# Patient Record
Sex: Female | Born: 1935 | Race: White | Hispanic: No | Smoking: Never smoker
Health system: Southern US, Community
[De-identification: ages and names within clinical notes are randomized; demographics above are authoritative.]

## PROBLEM LIST (undated history)

## (undated) DIAGNOSIS — F0280 Dementia in other diseases classified elsewhere without behavioral disturbance: Secondary | ICD-10-CM

## (undated) DIAGNOSIS — C4330 Malignant melanoma of unspecified part of face: Secondary | ICD-10-CM

## (undated) DIAGNOSIS — C801 Malignant (primary) neoplasm, unspecified: Secondary | ICD-10-CM

## (undated) DIAGNOSIS — R739 Hyperglycemia, unspecified: Secondary | ICD-10-CM

## (undated) DIAGNOSIS — I10 Essential (primary) hypertension: Secondary | ICD-10-CM

## (undated) DIAGNOSIS — F329 Major depressive disorder, single episode, unspecified: Secondary | ICD-10-CM

## (undated) DIAGNOSIS — M545 Low back pain, unspecified: Secondary | ICD-10-CM

## (undated) DIAGNOSIS — G43909 Migraine, unspecified, not intractable, without status migrainosus: Secondary | ICD-10-CM

## (undated) DIAGNOSIS — E039 Hypothyroidism, unspecified: Secondary | ICD-10-CM

## (undated) DIAGNOSIS — E079 Disorder of thyroid, unspecified: Secondary | ICD-10-CM

## (undated) DIAGNOSIS — G309 Alzheimer's disease, unspecified: Secondary | ICD-10-CM

## (undated) DIAGNOSIS — K648 Other hemorrhoids: Secondary | ICD-10-CM

## (undated) DIAGNOSIS — C679 Malignant neoplasm of bladder, unspecified: Secondary | ICD-10-CM

## (undated) DIAGNOSIS — M1990 Unspecified osteoarthritis, unspecified site: Secondary | ICD-10-CM

## (undated) DIAGNOSIS — R42 Dizziness and giddiness: Secondary | ICD-10-CM

## (undated) DIAGNOSIS — E785 Hyperlipidemia, unspecified: Secondary | ICD-10-CM

## (undated) DIAGNOSIS — I4891 Unspecified atrial fibrillation: Secondary | ICD-10-CM

## (undated) DIAGNOSIS — F32A Depression, unspecified: Secondary | ICD-10-CM

## (undated) DIAGNOSIS — F0390 Unspecified dementia without behavioral disturbance: Secondary | ICD-10-CM

## (undated) HISTORY — DX: Unspecified osteoarthritis, unspecified site: M19.90

## (undated) HISTORY — PX: OTHER SURGICAL HISTORY: SHX169

## (undated) HISTORY — DX: Hyperglycemia, unspecified: R73.9

## (undated) HISTORY — PX: EYE SURGERY: SHX253

## (undated) HISTORY — DX: Malignant neoplasm of bladder, unspecified: C67.9

## (undated) HISTORY — DX: Depression, unspecified: F32.A

## (undated) HISTORY — DX: Major depressive disorder, single episode, unspecified: F32.9

## (undated) HISTORY — DX: Hyperlipidemia, unspecified: E78.5

## (undated) HISTORY — PX: BLADDER SURGERY: SHX569

## (undated) HISTORY — DX: Other hemorrhoids: K64.8

## (undated) HISTORY — PX: CHOLECYSTECTOMY: SHX55

## (undated) HISTORY — DX: Alzheimer's disease, unspecified: G30.9

## (undated) HISTORY — DX: Migraine, unspecified, not intractable, without status migrainosus: G43.909

## (undated) HISTORY — PX: MELANOMA EXCISION: SHX5266

## (undated) HISTORY — DX: Dementia in other diseases classified elsewhere, unspecified severity, without behavioral disturbance, psychotic disturbance, mood disturbance, and anxiety: F02.80

---
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
## 2001-09-16 ENCOUNTER — Encounter: Payer: Self-pay | Admitting: Internal Medicine

## 2001-09-16 ENCOUNTER — Ambulatory Visit (HOSPITAL_COMMUNITY): Admission: RE | Admit: 2001-09-16 | Discharge: 2001-09-16 | Payer: Self-pay | Admitting: Internal Medicine

## 2001-12-09 ENCOUNTER — Emergency Department (HOSPITAL_COMMUNITY): Admission: EM | Admit: 2001-12-09 | Discharge: 2001-12-09 | Payer: Self-pay | Admitting: Emergency Medicine

## 2001-12-09 ENCOUNTER — Encounter: Payer: Self-pay | Admitting: Emergency Medicine

## 2002-02-19 ENCOUNTER — Encounter: Payer: Self-pay | Admitting: Internal Medicine

## 2002-02-19 ENCOUNTER — Ambulatory Visit (HOSPITAL_COMMUNITY): Admission: RE | Admit: 2002-02-19 | Discharge: 2002-02-19 | Payer: Self-pay | Admitting: Internal Medicine

## 2002-03-12 ENCOUNTER — Emergency Department (HOSPITAL_COMMUNITY): Admission: EM | Admit: 2002-03-12 | Discharge: 2002-03-12 | Payer: Self-pay | Admitting: Emergency Medicine

## 2002-04-08 ENCOUNTER — Encounter: Payer: Self-pay | Admitting: Neurosurgery

## 2002-04-08 ENCOUNTER — Encounter: Admission: RE | Admit: 2002-04-08 | Discharge: 2002-04-08 | Payer: Self-pay | Admitting: Neurosurgery

## 2002-04-23 ENCOUNTER — Encounter: Admission: RE | Admit: 2002-04-23 | Discharge: 2002-04-23 | Payer: Self-pay | Admitting: Neurosurgery

## 2002-04-23 ENCOUNTER — Encounter: Payer: Self-pay | Admitting: Neurosurgery

## 2002-05-07 ENCOUNTER — Encounter: Payer: Self-pay | Admitting: Neurosurgery

## 2002-05-07 ENCOUNTER — Encounter: Admission: RE | Admit: 2002-05-07 | Discharge: 2002-05-07 | Payer: Self-pay | Admitting: Neurosurgery

## 2003-01-12 ENCOUNTER — Encounter: Admission: RE | Admit: 2003-01-12 | Discharge: 2003-01-12 | Payer: Self-pay | Admitting: Neurosurgery

## 2003-01-12 ENCOUNTER — Encounter: Payer: Self-pay | Admitting: Neurosurgery

## 2003-01-26 ENCOUNTER — Encounter: Admission: RE | Admit: 2003-01-26 | Discharge: 2003-01-26 | Payer: Self-pay | Admitting: Neurosurgery

## 2003-01-26 ENCOUNTER — Encounter: Payer: Self-pay | Admitting: Neurosurgery

## 2003-02-17 ENCOUNTER — Ambulatory Visit (HOSPITAL_COMMUNITY): Admission: RE | Admit: 2003-02-17 | Discharge: 2003-02-17 | Payer: Self-pay | Admitting: Internal Medicine

## 2003-02-17 ENCOUNTER — Encounter: Payer: Self-pay | Admitting: Internal Medicine

## 2003-11-22 ENCOUNTER — Ambulatory Visit (HOSPITAL_COMMUNITY): Admission: RE | Admit: 2003-11-22 | Discharge: 2003-11-22 | Payer: Self-pay | Admitting: Internal Medicine

## 2003-11-22 ENCOUNTER — Encounter: Payer: Self-pay | Admitting: Physician Assistant

## 2005-05-10 ENCOUNTER — Ambulatory Visit (HOSPITAL_COMMUNITY): Admission: RE | Admit: 2005-05-10 | Discharge: 2005-05-10 | Payer: Self-pay | Admitting: Family Medicine

## 2005-10-05 ENCOUNTER — Ambulatory Visit (HOSPITAL_COMMUNITY): Admission: RE | Admit: 2005-10-05 | Discharge: 2005-10-06 | Payer: Self-pay | Admitting: Urology

## 2005-10-05 ENCOUNTER — Encounter (INDEPENDENT_AMBULATORY_CARE_PROVIDER_SITE_OTHER): Payer: Self-pay | Admitting: Specialist

## 2006-08-07 ENCOUNTER — Ambulatory Visit (HOSPITAL_COMMUNITY): Admission: RE | Admit: 2006-08-07 | Discharge: 2006-08-07 | Payer: Self-pay | Admitting: Family Medicine

## 2006-08-16 ENCOUNTER — Ambulatory Visit (HOSPITAL_COMMUNITY): Admission: RE | Admit: 2006-08-16 | Discharge: 2006-08-16 | Payer: Self-pay | Admitting: General Surgery

## 2006-10-08 ENCOUNTER — Ambulatory Visit (HOSPITAL_COMMUNITY): Admission: RE | Admit: 2006-10-08 | Discharge: 2006-10-09 | Payer: Self-pay | Admitting: General Surgery

## 2006-10-08 ENCOUNTER — Encounter (INDEPENDENT_AMBULATORY_CARE_PROVIDER_SITE_OTHER): Payer: Self-pay | Admitting: Specialist

## 2007-08-12 ENCOUNTER — Ambulatory Visit (HOSPITAL_COMMUNITY): Admission: RE | Admit: 2007-08-12 | Discharge: 2007-08-12 | Payer: Self-pay | Admitting: Family Medicine

## 2007-10-04 ENCOUNTER — Emergency Department (HOSPITAL_COMMUNITY): Admission: EM | Admit: 2007-10-04 | Discharge: 2007-10-04 | Payer: Self-pay | Admitting: Emergency Medicine

## 2007-10-17 ENCOUNTER — Ambulatory Visit: Payer: Self-pay | Admitting: Internal Medicine

## 2007-10-17 DIAGNOSIS — E039 Hypothyroidism, unspecified: Secondary | ICD-10-CM | POA: Insufficient documentation

## 2007-10-17 DIAGNOSIS — Z8551 Personal history of malignant neoplasm of bladder: Secondary | ICD-10-CM | POA: Insufficient documentation

## 2007-10-17 DIAGNOSIS — I868 Varicose veins of other specified sites: Secondary | ICD-10-CM

## 2007-10-17 DIAGNOSIS — Z8669 Personal history of other diseases of the nervous system and sense organs: Secondary | ICD-10-CM | POA: Insufficient documentation

## 2007-10-17 DIAGNOSIS — E785 Hyperlipidemia, unspecified: Secondary | ICD-10-CM

## 2007-10-17 DIAGNOSIS — Z85828 Personal history of other malignant neoplasm of skin: Secondary | ICD-10-CM | POA: Insufficient documentation

## 2007-10-17 DIAGNOSIS — R011 Cardiac murmur, unspecified: Secondary | ICD-10-CM

## 2007-10-17 DIAGNOSIS — J45909 Unspecified asthma, uncomplicated: Secondary | ICD-10-CM | POA: Insufficient documentation

## 2007-10-17 DIAGNOSIS — F411 Generalized anxiety disorder: Secondary | ICD-10-CM | POA: Insufficient documentation

## 2007-10-17 DIAGNOSIS — N318 Other neuromuscular dysfunction of bladder: Secondary | ICD-10-CM

## 2007-10-17 DIAGNOSIS — I10 Essential (primary) hypertension: Secondary | ICD-10-CM | POA: Insufficient documentation

## 2007-10-17 DIAGNOSIS — F329 Major depressive disorder, single episode, unspecified: Secondary | ICD-10-CM

## 2007-11-14 ENCOUNTER — Ambulatory Visit: Payer: Self-pay | Admitting: Internal Medicine

## 2007-11-19 ENCOUNTER — Encounter (INDEPENDENT_AMBULATORY_CARE_PROVIDER_SITE_OTHER): Payer: Self-pay | Admitting: Internal Medicine

## 2007-12-25 ENCOUNTER — Ambulatory Visit: Payer: Self-pay | Admitting: Internal Medicine

## 2007-12-25 DIAGNOSIS — G43909 Migraine, unspecified, not intractable, without status migrainosus: Secondary | ICD-10-CM | POA: Insufficient documentation

## 2008-02-23 ENCOUNTER — Ambulatory Visit: Payer: Self-pay | Admitting: Internal Medicine

## 2008-02-23 LAB — CONVERTED CEMR LAB
Bilirubin Urine: NEGATIVE
Blood in Urine, dipstick: NEGATIVE
Glucose, Urine, Semiquant: NEGATIVE
Ketones, urine, test strip: NEGATIVE
Nitrite: NEGATIVE

## 2008-02-24 LAB — CONVERTED CEMR LAB
Albumin: 4.2 g/dL (ref 3.5–5.2)
CO2: 23 meq/L (ref 19–32)
Cholesterol: 164 mg/dL (ref 0–200)
Creatinine, Ser: 0.88 mg/dL (ref 0.40–1.20)
Eosinophils Absolute: 0.1 10*3/uL (ref 0.0–0.7)
Glucose, Bld: 126 mg/dL — ABNORMAL HIGH (ref 70–99)
HDL: 47 mg/dL (ref >39)
Hemoglobin: 13.3 g/dL (ref 12.0–15.0)
Lymphocytes Relative: 29 % (ref 12–46)
Lymphs Abs: 1.6 10*3/uL (ref 0.7–4.0)
MCHC: 32.8 g/dL (ref 30.0–36.0)
MCV: 88.8 fL (ref 78.0–100.0)
Monocytes Relative: 7 % (ref 3–12)
Platelets: 196 10*3/uL (ref 150–400)
RDW: 13.5 % (ref 11.5–15.5)
Sodium: 141 meq/L (ref 135–145)
Total Bilirubin: 0.7 mg/dL (ref 0.3–1.2)
Total CHOL/HDL Ratio: 3.5
Total Protein: 7.3 g/dL (ref 6.0–8.3)
Triglycerides: 212 mg/dL — ABNORMAL HIGH (ref <150)
WBC: 5.6 10*3/uL (ref 4.0–10.5)

## 2008-03-15 ENCOUNTER — Encounter (INDEPENDENT_AMBULATORY_CARE_PROVIDER_SITE_OTHER): Payer: Self-pay | Admitting: Internal Medicine

## 2008-03-19 LAB — CONVERTED CEMR LAB: Glucose, Bld: 149 mg/dL — ABNORMAL HIGH (ref 70–99)

## 2008-03-25 ENCOUNTER — Ambulatory Visit: Payer: Self-pay | Admitting: Internal Medicine

## 2008-03-25 DIAGNOSIS — E119 Type 2 diabetes mellitus without complications: Secondary | ICD-10-CM

## 2008-03-25 LAB — CONVERTED CEMR LAB: Hgb A1c MFr Bld: 6.4 %

## 2008-04-01 ENCOUNTER — Encounter (INDEPENDENT_AMBULATORY_CARE_PROVIDER_SITE_OTHER): Payer: Self-pay | Admitting: Internal Medicine

## 2008-05-06 ENCOUNTER — Ambulatory Visit: Payer: Self-pay | Admitting: Internal Medicine

## 2008-06-17 ENCOUNTER — Ambulatory Visit: Payer: Self-pay | Admitting: Internal Medicine

## 2008-06-17 LAB — CONVERTED CEMR LAB
Creatinine, Urine: 46.4 mg/dL
Hgb A1c MFr Bld: 6.2 %

## 2008-08-29 ENCOUNTER — Telehealth (INDEPENDENT_AMBULATORY_CARE_PROVIDER_SITE_OTHER): Payer: Self-pay | Admitting: Internal Medicine

## 2008-08-30 ENCOUNTER — Ambulatory Visit: Payer: Self-pay | Admitting: Internal Medicine

## 2008-08-30 DIAGNOSIS — K6289 Other specified diseases of anus and rectum: Secondary | ICD-10-CM | POA: Insufficient documentation

## 2008-09-16 ENCOUNTER — Ambulatory Visit: Payer: Self-pay | Admitting: Internal Medicine

## 2008-09-16 DIAGNOSIS — F068 Other specified mental disorders due to known physiological condition: Secondary | ICD-10-CM

## 2008-09-16 LAB — CONVERTED CEMR LAB
Blood Glucose, Fingerstick: 108
Hgb A1c MFr Bld: 6.5 %

## 2008-09-17 LAB — CONVERTED CEMR LAB
ALT: 19 units/L (ref 0–35)
AST: 22 units/L (ref 0–37)
BUN: 21 mg/dL (ref 6–23)
Creatinine, Ser: 0.97 mg/dL (ref 0.40–1.20)

## 2008-11-12 ENCOUNTER — Telehealth (INDEPENDENT_AMBULATORY_CARE_PROVIDER_SITE_OTHER): Payer: Self-pay | Admitting: Internal Medicine

## 2008-12-14 ENCOUNTER — Ambulatory Visit: Payer: Self-pay | Admitting: Internal Medicine

## 2009-02-10 ENCOUNTER — Ambulatory Visit: Payer: Self-pay | Admitting: Internal Medicine

## 2009-02-10 DIAGNOSIS — N760 Acute vaginitis: Secondary | ICD-10-CM | POA: Insufficient documentation

## 2009-02-11 ENCOUNTER — Encounter (INDEPENDENT_AMBULATORY_CARE_PROVIDER_SITE_OTHER): Payer: Self-pay | Admitting: Internal Medicine

## 2009-02-16 ENCOUNTER — Telehealth (INDEPENDENT_AMBULATORY_CARE_PROVIDER_SITE_OTHER): Payer: Self-pay | Admitting: *Deleted

## 2009-02-28 ENCOUNTER — Other Ambulatory Visit: Admission: RE | Admit: 2009-02-28 | Discharge: 2009-02-28 | Payer: Self-pay | Admitting: Obstetrics and Gynecology

## 2009-03-16 ENCOUNTER — Ambulatory Visit: Payer: Self-pay | Admitting: Internal Medicine

## 2009-03-16 LAB — CONVERTED CEMR LAB: Hgb A1c MFr Bld: 5.8 %

## 2009-03-30 ENCOUNTER — Encounter (INDEPENDENT_AMBULATORY_CARE_PROVIDER_SITE_OTHER): Payer: Self-pay | Admitting: Internal Medicine

## 2009-03-31 LAB — CONVERTED CEMR LAB
AST: 23 units/L (ref 0–37)
Albumin: 4.3 g/dL (ref 3.5–5.2)
Alkaline Phosphatase: 61 units/L (ref 39–117)
BUN: 13 mg/dL (ref 6–23)
Calcium: 9.8 mg/dL (ref 8.4–10.5)
Chloride: 102 meq/L (ref 96–112)
Creatinine, Ser: 0.94 mg/dL (ref 0.40–1.20)
Free T4: 1.06 ng/dL (ref 0.80–1.80)
Glucose, Bld: 158 mg/dL — ABNORMAL HIGH (ref 70–99)
HDL: 48 mg/dL (ref >39)
Potassium: 4.4 meq/L (ref 3.5–5.3)
TSH: 1.642 microintl units/mL (ref 0.350–4.500)
Total Protein: 7.5 g/dL (ref 6.0–8.3)
Triglycerides: 128 mg/dL (ref <150)
VLDL: 26 mg/dL (ref 0–40)

## 2009-04-15 ENCOUNTER — Encounter (INDEPENDENT_AMBULATORY_CARE_PROVIDER_SITE_OTHER): Payer: Self-pay | Admitting: Internal Medicine

## 2009-05-16 ENCOUNTER — Ambulatory Visit (HOSPITAL_COMMUNITY): Admission: RE | Admit: 2009-05-16 | Discharge: 2009-05-16 | Payer: Self-pay | Admitting: Family Medicine

## 2009-10-25 ENCOUNTER — Encounter: Payer: Self-pay | Admitting: Gastroenterology

## 2009-10-27 ENCOUNTER — Encounter: Payer: Self-pay | Admitting: Gastroenterology

## 2009-11-21 ENCOUNTER — Ambulatory Visit: Payer: Self-pay | Admitting: Gastroenterology

## 2009-11-21 ENCOUNTER — Ambulatory Visit (HOSPITAL_COMMUNITY): Admission: RE | Admit: 2009-11-21 | Discharge: 2009-11-21 | Payer: Self-pay | Admitting: Gastroenterology

## 2009-11-21 HISTORY — PX: COLONOSCOPY: SHX174

## 2010-09-05 NOTE — Letter (Signed)
Summary: Internal Other/triage  Internal Other/triage   Imported By: Cloria Spring LPN 45/40/9811 91:47:82  _____________________________________________________________________  External Attachment:    Type:   Image     Comment:   External Document

## 2010-09-05 NOTE — Procedures (Signed)
Summary: Gastroenterology  Gastroenterology   Imported By: Lind Guest 10/17/2009 16:00:02  _____________________________________________________________________  External Attachment:    Type:   Image     Comment:   External Document

## 2010-09-05 NOTE — Letter (Signed)
Summary: Internal Other Domingo Dimes  Internal Other Domingo Dimes   Imported By: Cloria Spring LPN 16/05/9603 54:09:81  _____________________________________________________________________  External Attachment:    Type:   Image     Comment:   External Document

## 2010-11-17 ENCOUNTER — Other Ambulatory Visit (HOSPITAL_COMMUNITY): Payer: Self-pay | Admitting: Family Medicine

## 2010-11-17 DIAGNOSIS — G3184 Mild cognitive impairment, so stated: Secondary | ICD-10-CM

## 2010-11-22 ENCOUNTER — Ambulatory Visit (HOSPITAL_COMMUNITY)
Admission: RE | Admit: 2010-11-22 | Discharge: 2010-11-22 | Disposition: A | Discharge status: Home / Self Care | Payer: Medicare Other | Source: Ambulatory Visit | Attending: Family Medicine | Admitting: Family Medicine

## 2010-11-22 DIAGNOSIS — C679 Malignant neoplasm of bladder, unspecified: Secondary | ICD-10-CM | POA: Insufficient documentation

## 2010-11-22 DIAGNOSIS — R51 Headache: Secondary | ICD-10-CM | POA: Insufficient documentation

## 2010-11-22 DIAGNOSIS — G3184 Mild cognitive impairment, so stated: Secondary | ICD-10-CM

## 2010-11-22 DIAGNOSIS — R4182 Altered mental status, unspecified: Secondary | ICD-10-CM | POA: Insufficient documentation

## 2010-12-22 NOTE — Op Note (Signed)
[name], ALL                 ACCOUNT NO.:  [id number]   MEDICAL RECORD NO.:  [id number]          PATIENT TYPE:  AMB   LOCATION:  DAY                          FACILITY:  [facility]   PHYSICIAN:  Adolph Pollack, M.D.DATE OF BIRTH:  01-22-1930   DATE OF PROCEDURE:  DATE OF DISCHARGE:                               OPERATIVE REPORT   PREOP DIAGNOSIS:  Symptomatic cholelithiasis.   POSTOPERATIVE DIAGNOSIS:  Symptomatic cholelithiasis.   PROCEDURE:  Laparoscopic cholecystectomy with intraoperative  cholangiogram.   SURGEON:  Adolph Pollack, M.D.   ASSISTANT:  Sheppard Plumber. Earlene Plater, M.D.   ANESTHESIA:  General.   INDICATIONS:  This is a 77 year old female who after the winter holidays  has been having some pain in the left flank, but also epigastric region,  and sometimes with pain in the subscapular regions.  It radiates around  to the epigastric region with it begins in the subscapular region and  begins after meals.  She has a family history of gallbladder problems.  She underwent an ultrasound at her internal medicine physician's office;  and had gallstones.  She now presents for elective cholecystectomy.  The  procedure and the risks were discussed with her preoperatively.   TECHNIQUE:  She is seen in the holding area and brought to the operating  room, and placed supine on the operating table and a general anesthetic  was administered.  The abdominal wall was sterilely prepped and draped.  Marcaine was infiltrated in the subumbilical region.  A subumbilical  incision was made through the skin, subcutaneous tissue, fascia, and  peritoneum entering the peritoneal cavity under direct vision.  A  pursestring suture of #0 Vicryl was placed around the fascial edges.  A  Hassan trocar was introduced into the peritoneal cavity and a  pneumoperitoneum was created by insufflation of CO2 gas.   The laparoscope was introduced into the abdominal cavity; and she was  placed in the  reverse Trendelenburg position with the right side tilted  slightly up.  An 11-mm trocar was placed through an epigastric incision;  and two 5-mm trocars were placed in right mid abdomen.  The fundus of  the gallbladder was grasped.  No acute inflammatory changes were noted.  The fundus was then retracted to the right shoulder.   The infundibulum was grasped and retracted laterally.  Using careful  blunt dissection on the gallbladder, I mobilized the infundibulum.  I  isolated the cystic duct and created a window around it.  I also widely  isolated an anterior branch of the cystic artery and created a window  around it.  The anterior branch of the cystic artery was clipped and  divided.  A clip was placed just above the gallbladder cystic duct  junction.  An incision was made at the gallbladder cystic duct junction  with bile coming out.  A cholangiocath was passed through the anterior  abdominal wall and placed into the cystic duct and a cholangiogram was  performed.   Under real time fluoroscopy dilute contrast was injected into the cystic  duct which is of long length.  The common hepatic, right-and-left  hepatic, and common bile ducts all filled promptly; and contrast drained  promptly into the duodenum without obvious evidence of obstruction.  The  final report is pending the radiologist's interpretation.   The cholangiocatheter was removed, the cystic duct was clipped 3x  proximally and divided.  A posterior cystic artery was identified,  clipped, and divided.  The gallbladder was dissected free from the liver  bed using electrocautery.  There is a mild small amount of bile  spillage.  The gallbladder was placed in an Endopouch bag.   The gallbladder fossa was irrigated and bleeding points controlled with  electrocautery.  It was irrigated, again, and inspected two more times  and no bleeding or bile leak was noted.  Surgicel was placed in the  gallbladder fossa.  The irrigation  fluid was evacuated.   The gallbladder was removed through the subumbilical port, in the bag;  and was noted have large stones in it.  The subumbilical fascial defect  was closed under laparoscopic vision by tightening up and tying down the  pursestring suture.  The remaining trocars removed, and a  pneumoperitoneum was released.   The skin incisions were closed with 4-0 Monocryl subcuticular stitches.  Steri-Strips and sterile dressings were applied.  She tolerated the  procedure well without any apparent complications; and was taken to the  recovery room in satisfactory condition.      Adolph Pollack, M.D.  Electronically Signed     TJR/MEDQ  D:  10/08/2006  T:  10/08/2006  Job:  [id number]   cc:   Melvyn Novas, MD  Fax: [phone]

## 2010-12-22 NOTE — Op Note (Signed)
[name], [name]                           ACCOUNT NO.:  [id number]   MEDICAL RECORD NO.:  [id number]                   PATIENT TYPE:  AMB   LOCATION:  DAY                                  FACILITY:  [facility]   PHYSICIAN:  Lionel December, M.D.                 DATE OF BIRTH:  06-03-1930   DATE OF PROCEDURE:  11/22/2003  DATE OF DISCHARGE:                                 OPERATIVE REPORT   PROCEDURE:  Total colonoscopy with polypectomy.   ENDOSCOPIST:  Lionel December, M.D.   INDICATIONS:  This patient is a 76 year old Caucasian female who is  undergoing screening colonoscopy.  The family history is negative for  colorectal carcinoma.  The procedure and risks were reviewed with the  patient and informed consent was obtained.   PREOPERATIVE MEDICATIONS:  Demerol 25 mg IV and Versed 3 mg IV in divided  dose.   FINDINGS:  Procedure performed in endoscopy suite.  The patient's vital  signs and O2 saturation were monitored during the procedure and remained  stable.  The patient was placed in the left lateral recumbent position and  rectal examination was performed.  No abnormality noted on external or  digital exam.   Olympus videoscope was placed in the rectum and advanced under vision into  the sigmoid colon and beyond.  Preparation was satisfactory.  Scattered  small diverticula were noted at sigmoid colon along with a pedunculated  polyp which was felt on the way out.  The scope was passed to the cecum  which was identified by appendiceal orifice and the ileocecal valve.  Pictures were taken for the record.  As the scope was withdrawn the colonic  mucosa was carefully examined.  There was a 10-12 mm pedunculated polyp at  the midsigmoid colon which was snared and retrieved for histologic  examination.  The scope was passed again, to examine the distal sigmoid  colon and the rectum which was normal.   The scope was retroflexed to examine the anorectal junction which was  unremarkable.  The endoscope was straightened and withdrawn.  The patient  tolerated the procedure well.   FINAL DIAGNOSES:  1. A 10-to-12-mm, pedunculated polyp at the sigmoid colon which was snared.  2. Sigmoid colon diverticulosis.   RECOMMENDATIONS:  1. Standard instructions given.  2. High fiber diet.  3. Citrucel 1 tablespoonful daily.  4. I will be contacting the patient with the biopsy results and further     recommendations.      ___________________________________________                                            Lionel December, M.D.   NR/MEDQ  D:  11/22/2003  T:  11/22/2003  Job:  [id number]   cc:   Melvyn Novas,  MD  Fax: [phone]

## 2010-12-22 NOTE — Op Note (Signed)
NAMENolie, Bignell Mekisha                 ACCOUNT NO.:  [id number]   MEDICAL RECORD NO.:  [id number]          PATIENT TYPE:  OIB   LOCATION:  1409                         FACILITY:  [facility]   PHYSICIAN:  Ronald L. Earlene Plater, M.D.  DATE OF BIRTH:  January 20, 1930   DATE OF PROCEDURE:  10/05/2005  DATE OF DISCHARGE:  10/06/2005                                 OPERATIVE REPORT   PREOPERATIVE DIAGNOSIS:  Bladder tumor.   POSTOPERATIVE DIAGNOSIS:  Bladder tumor.   PROCEDURE:  Cystoscopy and transurethral resection of bladder tumor  (medium).   ANESTHESIA:  General.   INDICATIONS:  This is a 77 year old lady who was noted to have continued  lower urinary tract symptoms and lower abdominal pain.  Office cystoscopy  revealed left bladder tumor, close to the left ureteral orifice.  At this  point, TURBT is indicated.   DESCRIPTION OF PROCEDURE:  The patient was brought to the operating room.  General anesthesia was induced.  The patient was placed into the supine  position.  She was prepped and draped in a normal sterile fashion.  Preoperative antibiotics were given.  The stethoscope was then inserted into  the bladder, and the bladder was then pan-cystoscoped with no evidence of  masses, lesions or stones except for a 3 cm papillary tumor noted about 0.5  cm cephalad and 0.5 cm lateral to the left ureteral orifice.  Otherwise,  both ureteral orifices were effluxing urine normally.  Resection of the  tumor was then commenced.  Excessive care was taken to protect the left  ureteral orifice.  The tumor was then resected and sent for pathology.  Care  was taken to include muscle specimen without perforating the bladder.  After  resection, adequate hemostasis was obtained.  The resectoscope was then  taken out, and a whole catheter was then inserted.  Urine was noted to be  clear at the end of the procedure.  At this point, the patient was taken  __________ in the supine position, and the procedure  terminated.  No  complications.  The patient was extubated in the operating room and taken in  stable condition to PACU.   Dr. Earlene Plater was present and participated in the entire procedure, as he was  the responsible surgeon.     ______________________________  Glade Nurse, MD      Lucrezia Starch. Earlene Plater, M.D.  Electronically Signed    MT/MEDQ  D:  10/06/2005  T:  10/07/2005  Job:  [id number]

## 2011-01-09 DIAGNOSIS — C4330 Malignant melanoma of unspecified part of face: Secondary | ICD-10-CM

## 2011-01-09 HISTORY — DX: Malignant melanoma of unspecified part of face: C43.30

## 2011-04-03 ENCOUNTER — Emergency Department (HOSPITAL_COMMUNITY): Payer: Medicare Other

## 2011-04-03 ENCOUNTER — Emergency Department (HOSPITAL_COMMUNITY)
Admission: EM | Admit: 2011-04-03 | Discharge: 2011-04-03 | Disposition: A | Discharge status: Home / Self Care | Payer: Medicare Other | Attending: Emergency Medicine | Admitting: Emergency Medicine

## 2011-04-03 ENCOUNTER — Other Ambulatory Visit: Payer: Self-pay

## 2011-04-03 DIAGNOSIS — H81399 Other peripheral vertigo, unspecified ear: Secondary | ICD-10-CM | POA: Insufficient documentation

## 2011-04-03 DIAGNOSIS — I10 Essential (primary) hypertension: Secondary | ICD-10-CM | POA: Insufficient documentation

## 2011-04-03 DIAGNOSIS — R42 Dizziness and giddiness: Secondary | ICD-10-CM | POA: Insufficient documentation

## 2011-04-03 DIAGNOSIS — E079 Disorder of thyroid, unspecified: Secondary | ICD-10-CM | POA: Insufficient documentation

## 2011-04-03 DIAGNOSIS — R1110 Vomiting, unspecified: Secondary | ICD-10-CM | POA: Insufficient documentation

## 2011-04-03 HISTORY — DX: Disorder of thyroid, unspecified: E07.9

## 2011-04-03 HISTORY — DX: Unspecified dementia, unspecified severity, without behavioral disturbance, psychotic disturbance, mood disturbance, and anxiety: F03.90

## 2011-04-03 HISTORY — DX: Essential (primary) hypertension: I10

## 2011-04-03 HISTORY — DX: Low back pain: M54.5

## 2011-04-03 HISTORY — DX: Dizziness and giddiness: R42

## 2011-04-03 HISTORY — DX: Low back pain, unspecified: M54.50

## 2011-04-03 LAB — DIFFERENTIAL
Basophils Absolute: 0 10*3/uL (ref 0.0–0.1)
Eosinophils Absolute: 0.1 10*3/uL (ref 0.0–0.7)
Lymphocytes Relative: 30 % (ref 12–46)
Lymphs Abs: 1.9 10*3/uL (ref 0.7–4.0)
Monocytes Absolute: 0.4 10*3/uL (ref 0.1–1.0)
Monocytes Relative: 6 % (ref 3–12)
Neutrophils Relative %: 62 % (ref 43–77)

## 2011-04-03 LAB — CBC
Hemoglobin: 13.1 g/dL (ref 12.0–15.0)
MCHC: 34.3 g/dL (ref 30.0–36.0)
MCV: 86.2 fL (ref 78.0–100.0)
RBC: 4.43 MIL/uL (ref 3.87–5.11)
WBC: 6.2 10*3/uL (ref 4.0–10.5)

## 2011-04-03 LAB — BASIC METABOLIC PANEL
CO2: 25 mEq/L (ref 19–32)
Chloride: 95 mEq/L — ABNORMAL LOW (ref 96–112)
GFR calc non Af Amer: >60 mL/min (ref >60)
Glucose, Bld: 135 mg/dL — ABNORMAL HIGH (ref 70–99)

## 2011-04-03 LAB — HEPATIC FUNCTION PANEL
ALT: 24 U/L (ref 0–35)
AST: 26 U/L (ref 0–37)
Albumin: 4 g/dL (ref 3.5–5.2)
Bilirubin, Direct: 0.1 mg/dL (ref 0.0–0.3)
Total Protein: 7.3 g/dL (ref 6.0–8.3)

## 2011-04-03 LAB — URINALYSIS, ROUTINE W REFLEX MICROSCOPIC
Nitrite: NEGATIVE
pH: 8.5 — ABNORMAL HIGH (ref 5.0–8.0)

## 2011-04-03 LAB — LIPASE, BLOOD: Lipase: 30 U/L (ref 11–59)

## 2011-04-03 LAB — POCT I-STAT TROPONIN I

## 2011-04-03 MED ORDER — MECLIZINE HCL 25 MG PO TABS: 25.0000 mg | ORAL_TABLET | Freq: Three times a day (TID) | ORAL | Status: AC | PRN

## 2011-04-03 MED ORDER — ONDANSETRON HCL 4 MG/2ML IJ SOLN
4.0000 mg | INTRAMUSCULAR | Status: DC | PRN
  Administered 2011-04-03: 4 mg via INTRAVENOUS

## 2011-04-03 MED ORDER — POTASSIUM CHLORIDE 20 MEQ PO PACK
40.0000 meq | PACK | Freq: Once | ORAL | Status: AC
  Administered 2011-04-03: 40 meq via ORAL
  Filled 2011-04-03: qty 2

## 2011-04-03 MED ORDER — DIAZEPAM 5 MG PO TABS
ORAL_TABLET | ORAL | Status: AC
  Administered 2011-04-03: 2.5 mg via ORAL
  Filled 2011-04-03: qty 1

## 2011-04-03 MED ORDER — ONDANSETRON HCL 4 MG/2ML IJ SOLN
4.0000 mg | INTRAMUSCULAR | Status: DC | PRN
  Administered 2011-04-03: 4 mg via INTRAVENOUS
  Filled 2011-04-03 (×2): qty 2

## 2011-04-03 MED ORDER — ONDANSETRON HCL 4 MG PO TABS: 4.0000 mg | ORAL_TABLET | Freq: Four times a day (QID) | ORAL | Status: AC

## 2011-04-03 MED ORDER — DIAZEPAM 5 MG/ML IJ SOLN: 2.5000 mg | Freq: Once | INTRAMUSCULAR | Status: DC

## 2011-04-03 MED ORDER — MECLIZINE HCL 12.5 MG PO TABS
25.0000 mg | ORAL_TABLET | Freq: Once | ORAL | Status: AC
  Administered 2011-04-03: 25 mg via ORAL
  Filled 2011-04-03: qty 2

## 2011-04-03 MED ORDER — SODIUM CHLORIDE 0.9 % IV SOLN: INTRAVENOUS | Status: DC

## 2011-04-03 MED ORDER — DIAZEPAM 2 MG PO TABS
2.0000 mg | ORAL_TABLET | Freq: Once | ORAL | Status: AC
  Administered 2011-04-03: 2.5 mg via ORAL

## 2011-04-03 NOTE — ED Provider Notes (Signed)
History     CSN: [id number] Arrival date & time: 04/03/2011  5:04 PM  Chief Complaint  Patient presents with  . Emesis   HPI Pt was seen at 1740.  Per pt and family, c/o sudden onset and persistence of constant "dizziness" since waking up this morning.  Pt describes her symptoms as "everything is moving around," and feeling "off balance" while walking.  Has been assoc with several episodes of N/V.  Endorses hx of same, dx with vertigo.  Pt also c/o gradual onset and resolution of several episodes of "loose stools" today that began after she took her colchicine.  Denies visual changes, no fevers, no focal motor weakness, no tingling/numbness in extremities, no CP/palpitations, no SOB/cough, no abd pain, no black or blood in stools or emesis.    Past Medical History  Diagnosis Date  . Thyroid disease   . Hypertension   . Gout   . Dementia   . Vertigo   . Low back pain     Past Surgical History  Procedure Date  . Cholecystectomy     History reviewed. No pertinent family history.  History  Substance Use Topics  . Smoking status: Never Smoker   . Smokeless tobacco: Not on file  . Alcohol Use: No    Review of Systems ROS: Statement: All systems negative except as marked or noted in the HPI; Constitutional: Negative for fever and chills. ; ; Eyes: Negative for eye pain, redness and discharge. ; ; ENMT: Negative for ear pain, hoarseness, nasal congestion, sinus pressure and sore throat. ; ; Cardiovascular: Negative for chest pain, palpitations, diaphoresis, dyspnea and peripheral edema. ; ; Respiratory: Negative for cough, wheezing and stridor. ; ; Gastrointestinal: +N/V, "loose stools."  Negative for abdominal pain, blood in stool, hematemesis, jaundice and rectal bleeding. . ; ; Genitourinary: Negative for dysuria, flank pain and hematuria. ; ; Musculoskeletal: Negative for back pain and neck pain. Negative for swelling and trauma.; ; Skin: Negative for pruritus, rash, abrasions,  blisters, bruising and skin lesion.; ; Neuro: Negative for headache, lightheadedness and neck stiffness. +vertigo.  Negative for altered level of consciousness , altered mental status, extremity weakness, paresthesias, involuntary movement, seizure, near syncope and syncope.     Physical Exam  BP 161/84  Pulse 68  Temp(Src) 97.2 F (36.2 C) (Oral)  Resp 18  Ht 5\' 4"  (1.626 m)  Wt 160 lb (72.576 kg)  BMI 27.46 kg/m2  SpO2 99% Patient Vitals for the past 24 hrs:  BP Temp Temp src Pulse Resp SpO2  04/03/11 1930 157/70 mmHg - - 61  - 100 %  04/03/11 1915 158/74 mmHg - - 60  - 100 %  04/03/11 1903 151/83 mmHg - - 62  20  100 %  04/03/11 1701 161/84 mmHg 97.2 F (36.2 C) Oral 68  18  99 %    Physical Exam 1745: Physical examination:  Nursing notes reviewed; Vital signs and O2 SAT reviewed;  Constitutional: Well developed, Well nourished, Well hydrated, In no acute distress; Head:  Normocephalic, atraumatic; Eyes: EOMI, PERRL, No scleral icterus; ENMT: Mouth and pharynx normal, Mucous membranes moist; TM's clear bilat. Neck: Supple, Full range of motion, No lymphadenopathy; Cardiovascular: Regular rate and rhythm, No murmur, rub, or gallop; Respiratory: Breath sounds clear & equal bilaterally, No rales, rhonchi, wheezes, or rub, Normal respiratory effort/excursion; Chest: Nontender, Movement normal; Abdomen: Soft, Nontender, Nondistended, Normal bowel sounds; Genitourinary: No CVA tenderness; Extremities: Pulses normal, No tenderness, No edema, No calf edema or asymmetry.;  Neuro: AA&Ox3, Major CN grossly intact.  Speech clear, no facial droop.  No drift x4 ext.  +left gaze fatigueable nystagmus which reproduces pt's symptoms.  Normal coordination.  No gross focal motor or sensory deficits in extremities.; Skin: Color normal, Warm, Dry.    ED Course  Procedures  MDM MDM Reviewed: nursing note, vitals and previous chart Reviewed previous: ECG Interpretation: ECG, labs, CT scan and  x-ray    Date: 04/03/2011  Rate: 63  Rhythm: normal sinus rhythm  QRS Axis: normal  Intervals: normal  ST/T Wave abnormalities: normal  Conduction Disutrbances:none  Narrative Interpretation:   Old EKG Reviewed: unchanged; no significant changes from previous EKG dated 10/04/2006   Results for orders placed during the hospital encounter of 04/03/11  BASIC METABOLIC PANEL      Component Value Range   Sodium 133 (*) 135 - 145 (mEq/L)   Potassium 3.2 (*) 3.5 - 5.1 (mEq/L)   Chloride 95 (*) 96 - 112 (mEq/L)   CO2 25  19 - 32 (mEq/L)   Glucose, Bld 135 (*) 70 - 99 (mg/dL)   BUN 13  6 - 23 (mg/dL)   Creatinine, Ser 1.61  0.50 - 1.10 (mg/dL)   Calcium 09.6  8.4 - 10.5 (mg/dL)   GFR calc non Af Amer >60  >60 (mL/min)   GFR calc Af Amer >60  >60 (mL/min)  CBC      Component Value Range   WBC 6.2  4.0 - 10.5 (K/uL)   RBC 4.43  3.87 - 5.11 (MIL/uL)   Hemoglobin 13.1  12.0 - 15.0 (g/dL)   HCT 04.5  40.9 - 81.1 (%)   MCV 86.2  78.0 - 100.0 (fL)   MCH 29.6  26.0 - 34.0 (pg)   MCHC 34.3  30.0 - 36.0 (g/dL)   RDW 91.4  78.2 - 95.6 (%)   Platelets 187  150 - 400 (K/uL)  DIFFERENTIAL      Component Value Range   Neutrophils Relative 62  43 - 77 (%)   Neutro Abs 3.8  1.7 - 7.7 (K/uL)   Lymphocytes Relative 30  12 - 46 (%)   Lymphs Abs 1.9  0.7 - 4.0 (K/uL)   Monocytes Relative 6  3 - 12 (%)   Monocytes Absolute 0.4  0.1 - 1.0 (K/uL)   Eosinophils Relative 1  0 - 5 (%)   Eosinophils Absolute 0.1  0.0 - 0.7 (K/uL)   Basophils Relative 1  0 - 1 (%)   Basophils Absolute 0.0  0.0 - 0.1 (K/uL)  URINALYSIS, ROUTINE W REFLEX MICROSCOPIC      Component Value Range   Color, Urine YELLOW  YELLOW    Appearance HAZY (*) CLEAR    Specific Gravity, Urine 1.020  1.005 - 1.030    pH 8.5 (*) 5.0 - 8.0    Glucose, UA NEGATIVE  NEGATIVE (mg/dL)   Hgb urine dipstick NEGATIVE  NEGATIVE    Bilirubin Urine NEGATIVE  NEGATIVE    Ketones, ur 15 (*) NEGATIVE (mg/dL)   Protein, ur NEGATIVE  NEGATIVE  (mg/dL)   Urobilinogen, UA 0.2  0.0 - 1.0 (mg/dL)   Nitrite NEGATIVE  NEGATIVE    Leukocytes, UA NEGATIVE  NEGATIVE   POCT I-STAT TROPONIN I      Component Value Range   Troponin i, poc 0.00  0.00 - 0.08 (ng/mL)   Comment 3           LIPASE, BLOOD      Component Value  Range   Lipase 30  11 - 59 (U/L)  HEPATIC FUNCTION PANEL      Component Value Range   Total Protein 7.3  6.0 - 8.3 (g/dL)   Albumin 4.0  3.5 - 5.2 (g/dL)   AST 26  0 - 37 (U/L)   ALT 24  0 - 35 (U/L)   Alkaline Phosphatase 69  39 - 117 (U/L)   Total Bilirubin 0.5  0.3 - 1.2 (mg/dL)   Bilirubin, Direct 0.1  0.0 - 0.3 (mg/dL)   Indirect Bilirubin 0.4  0.3 - 0.9 (mg/dL)   Dg Chest 1 View  1/61/0960  *RADIOLOGY REPORT*  Clinical Data: Dizziness, weakness and nausea.  CHEST - 1 VIEW  Comparison: Chest x-ray 10/04/2006.  Findings: The cardiac silhouette, mediastinal and hilar contours are within normal limits and stable.  Tortuosity and ectasia of the thoracic aorta is again noted.  The lungs are clear.  The bony thorax is intact.  IMPRESSION: No acute cardiopulmonary findings.  Original Report Authenticated By: P. Loralie Champagne, M.D.   Ct Head Wo Contrast  04/03/2011  *RADIOLOGY REPORT*  Clinical Data: Vertigo.  Dimension.  CT HEAD WITHOUT CONTRAST  Technique:  Contiguous axial images were obtained from the base of the skull through the vertex without contrast.  Comparison: Head CT 11/1978 2012.  Findings: Stable age related cerebral atrophy, ventriculomegaly and periventricular white matter disease.  No extra-axial fluid collections.  No CT findings for acute hemispheric infarction and/or intracranial hemorrhage.  No mass lesions.  The brainstem and cerebellum are grossly normal and stable.  The bony calvarium is intact.  The paranasal sinuses and mastoid air cells are clear.  The globes are intact.  IMPRESSION: Age related cerebral atrophy, ventriculomegaly and periventricular white matter disease. No acute intracranial  findings or mass lesions.  Original Report Authenticated By: P. Loralie Champagne, M.D.    Medications  0.9 %  sodium chloride infusion (  Intravenous New Bag 04/03/11 1819)  ondansetron (ZOFRAN) injection 4 mg (4 mg Intravenous Given 04/03/11 1821)  ondansetron (ZOFRAN) injection 4 mg (4 mg Intravenous Given 04/03/11 1925)  meclizine (ANTIVERT) tablet 25 mg (25 mg Oral Given 04/03/11 1822)  potassium chloride (KLOR-CON) packet 40 mEq (40 mEq Oral Given 04/03/11 1859)  diazepam (VALIUM) tablet 2 mg (2.5 mg Oral Given 04/03/11 1926)    8:14 PM:  Potassium repleted PO.  Pt feels improved after meds.  Ambulatory with steady gait and easy resps to the bathroom.  Has tol PO well while in ED without N/V.  No vomiting/diarrhea during ED visit.  Continues to deny CP/SOB/abd pain.  VS remain stable, neuro exam non-focal and unchanged.  Wants to go home now.  Family will stay with pt tonight.  Pt will hold colchicine d/t loose stools today, consult her PMD tomorrow regarding when to restart.  Dx testing d/w pt and family.  Questions answered.  Verb understanding, agreeable to d/c home with outpt f/u.        New Prescriptions   MECLIZINE (ANTIVERT) 25 MG TABLET    Take 1 tablet (25 mg total) by mouth 3 (three) times daily as needed for dizziness.   ONDANSETRON (ZOFRAN) 4 MG TABLET    Take 1 tablet (4 mg total) by mouth every 6 (six) hours.       [facility] M

## 2011-04-03 NOTE — ED Notes (Signed)
Pt ambulated with assist to BR and back. Pt stable and steady on feet. Family instructed to stay the night with pt to prevent fall. Family member verbalized understanding.

## 2011-04-03 NOTE — ED Notes (Signed)
Pt attempting to sip on potassium. Daughter at bedside to aid in pt drinking. Pt instructed to sip slowly so she will not have increased nausea.

## 2011-04-03 NOTE — ED Notes (Signed)
N/v/d since this morning. 

## 2011-04-03 NOTE — ED Notes (Addendum)
Verbal order from Dr Clarene Duke obtained to administer 1/2 of 5 mg tab of Valium. Valium %mg tab override from Pyxis and split in half per verbal order. Valium 2.5 mg administered per order.

## 2011-04-03 NOTE — ED Notes (Signed)
Pt a/ox4. Resp even and unlabored. D/C instructions reviewed with pt. Pt verbalized understanding. Pt to POV via w/c with daughter to transport home.

## 2011-04-04 LAB — URINE CULTURE: Colony Count: NO GROWTH

## 2011-04-27 LAB — WET PREP, GENITAL: Trich, Wet Prep: NONE SEEN

## 2011-11-14 ENCOUNTER — Other Ambulatory Visit: Payer: Self-pay | Admitting: Family Medicine

## 2011-11-14 DIAGNOSIS — Z139 Encounter for screening, unspecified: Secondary | ICD-10-CM

## 2011-11-26 ENCOUNTER — Ambulatory Visit (HOSPITAL_COMMUNITY)
Admission: RE | Admit: 2011-11-26 | Discharge: 2011-11-26 | Disposition: A | Discharge status: Home / Self Care | Payer: Medicare Other | Source: Ambulatory Visit | Attending: Family Medicine | Admitting: Family Medicine

## 2011-11-26 DIAGNOSIS — Z1231 Encounter for screening mammogram for malignant neoplasm of breast: Secondary | ICD-10-CM | POA: Insufficient documentation

## 2011-11-26 DIAGNOSIS — Z139 Encounter for screening, unspecified: Secondary | ICD-10-CM

## 2011-11-27 ENCOUNTER — Encounter: Payer: Self-pay | Admitting: Gastroenterology

## 2011-11-28 ENCOUNTER — Ambulatory Visit: Payer: BC Managed Care – PPO | Admitting: Gastroenterology

## 2011-11-28 ENCOUNTER — Ambulatory Visit (INDEPENDENT_AMBULATORY_CARE_PROVIDER_SITE_OTHER): Payer: Medicare Other | Admitting: Gastroenterology

## 2011-11-28 ENCOUNTER — Encounter: Payer: Self-pay | Admitting: Gastroenterology

## 2011-11-28 DIAGNOSIS — K6289 Other specified diseases of anus and rectum: Secondary | ICD-10-CM

## 2011-11-28 DIAGNOSIS — K649 Unspecified hemorrhoids: Secondary | ICD-10-CM

## 2011-11-28 NOTE — Assessment & Plan Note (Signed)
FAILED TO GET COMPLETE RELIEF WITH MEDICAL MANAGEMENT.  START COLACE TWICE DAILY. GO TO ONCE DAILY IF YOU ARE HAVING LOOSE STOOLS. Band hemorrhoids APR 29 AT NOON. COMPLICATIONS OF THE PROCEDURE DISCUSSED. FOLLOW UP IN 3 WEEKS.

## 2011-11-28 NOTE — Progress Notes (Signed)
  Subjective:    Patient ID: [name], female    DOB: 1929-10-29, 78 y.o.   MRN: [id number]  PCP: Charlies Silvers  HPI HEMORRHOIDS FOR YEARS. IN THE PAST YEARS-hEMORRHOIDS STARTED BOTHERING HER & SAW DR. Gerda Diss AND USED PROCTOCREAM AND IT GETS BETTER. USED PROCTOCREAM FOR 2 WEEKS. No problems with constipation. No known triggers. Has seen a little bleeding. Bms: daily-soft/hard depends on what she eats. No weight loss or change in bowel habits. Pain and pressure is the problem. NO DIFFICULTY URINATING OR BLOOD IN HER URINE. WHEN SHE DOES HAVE A BM, IF SHE "HASN'T HAD A GOOD ONE". TAKES A ENEMA: ~1X/MO. PAIN/PRESSURE ARE BETTER. SUPP DIDN'T REALLY HELP. CREAM WORKED THE Engineer, structural.  Past Medical History  Diagnosis Date  . Thyroid disease   . Hypertension   . Gout   . Dementia   . Vertigo   . Low back pain    Past Surgical History  Procedure Date  . Cholecystectomy   . Colonoscopy 11/21/09    Simple adenoma/pancolonic diverticulosis(next on due in 5/10 years)       Review of Systems     Objective:   Physical Exam  Vitals reviewed. Constitutional: She appears well-developed and well-nourished. No distress.  HENT:  Head: Normocephalic and atraumatic.  Mouth/Throat: Oropharynx is clear and moist. No oropharyngeal exudate.  Eyes: Pupils are equal, round, and reactive to light. No scleral icterus.  Neck: Normal range of motion. Neck supple.  Cardiovascular: Normal rate, regular rhythm and normal heart sounds.   Pulmonary/Chest: Effort normal and breath sounds normal. No respiratory distress.  Abdominal: Soft. Bowel sounds are normal. She exhibits no distension. There is no tenderness.  Musculoskeletal: She exhibits edema (TRACE).  Lymphadenopathy:    She has no cervical adenopathy.  Neurological: She is alert.       NO  NEW FOCAL DEFICITS           Assessment & Plan:

## 2011-11-28 NOTE — Progress Notes (Signed)
Faxed to PCP

## 2011-11-28 NOTE — Patient Instructions (Addendum)
START COLACE TWICE DAILY. GO TO ONCE DAILY IF YOU ARE HAVING LOOSE STOOLS.  We will band youR hemorrhoids APR 29 AT NOON.  FOLLOW UP IN 3 WEEKS.   HEMORRHOIDAL BANDING COMPLICATIONS:  COMMON: 1. MINOR PAIN  UNCOMMON: 1. ABSCESS 2. BAND FALLS OFF 3. PROLAPSE OF HEMORRHOIDS AND PAIN 4. ULCER BLEEDING  A. USUALLY SELF-LIMITED: MAY LAST 3-5 DAYS  B. MAY REQUIRE INTERVENTION: 1-2 WEEKS AFTER INTERACTIONS 5. NECROTIZING PELVIC SEPSIS  A. SYMPTOMS: FEVER, PAIN, DIFFICULTY URINATING

## 2011-11-29 ENCOUNTER — Encounter: Payer: Self-pay | Admitting: Gastroenterology

## 2011-11-29 NOTE — Progress Notes (Signed)
Pt is aware of OV on 12/26/11 at 0930 with SF and appt card was mailed

## 2011-11-30 ENCOUNTER — Encounter (HOSPITAL_COMMUNITY): Payer: Self-pay | Admitting: Pharmacy Technician

## 2011-12-03 ENCOUNTER — Encounter (HOSPITAL_COMMUNITY): Payer: Self-pay | Admitting: *Deleted

## 2011-12-03 ENCOUNTER — Encounter (HOSPITAL_COMMUNITY)
Admission: RE | Disposition: A | Discharge status: Home / Self Care | Payer: Self-pay | Source: Ambulatory Visit | Attending: Gastroenterology

## 2011-12-03 ENCOUNTER — Ambulatory Visit (HOSPITAL_COMMUNITY)
Admission: RE | Admit: 2011-12-03 | Discharge: 2011-12-03 | Disposition: A | Discharge status: Home / Self Care | Payer: Medicare Other | Source: Ambulatory Visit | Attending: Gastroenterology | Admitting: Gastroenterology

## 2011-12-03 DIAGNOSIS — K648 Other hemorrhoids: Secondary | ICD-10-CM | POA: Insufficient documentation

## 2011-12-03 DIAGNOSIS — K625 Hemorrhage of anus and rectum: Secondary | ICD-10-CM

## 2011-12-03 DIAGNOSIS — I10 Essential (primary) hypertension: Secondary | ICD-10-CM | POA: Insufficient documentation

## 2011-12-03 DIAGNOSIS — Z79899 Other long term (current) drug therapy: Secondary | ICD-10-CM | POA: Insufficient documentation

## 2011-12-03 HISTORY — DX: Malignant (primary) neoplasm, unspecified: C80.1

## 2011-12-03 HISTORY — DX: Hypothyroidism, unspecified: E03.9

## 2011-12-03 HISTORY — PX: FLEXIBLE SIGMOIDOSCOPY: SHX5431

## 2011-12-03 SURGERY — SIGMOIDOSCOPY, FLEXIBLE
Anesthesia: Moderate Sedation

## 2011-12-03 MED ORDER — MIDAZOLAM HCL 5 MG/5ML IJ SOLN
INTRAMUSCULAR | Status: AC
  Filled 2011-12-03: qty 10

## 2011-12-03 MED ORDER — STERILE WATER FOR IRRIGATION IR SOLN
Status: DC | PRN
  Administered 2011-12-03: 12:00:00

## 2011-12-03 MED ORDER — MEPERIDINE HCL 100 MG/ML IJ SOLN
INTRAMUSCULAR | Status: AC
  Filled 2011-12-03: qty 1

## 2011-12-03 MED ORDER — MIDAZOLAM HCL 5 MG/5ML IJ SOLN
INTRAMUSCULAR | Status: DC | PRN
  Administered 2011-12-03: 2 mg via INTRAVENOUS

## 2011-12-03 MED ORDER — SODIUM CHLORIDE 0.45 % IV SOLN: Freq: Once | INTRAVENOUS | Status: DC

## 2011-12-03 MED ORDER — MEPERIDINE HCL 100 MG/ML IJ SOLN
INTRAMUSCULAR | Status: DC | PRN
  Administered 2011-12-03: 25 mg via INTRAVENOUS

## 2011-12-03 NOTE — H&P (Signed)
  Primary Care Physician:  Lilyan Punt, MD, MD Primary Gastroenterologist:  Dr. Darrick Penna  Pre-Procedure History & Physical: HPI:  [name] is a 78 y.o. female here for  BRBPR.   Past Medical History  Diagnosis Date  . Thyroid disease   . Hypertension   . Gout   . Dementia   . Vertigo   . Low back pain   . Hypothyroidism   . Cancer 2012    Melanoma-Face Removed at [facility]    Past Surgical History  Procedure Date  . Cholecystectomy   . Colonoscopy 11/21/09    Simple adenoma/pancolonic diverticulosis(next on due in 5/10 years)  . Eye surgery     Bilateral Cataract Surgery    Prior to Admission medications   Medication Sig Start Date End Date Taking? Authorizing Provider  amitriptyline (ELAVIL) 75 MG tablet Take by mouth at bedtime.     Yes Historical Provider, MD  atorvastatin (LIPITOR) 20 MG tablet Take 20 mg by mouth daily.     Yes Historical Provider, MD  colchicine 0.6 MG tablet Take 0.6 mg by mouth 2 (two) times daily.     Yes Historical Provider, MD  levothyroxine (LEVOTHROID) 100 MCG tablet Take 100 mcg by mouth daily.     Yes Historical Provider, MD  losartan-hydrochlorothiazide (HYZAAR) 100-12.5 MG per tablet Take 1 tablet by mouth daily.     Yes Historical Provider, MD  memantine (NAMENDA) 10 MG tablet Take 5 mg by mouth 2 (two) times daily. In the morning and at bedtime    Yes Historical Provider, MD  vitamin E 400 UNIT capsule Take 400 Units by mouth daily.    Yes Historical Provider, MD  hydrocortisone (ANUSOL-HC) 2.5 % rectal cream Place 1 application rectally 2 (two) times daily.    Historical Provider, MD    Allergies as of 11/28/2011 - Review Complete 11/28/2011  Allergen Reaction Noted  . Sulfonamide derivatives      History reviewed. No pertinent family history.  History   Social History  . Marital Status: Widowed    Spouse Name: N/A    Number of Children: N/A  . Years of Education: N/A   Occupational History  . Not on file.    Social History Main Topics  . Smoking status: Never Smoker   . Smokeless tobacco: Not on file  . Alcohol Use: No  . Drug Use: No  . Sexually Active:    Other Topics Concern  . Not on file   Social History Narrative  . No narrative on file    Review of Systems: See HPI, otherwise negative ROS   Physical Exam: BP 182/93  Pulse 73  Temp(Src) 97.6 F (36.4 C) (Oral)  Resp 16  Ht 5\' 4"  (1.626 m)  Wt 184 lb (83.462 kg)  BMI 31.58 kg/m2  SpO2 98% General:   Alert,  pleasant and cooperative in NAD Head:  Normocephalic and atraumatic. Neck:  Supple;  Lungs:  Clear throughout to auscultation.    Heart:  Regular rate and rhythm. Abdomen:  Soft, nontender and nondistended. Normal bowel sounds, without guarding, and without rebound.   Neurologic:  Alert and  oriented x4;  grossly normal neurologically.  Impression/Plan:     BRBPR/RECTAL PAIN  PLAN: FLEXSIG/BANDING TODAY

## 2011-12-03 NOTE — Discharge Instructions (Signed)
HEMORRHOIDAL BANDING DISCHARGE INSTRUCTIONS  I PUT BANDS IN 3 AREAS ABOVE YOUR HEMORRHOIDS.   HOME CARE INSTRUCTIONS  FOLLOWING YOUR PROCEDURE IT IS COMMON TO HAVE MINOR RECTAL DISCOMFORT OR PAIN. 1.  May use ibuprofen 200 MG over-the-counter 2 every 6 hours as needed for mild rectal pain.   Take the ibuprofen with food and milk.  2   Use sitz bath 3 times a day AS NEEDED FOR RECTAL PAIN.  3. Avoid straining to have bowel movements. 4. Keep anal area dry and clean.  5. Do not use a donut shaped pillow or sit on the toilet for long periods. This increases blood pooling and pain.  6. Move your bowels when your body has the urge; this will require less straining and will decrease pain and pressure.  7. Add Colace 100 mg twice daily FOR 7 DAYS to soften the stool. HOLD FOR DIARRHEA.  PLEASE CALL FOR RECTAL BLEEDING. YOU MAY SEE BLEEDING FOR 3 DAYS TO 2 WEEKS.  PLEASE CALL FOR FEVER, PAIN, OR DIFFICULTY URINATING.  FOLLOW UP MAY 22.     SIGMOIDOSCOPY Care After Read the instructions outlined below and refer to this sheet in the next week. These discharge instructions provide you with general information on caring for yourself after you leave the hospital. While your treatment has been planned according to the most current medical practices available, unavoidable complications occasionally occur. If you have any problems or questions after discharge, call DR. Lakeita Panther, [phone].  ACTIVITY  You may resume your regular activity, but move at a slower pace for the next 24 hours.   Take frequent rest periods for the next 24 hours.   Walking will help get rid of the air and reduce the bloated feeling in your belly (abdomen).   No driving for 24 hours (because of the medicine (anesthesia) used during the test).   You may shower.   Do not sign any important legal documents or operate any machinery for 24 hours (because of the anesthesia used during the test).    NUTRITION  Drink  plenty of fluids.   You may resume your normal diet as instructed by your doctor.   Begin with a light meal and progress to your normal diet. Heavy or fried foods are harder to digest and may make you feel sick to your stomach (nauseated).   Avoid alcoholic beverages for 24 hours or as instructed.    MEDICATIONS  You may resume your normal medications.   WHAT YOU CAN EXPECT TODAY  Some feelings of bloating in the abdomen.   Passage of more gas than usual.   Spotting of blood in your stool or on the toilet paper  .  IF YOU HAD POLYPS REMOVED DURING THE COLONOSCOPY:  Eat a soft diet IF YOU HAVE NAUSEA, BLOATING, ABDOMINAL PAIN, OR VOMITING.    FINDING OUT THE RESULTS OF YOUR TEST Not all test results are available during your visit. DR. Darrick Penna WILL CALL YOU WITHIN 7 DAYS OF YOUR PROCEDUE WITH YOUR RESULTS. Do not assume everything is normal if you have not heard from DR. Elcie Pelster IN ONE WEEK, CALL HER OFFICE AT [phone].  SEEK IMMEDIATE MEDICAL ATTENTION AND CALL THE OFFICE: [phone] IF:  You have more than a spotting of blood in your stool.   Your belly is swollen (abdominal distention).   You are nauseated or vomiting.   You have a temperature over 101F.   You have abdominal pain or discomfort that is severe or gets worse  throughout the day.

## 2011-12-03 NOTE — Op Note (Signed)
[facility] [address] [place], [place]  [address]  FLEXIBLE SIGMOIDOSCOPY PROCEDURE REPORT  PATIENT:  Cindy, Moyer  MR#:  [id number] BIRTHDATE:  01/19/1930, 81 yrs. old  GENDER:  female  ENDOSCOPIST:  Jonette Eva, MD Referred by:  Lilyan Punt, M.D.  PROCEDURE DATE:  12/03/2011 PROCEDURE:  Flexible Sigmoidoscopy with banding ASA CLASS: INDICATIONS:  RECTAL BLEEDING/PAIN/PRESSURE  MEDICATIONS:   Demerol 25 mg IV, Versed 2 mg IV  DESCRIPTION OF PROCEDURE:   After the risks benefits and alternatives of the procedure were thoroughly explained, informed consent was obtained.  Digital rectal exam was performed and revealed no abnormalities.   The EC-[id number] ([id number]) and [id number] ([id number]) endoscope was introduced through the anus and advanced to the sigmoid colon, without limitations.  The quality of the prep was adequate.  The instrument was then slowly withdrawn as the mucosa was fully examined. <<PROCEDUREIMAGES>>  Internal hemorrhoids were found.   Retroflexed views in the rectum revealed medium hemorrhoids. A TOTAL OF 4 BANDS WERE APPLIED TO THREE SITES. BLEEDING NOTED.   The scope was then withdrawn from the patient and the procedure terminated.  COMPLICATIONS:  None  ENDOSCOPIC IMPRESSION: 1) Internal hemorrhoids, GRADE II  RECOMMENDATIONS: OPV IN 2-3 WEEKS COLACE BID CALL FOR RECTAL BLEEDING OR DIFFICULTY URINATING  REPEAT EXAM:  No  ______________________________ Jonette Eva, MD  CC:  n. eSIGNED:   August Longest at 12/03/2011 01:04 PM  Severiano Gilbert, [phone]

## 2011-12-07 ENCOUNTER — Encounter (HOSPITAL_COMMUNITY): Payer: Self-pay | Admitting: Gastroenterology

## 2011-12-26 ENCOUNTER — Encounter: Payer: Self-pay | Admitting: Gastroenterology

## 2011-12-26 ENCOUNTER — Ambulatory Visit (INDEPENDENT_AMBULATORY_CARE_PROVIDER_SITE_OTHER): Payer: Medicare Other | Admitting: Gastroenterology

## 2011-12-26 VITALS — BP 123/78 | HR 75 | Temp 98.4°F | Ht 64.0 in | Wt 184.0 lb

## 2011-12-26 DIAGNOSIS — K648 Other hemorrhoids: Secondary | ICD-10-CM

## 2011-12-26 MED ORDER — HYDROCORTISONE 2.5 % RE CREA: TOPICAL_CREAM | RECTAL | Status: DC

## 2011-12-26 NOTE — Progress Notes (Signed)
  Subjective:    Patient ID: [name], female    DOB: 06-22-1930, 78 y.o.   MRN: [id number]  PCP: Charlies Silvers  HPI Doing well after banding and then had a flare that sun: burning pain, itching. USING PROCTOSOL PRN FOR BURNING AND ITCHING.  No triggers. Bms: 1-2x/day. Had diarrhea one day. NOT TAKING FIBER SUPPLEMENTS OR PROBIOTICS. EATS BRAN CEREAL EVERY DAY.  NO BELLY PAIN OR RECTAL DISCOMFORT AFTER BANDING WAS DONE.   Past Medical History  Diagnosis Date  . Thyroid disease   . Hypertension   . Gout   . Dementia   . Vertigo   . Low back pain   . Hypothyroidism   . Cancer 2012    Melanoma[facility]Face Removed at [facility]   Past Surgical History  Procedure Date  . Cholecystectomy   . Colonoscopy 11/21/09    Simple adenoma/pancolonic diverticulosis(next on due in 5/10 years)  . Eye surgery     Bilateral Cataract Surgery  . Flexible sigmoidoscopy 12/03/2011    Procedure: FLEXIBLE SIGMOIDOSCOPY;  Surgeon: West Bali, MD;  [facility]: [facility];  Service: Endoscopy;  Laterality: N/A;  12:00   Allergies  Allergen Reactions  . Sulfonamide Derivatives     REACTION: swelling, difficulty swallowing    Current Outpatient Prescriptions  Medication Sig Dispense Refill  . amitriptyline (ELAVIL) 75 MG tablet Take by mouth at bedtime.        Marland Kitchen atorvastatin (LIPITOR) 20 MG tablet Take 20 mg by mouth daily.        . colchicine 0.6 MG tablet Take 0.6 mg by mouth 2 (two) times daily.        . hydrocortisone (ANUSOL-HC) 2.5 % rectal cream USE TOP PRN    . levothyroxine (LEVOTHROID) 100 MCG tablet Take 100 mcg by mouth daily.        Marland Kitchen losartan-hydrochlorothiazide (HYZAAR) 100-12.5 MG per tablet Take 1 tablet by mouth daily.        . memantine (NAMENDA) 10 MG tablet Take 5 mg by mouth 2 (two) times daily. In the morning and at bedtime       . vitamin E 400 UNIT capsule Take 400 Units by mouth daily.           Review of Systems     Objective:   Physical Exam  Vitals  reviewed. Constitutional: No distress.  HENT:  Head: Normocephalic and atraumatic.  Mouth/Throat: No oropharyngeal exudate.  Eyes: Pupils are equal, round, and reactive to light. No scleral icterus.  Neck: Normal range of motion. Neck supple.  Cardiovascular: Normal rate, regular rhythm and normal heart sounds.   Pulmonary/Chest: Effort normal and breath sounds normal. No respiratory distress.  Abdominal: Soft. Bowel sounds are normal. She exhibits no distension. There is no tenderness.  Lymphadenopathy:    She has no cervical adenopathy.  Neurological: She is alert.       NO  NEW FOCAL DEFICITS    Psychiatric:       SLIGHTLY ANXIOUS MOOD, NL AFFECT           Assessment & Plan:

## 2011-12-26 NOTE — Patient Instructions (Signed)
USE PROCTOSOL 2.5% FOUR TIMES A DAY FOR 10 DAYS.  USE COLACE 100 MG CAPSULE ONCE DAILY. USE EVERY OTHER DAY IF IT CAUSES DIARRHEA.  AVOID STRAINING WITH BOWEL MOVEMENTS.  FOLLOW UP IN 6 WEEKS.  Hemorrhoids Hemorrhoids are dilated (enlarged) veins around the rectum. Sometimes clots will form in the veins. This makes them swollen and painful. These are called thrombosed hemorrhoids.  Causes of hemorrhoids include:  Constipation.   Straining to have a bowel movement.   HEAVY LIFTING  HOME CARE INSTRUCTIONS  Eat a well balanced diet and drink 6 to 8 glasses of water every day to avoid constipation. You may also use a bulk laxative.   Avoid straining to have bowel movements.   Keep anal area dry and clean.   Do not use a donut shaped pillow or sit on the toilet for long periods. This increases blood pooling and pain.   Move your bowels when your body has the urge; this will require less straining and will decrease pain and pressure.

## 2012-01-02 DIAGNOSIS — K648 Other hemorrhoids: Secondary | ICD-10-CM | POA: Insufficient documentation

## 2012-01-02 NOTE — Assessment & Plan Note (Signed)
EXPERIENCE TEMPORARY RELIEF FOR BLEEDING & LESS PAIN AFTER BANDING. NOW HAS RECTAL DISCOMFORT W/O BLEEDING. EXPLAINED TO PT WITHOUT SIGNIFICANT DISCOMFORT OR BLEEDING MAY NOT NEED REPEAT BANDING.   DISCUSSED TREATMENT OPTIONS WITH FAMILY AND PT: 1. REPEAT FLEX SIG/?BANDING, 2. SURGERY OR 3. PROCTOSOL/COLACE AND OPV. PT ELECTED OPTION #3.  PROCTOSOL 2.5% FOUR TIMES A DAY FOR 10 DAYS.  COLACE 100 MG CAPSULE ONCE DAILY. USE EVERY OTHER DAY IF IT CAUSES DIARRHEA.  AVOID STRAINING WITH BOWEL MOVEMENTS.  FOLLOW UP IN 6 WEEKS.

## 2012-01-03 ENCOUNTER — Telehealth: Payer: Self-pay

## 2012-01-03 NOTE — Telephone Encounter (Signed)
Talked to SLF and she said for the Pt to do sitz bath TID, witch hazel pads. We are going to call in some Vicodin 5-500 one every 6 hours as need for pain. We are also going to consults Dr. Lovell Sheehan for a surgical consults for the hemorrhoids. If the pain get to bad she should go to the ER to be checked out.

## 2012-01-03 NOTE — Progress Notes (Signed)
Faxed to PCP

## 2012-01-03 NOTE — Telephone Encounter (Signed)
Pt's daughter wants to know if there anything she can get for the pain, cream or pills. She can not take the pain. She is using the Anusol cream now but it is not helping with the pain.

## 2012-01-03 NOTE — Progress Notes (Signed)
Pt is aware of OV on 6/26 at 9 with SF in E30

## 2012-01-04 NOTE — Telephone Encounter (Signed)
REVIEWED. AGREE. 

## 2012-01-06 NOTE — Telephone Encounter (Signed)
DOING A LITTLE BIT BETTER. SOOTHED BOTTOM WITH WARM WATER. PAIN MEDICINES WORKING. SURGERY REFERRAL PENDING. EXPLAINED PT HAVING SIGNIFICANT DISCOMFORT AND LIKELY WILL NEED DEFINITIVE THERAPY-SURGERY. PT VOICED HER UNDERSTANDING.

## 2012-01-07 ENCOUNTER — Telehealth: Payer: Self-pay | Admitting: Gastroenterology

## 2012-01-07 NOTE — Telephone Encounter (Signed)
Patients daughter called and stated that the family would like [name] to see Dr. Avel Peace at [facility] for hemorrhoid surgery and she has an appointment there 06/27 at 5:00 with Dr. Abbey Chatters.

## 2012-01-07 NOTE — Telephone Encounter (Signed)
Referral Sent to Dr. Lovell Sheehan

## 2012-01-08 NOTE — Telephone Encounter (Signed)
REVIEWED.  

## 2012-01-30 ENCOUNTER — Ambulatory Visit: Payer: Medicare Other | Admitting: Gastroenterology

## 2012-01-31 ENCOUNTER — Encounter (INDEPENDENT_AMBULATORY_CARE_PROVIDER_SITE_OTHER): Payer: Self-pay | Admitting: General Surgery

## 2012-01-31 ENCOUNTER — Ambulatory Visit (INDEPENDENT_AMBULATORY_CARE_PROVIDER_SITE_OTHER): Payer: Medicare Other | Admitting: General Surgery

## 2012-01-31 VITALS — BP 138/76 | HR 68 | Temp 97.8°F | Ht 65.0 in | Wt 184.8 lb

## 2012-01-31 DIAGNOSIS — L03317 Cellulitis of buttock: Secondary | ICD-10-CM

## 2012-01-31 DIAGNOSIS — L0231 Cutaneous abscess of buttock: Secondary | ICD-10-CM

## 2012-01-31 MED ORDER — DOXYCYCLINE HYCLATE 100 MG PO TABS: 100.0000 mg | ORAL_TABLET | Freq: Two times a day (BID) | ORAL | Status: AC

## 2012-01-31 NOTE — Progress Notes (Signed)
Patient ID: [name], female   DOB: 11/08/1929, 78 y.o.   MRN: [id number]  Chief Complaint  Patient presents with  . Follow-up    reck hems    HPI [name] is a 78 y.o. female.   HPI  She is referred by Dr. Darrick Penna for evaluation of hemorrhoidal disease.  She's had problems with some bleeding and pain for about a year. Dr. Darrick Penna has done rubber band ligation. She is also on a stool softer and some 2.5% Anusol. With that, she is much better. She denies any bleeding recently. Yesterday, she had a uncomfortable area show up on her left buttock she states.  She denies constipation.  Past Medical History  Diagnosis Date  . Thyroid disease   . Hypertension   . Gout   . Dementia   . Vertigo   . Low back pain   . Hypothyroidism   . Cancer 2012    Melanoma-Face Removed at [facility]  . Arthritis   . Hyperlipidemia     Past Surgical History  Procedure Date  . Cholecystectomy   . Colonoscopy 11/21/09    Simple adenoma/pancolonic diverticulosis(next on due in 5/10 years)  . Eye surgery     Bilateral Cataract Surgery  . Flexible sigmoidoscopy 12/03/2011    Procedure: FLEXIBLE SIGMOIDOSCOPY;  Surgeon: West Bali, MD;  Location: [facility];  Service: Endoscopy;  Laterality: N/A;  12:00  . Bladder surgery     remove cancer  . Melanoma excision     on face    History reviewed. No pertinent family history.  Social History History  Substance Use Topics  . Smoking status: Never Smoker   . Smokeless tobacco: Not on file  . Alcohol Use: No    Allergies  Allergen Reactions  . Sulfonamide Derivatives     REACTION: swelling, difficulty swallowing    Current Outpatient Prescriptions  Medication Sig Dispense Refill  . allopurinol (ZYLOPRIM) 100 MG tablet       . amitriptyline (ELAVIL) 75 MG tablet Take by mouth at bedtime.        Marland Kitchen atorvastatin (LIPITOR) 20 MG tablet Take 20 mg by mouth daily.        . colchicine 0.6 MG tablet Take 0.6 mg by mouth 2 (two) times  daily.        Marland Kitchen HYDROcodone-acetaminophen (VICODIN) 5-500 MG per tablet Take 0.5 tablets by mouth as needed.      . hydrocortisone (ANUSOL-HC) 2.5 % rectal cream USE TOP ON RECTUM FOUR TIMES A DAY FOR 10 DAYS  30 g  0  . levothyroxine (LEVOTHROID) 100 MCG tablet Take 100 mcg by mouth daily.        Marland Kitchen losartan-hydrochlorothiazide (HYZAAR) 100-12.5 MG per tablet Take 1 tablet by mouth daily.        . memantine (NAMENDA) 10 MG tablet Take 5 mg by mouth 2 (two) times daily. In the morning and at bedtime       . PROCTOFOAM HC rectal foam       . tiZANidine (ZANAFLEX) 2 MG tablet       . vitamin E 400 UNIT capsule Take 400 Units by mouth daily.       Marland Kitchen doxycycline (VIBRA-TABS) 100 MG tablet Take 1 tablet (100 mg total) by mouth 2 (two) times daily.  20 tablet  0    Review of Systems Review of Systems  Gastrointestinal: Positive for rectal pain. Negative for constipation, blood in stool and anal bleeding.  Skin: Positive for rash (left bottock).    Blood pressure 138/76, pulse 68, temperature 97.8 F (36.6 C), temperature source Temporal, height 5\' 5"  (1.651 m), weight 184 lb 12.8 oz (83.825 kg), SpO2 98.00%.  Physical Exam Physical Exam  Constitutional:       Elderly female in NAD.  Genitourinary:       Multiple very small pustules with an area of erythema in the left buttock region.  A small external skin tag in the right anterior area. No fissures. On digital rectal exam no masses or blood.   Anoscopy was performed. There are very small internal hemorrhoids present.  I went ahead and manipulated the pustules and put some pressure on them and drained cloudy fluid from all of them. A dry dressing was applied.    Data Reviewed Dr. Darrick Penna notes.  Assessment    Internal and external hemorrhoids that are asymptomatic currently. The Anusol 2.5% cream and stool softener seemed to be helping significantly I don't see anything with respect to her hemorrhoids that need to procedure or  operation at this time.  She does have either a abscess or rash on the left buttock.    Plan    Continue current treatment for hemorrhoids as needed. Wash the area of the left buttock in the shower daily. I will place her on doxycycline for 10 days. Return visit 3 weeks.       Naliah Eddington J 01/31/2012, 5:51 PM

## 2012-01-31 NOTE — Patient Instructions (Addendum)
Clean left buttock area in shower daily and apply a dry dressing.  I do not feel that you need any procedures for your hemorrhoids.  Take antibiotic as directed.

## 2012-02-21 ENCOUNTER — Telehealth (INDEPENDENT_AMBULATORY_CARE_PROVIDER_SITE_OTHER): Payer: Self-pay

## 2012-02-21 NOTE — Telephone Encounter (Signed)
Unsuccessful attempt to reach pt's daughter with appt for 03/24/12 w/ Dr. Abbey Chatters.

## 2012-02-25 ENCOUNTER — Encounter (INDEPENDENT_AMBULATORY_CARE_PROVIDER_SITE_OTHER): Payer: Medicare Other | Admitting: General Surgery

## 2012-03-18 ENCOUNTER — Encounter (INDEPENDENT_AMBULATORY_CARE_PROVIDER_SITE_OTHER): Payer: Self-pay | Admitting: General Surgery

## 2012-03-18 ENCOUNTER — Ambulatory Visit (INDEPENDENT_AMBULATORY_CARE_PROVIDER_SITE_OTHER): Payer: Medicare Other | Admitting: General Surgery

## 2012-03-18 VITALS — BP 144/82 | HR 75 | Temp 97.2°F | Ht 65.0 in | Wt 186.8 lb

## 2012-03-18 DIAGNOSIS — K644 Residual hemorrhoidal skin tags: Secondary | ICD-10-CM

## 2012-03-18 NOTE — Progress Notes (Signed)
Patient ID: [name], female   DOB: 26-Nov-1929, 78 y.o.   MRN: [id number]  Chief Complaint  Patient presents with  . Follow-up    reck hems    HPI [name] is a 78 y.o. female.   HPI  She is here for followup of her hemorrhoidal disease. The perianal infection has resolved. She gets intermittent swelling and pain from one of her external hemorrhoids. No significant bleeding. She does have intermittent constipation.  Past Medical History  Diagnosis Date  . Thyroid disease   . Hypertension   . Gout   . Dementia   . Vertigo   . Low back pain   . Hypothyroidism   . Arthritis   . Hyperlipidemia   . Hemorrhoids with complication   . Cancer 2012    Melanoma-Face Removed at [facility]    Past Surgical History  Procedure Date  . Cholecystectomy   . Colonoscopy 11/21/09    Simple adenoma/pancolonic diverticulosis(next on due in 5/10 years)  . Eye surgery     Bilateral Cataract Surgery  . Flexible sigmoidoscopy 12/03/2011    Procedure: FLEXIBLE SIGMOIDOSCOPY;  Surgeon: West Bali, MD;  Location: [facility];  Service: Endoscopy;  Laterality: N/A;  12:00  . Bladder surgery     remove cancer  . Melanoma excision     on face    History reviewed. No pertinent family history.  Social History History  Substance Use Topics  . Smoking status: Never Smoker   . Smokeless tobacco: Not on file  . Alcohol Use: No    Allergies  Allergen Reactions  . Sulfonamide Derivatives     REACTION: swelling, difficulty swallowing    Current Outpatient Prescriptions  Medication Sig Dispense Refill  . allopurinol (ZYLOPRIM) 100 MG tablet       . amitriptyline (ELAVIL) 75 MG tablet Take by mouth at bedtime.        Marland Kitchen atorvastatin (LIPITOR) 20 MG tablet Take 20 mg by mouth daily.        . colchicine 0.6 MG tablet Take 0.6 mg by mouth 2 (two) times daily.        Marland Kitchen HYDROcodone-acetaminophen (VICODIN) 5-500 MG per tablet Take 0.5 tablets by mouth as needed.      .  hydrocortisone (ANUSOL-HC) 2.5 % rectal cream USE TOP ON RECTUM FOUR TIMES A DAY FOR 10 DAYS  30 g  0  . levothyroxine (LEVOTHROID) 100 MCG tablet Take 100 mcg by mouth daily.        Marland Kitchen losartan-hydrochlorothiazide (HYZAAR) 100-12.5 MG per tablet Take 1 tablet by mouth daily.        . memantine (NAMENDA) 10 MG tablet Take 5 mg by mouth 2 (two) times daily. In the morning and at bedtime       . PROCTOFOAM HC rectal foam       . tiZANidine (ZANAFLEX) 2 MG tablet       . vitamin E 400 UNIT capsule Take 400 Units by mouth daily.         Review of Systems Review of Systems  Gastrointestinal: Positive for constipation and rectal pain. Negative for abdominal pain.    Blood pressure 144/82, pulse 75, temperature 97.2 F (36.2 C), temperature source Temporal, height 5\' 5"  (1.651 m), weight 186 lb 12.8 oz (84.732 kg), SpO2 95.00%.  Physical Exam Physical Exam  Constitutional:       Elderly overweight female in no acute distress.  Genitourinary:  Left buttock infection has resolved. There is an external right anterior hemorrhoid that is nontender. No anal fissure. On digital rectal exam, no masses are palpable.  Anoscopy-no significant internal hemorrhoids seen today. No masses. No blood.    Data Reviewed Previous note  Assessment    Intermittently symptomatic external hemorrhoid right anterior area.  At this time, I don't think she needs removal of this hemorrhoid.    Plan    Apply witch hazel solution to the area as needed for symptomatic relief. Avoid constipation and prolonged sitting on the commode. Return visit as needed.       Korrine Sicard J 03/18/2012, 4:42 PM

## 2012-03-18 NOTE — Patient Instructions (Signed)
Avoid constipation.  Apply witch hazel to the area when it becomes swollen and uncomfortable.

## 2012-03-24 ENCOUNTER — Ambulatory Visit (INDEPENDENT_AMBULATORY_CARE_PROVIDER_SITE_OTHER): Payer: Medicare Other | Admitting: General Surgery

## 2012-06-04 ENCOUNTER — Encounter (INDEPENDENT_AMBULATORY_CARE_PROVIDER_SITE_OTHER): Payer: Medicare Other | Admitting: General Surgery

## 2012-06-09 ENCOUNTER — Ambulatory Visit (INDEPENDENT_AMBULATORY_CARE_PROVIDER_SITE_OTHER): Payer: Medicare Other | Admitting: General Surgery

## 2012-06-09 ENCOUNTER — Encounter (INDEPENDENT_AMBULATORY_CARE_PROVIDER_SITE_OTHER): Payer: Self-pay | Admitting: General Surgery

## 2012-06-09 VITALS — BP 122/80 | HR 72 | Temp 96.9°F | Resp 20 | Ht 64.0 in | Wt 192.0 lb

## 2012-06-09 DIAGNOSIS — K6289 Other specified diseases of anus and rectum: Secondary | ICD-10-CM

## 2012-06-09 NOTE — Patient Instructions (Signed)
Use a sitz bath to cleaning himself after bowel movements. Change her tissue paper. Stop cleaning the area so vigorously. Using Desitin on the area.

## 2012-06-09 NOTE — Progress Notes (Signed)
She is here for evaluation of anal pain. She states it burns and stings in the anal area.  No swelling. She claims the area vigorously with tissue paper. She's been applying Proctofoam to the area.  Examination  Anorectal:  There is a soft mobile external hemorrhoid at the right anterior position it is non-tender; there were multiple small areas of skin breakdown in the anoderm that are tender.  Assessment: Excoriation of the anoderm from over-vigorous cleansing. I do not think the pain is coming from her external hemorrhoid as this was non-tender.  Plan: Use a sitz bath after bowel movements. Avoid vigorous cleansing of the anal area. Apply Desitin to the area. Return visit when necessary.

## 2012-08-14 ENCOUNTER — Other Ambulatory Visit: Payer: Self-pay | Admitting: Family Medicine

## 2012-08-14 ENCOUNTER — Ambulatory Visit (HOSPITAL_COMMUNITY)
Admission: RE | Admit: 2012-08-14 | Discharge: 2012-08-14 | Disposition: A | Discharge status: Home / Self Care | Payer: Medicare Other | Source: Ambulatory Visit | Attending: Family Medicine | Admitting: Family Medicine

## 2012-08-14 DIAGNOSIS — M4716 Other spondylosis with myelopathy, lumbar region: Secondary | ICD-10-CM | POA: Insufficient documentation

## 2012-08-14 DIAGNOSIS — M545 Low back pain, unspecified: Secondary | ICD-10-CM | POA: Insufficient documentation

## 2012-10-28 ENCOUNTER — Other Ambulatory Visit: Payer: Self-pay | Admitting: *Deleted

## 2012-10-28 MED ORDER — AMITRIPTYLINE HCL 75 MG PO TABS: 75.0000 mg | ORAL_TABLET | Freq: Every day | ORAL | Status: DC

## 2012-10-31 ENCOUNTER — Ambulatory Visit (INDEPENDENT_AMBULATORY_CARE_PROVIDER_SITE_OTHER): Payer: Medicare Other | Admitting: Nurse Practitioner

## 2012-10-31 ENCOUNTER — Encounter: Payer: Self-pay | Admitting: Nurse Practitioner

## 2012-10-31 VITALS — BP 142/94 | Wt 198.0 lb

## 2012-10-31 DIAGNOSIS — R300 Dysuria: Secondary | ICD-10-CM

## 2012-10-31 LAB — POCT URINALYSIS DIPSTICK

## 2012-10-31 LAB — POCT UA - MICROSCOPIC ONLY: RBC, urine, microscopic: 0

## 2012-10-31 MED ORDER — CEFPROZIL 500 MG PO TABS: 500.0000 mg | ORAL_TABLET | Freq: Two times a day (BID) | ORAL | Status: DC

## 2012-10-31 NOTE — Patient Instructions (Signed)
Call back Monday if no better.  Go to ER over weekend if symptoms worsen including fever, abdominal pain, back pain, vomiting or confusion.

## 2012-11-01 ENCOUNTER — Encounter: Payer: Self-pay | Admitting: Nurse Practitioner

## 2012-11-01 DIAGNOSIS — R300 Dysuria: Secondary | ICD-10-CM | POA: Insufficient documentation

## 2012-11-01 NOTE — Progress Notes (Signed)
Subjective:  Presents with family today for complaints of urinary symptoms of the past week, much better today. No abdominal pain. No back or flank pain. No fever. Some urgency and frequency. No history of recent UTI. No vaginal discharge. Unclear if there is any external irritation. Patient providing most of history with the help of family, has been diagnosed with Alzheimer's disease. Taking fluids well.  Objective:   BP 142/94  Wt 198 lb (89.812 kg)  BMI 33.97 kg/m2 NAD. Alert, oriented. Lungs clear. Heart regular rate rhythm. No CVA or flank tenderness. Abdomen soft nondistended nontender. External GU no irritation noted, signs of hypoestrogenism. Urine microscopic rare WBC, otherwise clear. Urine rarely dilute.

## 2012-11-01 NOTE — Assessment & Plan Note (Signed)
Urine sent for C&S. Cefzil as x7 days. Warning signs discussed with family. Call back in 72 hours if no improvement, call or go to ED over the weekend if worse.

## 2012-11-03 ENCOUNTER — Other Ambulatory Visit: Payer: Self-pay | Admitting: *Deleted

## 2012-11-03 MED ORDER — ALLOPURINOL 100 MG PO TABS: 100.0000 mg | ORAL_TABLET | Freq: Every day | ORAL | Status: DC

## 2012-11-04 LAB — URINE CULTURE

## 2012-11-05 ENCOUNTER — Ambulatory Visit (INDEPENDENT_AMBULATORY_CARE_PROVIDER_SITE_OTHER): Payer: Medicare Other | Admitting: Family Medicine

## 2012-11-05 ENCOUNTER — Encounter: Payer: Self-pay | Admitting: Family Medicine

## 2012-11-05 VITALS — BP 138/90 | Temp 98.1°F | Wt 199.6 lb

## 2012-11-05 DIAGNOSIS — E119 Type 2 diabetes mellitus without complications: Secondary | ICD-10-CM

## 2012-11-05 DIAGNOSIS — R739 Hyperglycemia, unspecified: Secondary | ICD-10-CM

## 2012-11-05 DIAGNOSIS — F329 Major depressive disorder, single episode, unspecified: Secondary | ICD-10-CM

## 2012-11-05 DIAGNOSIS — I10 Essential (primary) hypertension: Secondary | ICD-10-CM

## 2012-11-05 DIAGNOSIS — M549 Dorsalgia, unspecified: Secondary | ICD-10-CM

## 2012-11-05 DIAGNOSIS — N318 Other neuromuscular dysfunction of bladder: Secondary | ICD-10-CM

## 2012-11-05 DIAGNOSIS — F068 Other specified mental disorders due to known physiological condition: Secondary | ICD-10-CM

## 2012-11-05 DIAGNOSIS — F3289 Other specified depressive episodes: Secondary | ICD-10-CM

## 2012-11-05 DIAGNOSIS — R7309 Other abnormal glucose: Secondary | ICD-10-CM

## 2012-11-05 MED ORDER — CITALOPRAM HYDROBROMIDE 10 MG PO TABS: 10.0000 mg | ORAL_TABLET | Freq: Every day | ORAL | Status: DC

## 2012-11-05 MED ORDER — GABAPENTIN 100 MG PO CAPS: 100.0000 mg | ORAL_CAPSULE | Freq: Two times a day (BID) | ORAL | Status: DC

## 2012-11-05 NOTE — Progress Notes (Signed)
  Subjective:    Patient ID: [name], female    DOB: 1930/05/31, 79 y.o.   MRN: [id number]  HPI Patient arrives office for short-term concerns. However, her daughter arrives with her with many other concerns. Patient has had more trouble with depression lately. Having sad spells. Amitriptyline was stopped. Family is frustrated. States the nighttime gabapentin has helped his neuropathic back pain. However depression is return. Has a history of early dementia and Alzheimer's disease. Also was seen recently for a UTI. Place on Cefzil. Has been on it 5 days now experiencing diarrhea. Dysuria is gone. Also notes bump on left labia which concerns the patient. Intermittent headaches recently of which the family has used Zanaflex when necessary. Family reports they miss the followup due to the snow and so therefore would like to discuss all of these concerns at great length.   Review of Systems Diminished energy. No weight loss or weight gain. Intermittent headaches diffuse in nature. Loose stools recent days after starting antibiotics.    Objective:   Physical Exam   Alert hydration good. HEENT normal. Vital signs reviewed. Lungs clear. Heart regular in rhythm. Soft abdomen. No particular tenderness. Good bowel sounds. Left labial region small cyst noted    Assessment & Plan:   Results for orders placed in visit on 11/05/12  GLUCOSE, POCT (MANUAL RESULT ENTRY)      Result Value Range   POC Glucose 125 (*) 70 - 99 mg/dl   Impression #1 dementia fairly stable per family. #2 depression somewhat worse discussed. #3 back pain/neuropathic pain improved. #4 cystitis UA today now shows no white blood cells. #5 cyst on scan completely benign discussed. #6 headaches. #7 glucose concerns sugar normal. Plan start Celexa 10 every morning. See Dr. Lorin Picket in 3 months. Maintain nighttime gabapentin. Stop antibiotics. Add probiotics. Easily 35-40 minutes spent with his family in order to try to help. WSL

## 2012-11-10 ENCOUNTER — Telehealth: Payer: Self-pay | Admitting: Family Medicine

## 2012-11-10 NOTE — Telephone Encounter (Signed)
Daughter states mothers anti-depressant does not seem to be working. [name] is very nervous.  [name] would like for nurse or Doctor Lorin Picket to call as soon as possible.  [name] states she doesn't mind if Dr. Lorin Picket calls her late in the day to talk about her mother.

## 2012-11-12 ENCOUNTER — Other Ambulatory Visit: Payer: Self-pay | Admitting: Family Medicine

## 2012-11-12 DIAGNOSIS — N907 Vulvar cyst: Secondary | ICD-10-CM

## 2012-11-12 NOTE — Telephone Encounter (Signed)
[name] called this AM regarding her mother again.  Would like for someone to call her as soon as possible.

## 2012-11-13 ENCOUNTER — Telehealth: Payer: Self-pay | Admitting: Family Medicine

## 2012-11-13 NOTE — Telephone Encounter (Signed)
Yep. Could be allergy related. Try Triamcinolone 0.1 % cream apply bid prn 30 g 3 refills . Office visit if not better by next week

## 2012-11-13 NOTE — Telephone Encounter (Signed)
Discussed with daughter and med called into [facility].

## 2012-11-13 NOTE — Telephone Encounter (Signed)
[name] called stating patient has a very itchy rash on her neck and body.  Unsure if medication related or due to pollen.  Suggestions?  Also questioned referral to Dr. Emelda Fear, explained that I am aware and will work on that soon.

## 2012-11-14 ENCOUNTER — Encounter: Payer: Self-pay | Admitting: Adult Health

## 2012-11-14 ENCOUNTER — Ambulatory Visit (INDEPENDENT_AMBULATORY_CARE_PROVIDER_SITE_OTHER): Payer: Medicare Other | Admitting: Adult Health

## 2012-11-14 VITALS — BP 124/76 | Ht 64.0 in | Wt 200.0 lb

## 2012-11-14 DIAGNOSIS — N898 Other specified noninflammatory disorders of vagina: Secondary | ICD-10-CM

## 2012-11-14 DIAGNOSIS — B379 Candidiasis, unspecified: Secondary | ICD-10-CM | POA: Insufficient documentation

## 2012-11-14 DIAGNOSIS — B373 Candidiasis of vulva and vagina: Secondary | ICD-10-CM

## 2012-11-14 DIAGNOSIS — N952 Postmenopausal atrophic vaginitis: Secondary | ICD-10-CM

## 2012-11-14 DIAGNOSIS — L293 Anogenital pruritus, unspecified: Secondary | ICD-10-CM

## 2012-11-14 LAB — POCT WET PREP (WET MOUNT)

## 2012-11-14 MED ORDER — FLUCONAZOLE 150 MG PO TABS: ORAL_TABLET | ORAL | Status: DC

## 2012-11-14 MED ORDER — NYSTATIN-TRIAMCINOLONE 100000-0.1 UNIT/GM-% EX CREA: TOPICAL_CREAM | Freq: Two times a day (BID) | CUTANEOUS | Status: DC

## 2012-11-14 NOTE — Patient Instructions (Addendum)
Take 1 diflucan now and repeat 1 in 3 days if needed Use Mytrex cream 2 x daily as needed to external tissue  Pat area dry after bath  has yeast infection and yeast in groin left side, vaginal tissue dry and thin . Has cyst left labia Follow up prn

## 2012-11-14 NOTE — Progress Notes (Signed)
Subjective:     Patient ID: Lajuan Lines, female   DOB: 1930/07/02, 79 y.o.   MRN: [id number]  Marlis Edelson is a 79 year old white female in today complaining of vaginal itching, has used OTC cream   Review of SystemsComplains of vaginal itch and a bump down there.Recently took antibiotic for UTI. Past medical, surgical, social and family history. Reviewed medications an allergies.     Objective:   Physical Exam Blood pressure 124/76, height 5\' 4"  (1.626 m), weight 200 lb (90.719 kg).    Skin warm and dry. Pelvic: Has a red irregular area left groin that itches,exteranl genitalia normal for age except small dermal cyst, vagina is atrophic and dry and irritated with lite discharge. Uterus felt to be NSSC non tender no adnexal masses or tenderness noted. Wet prep was + yeast.  Assessment:    Yeast   Skin fungus   Atrophic vaginal tissue Plan:      Rx diflucan 150 mg 1 now and 1 in 3 days if needed, she requests pill over cream Use mytrex 2 daily as needed Keep dry in that area  Follow up prn

## 2012-11-18 ENCOUNTER — Emergency Department (HOSPITAL_COMMUNITY): Payer: Medicare Other

## 2012-11-18 ENCOUNTER — Inpatient Hospital Stay (HOSPITAL_COMMUNITY)
Admission: EM | Admit: 2012-11-18 | Discharge: 2012-11-19 | DRG: 309 | Disposition: A | Discharge status: Home / Self Care | Payer: Medicare Other | Attending: Internal Medicine | Admitting: Internal Medicine

## 2012-11-18 ENCOUNTER — Encounter (HOSPITAL_COMMUNITY): Payer: Self-pay

## 2012-11-18 DIAGNOSIS — Z85820 Personal history of malignant melanoma of skin: Secondary | ICD-10-CM

## 2012-11-18 DIAGNOSIS — F068 Other specified mental disorders due to known physiological condition: Secondary | ICD-10-CM

## 2012-11-18 DIAGNOSIS — N760 Acute vaginitis: Secondary | ICD-10-CM

## 2012-11-18 DIAGNOSIS — Z8551 Personal history of malignant neoplasm of bladder: Secondary | ICD-10-CM

## 2012-11-18 DIAGNOSIS — I4891 Unspecified atrial fibrillation: Secondary | ICD-10-CM

## 2012-11-18 DIAGNOSIS — F411 Generalized anxiety disorder: Secondary | ICD-10-CM

## 2012-11-18 DIAGNOSIS — M545 Low back pain, unspecified: Secondary | ICD-10-CM | POA: Diagnosis present

## 2012-11-18 DIAGNOSIS — Z79899 Other long term (current) drug therapy: Secondary | ICD-10-CM

## 2012-11-18 DIAGNOSIS — E119 Type 2 diabetes mellitus without complications: Secondary | ICD-10-CM

## 2012-11-18 DIAGNOSIS — K648 Other hemorrhoids: Secondary | ICD-10-CM

## 2012-11-18 DIAGNOSIS — N318 Other neuromuscular dysfunction of bladder: Secondary | ICD-10-CM

## 2012-11-18 DIAGNOSIS — R011 Cardiac murmur, unspecified: Secondary | ICD-10-CM

## 2012-11-18 DIAGNOSIS — M549 Dorsalgia, unspecified: Secondary | ICD-10-CM

## 2012-11-18 DIAGNOSIS — F0150 Vascular dementia without behavioral disturbance: Secondary | ICD-10-CM | POA: Diagnosis present

## 2012-11-18 DIAGNOSIS — Z833 Family history of diabetes mellitus: Secondary | ICD-10-CM

## 2012-11-18 DIAGNOSIS — R300 Dysuria: Secondary | ICD-10-CM

## 2012-11-18 DIAGNOSIS — E669 Obesity, unspecified: Secondary | ICD-10-CM | POA: Diagnosis present

## 2012-11-18 DIAGNOSIS — E039 Hypothyroidism, unspecified: Secondary | ICD-10-CM

## 2012-11-18 DIAGNOSIS — F3289 Other specified depressive episodes: Secondary | ICD-10-CM

## 2012-11-18 DIAGNOSIS — N390 Urinary tract infection, site not specified: Secondary | ICD-10-CM

## 2012-11-18 DIAGNOSIS — Z803 Family history of malignant neoplasm of breast: Secondary | ICD-10-CM

## 2012-11-18 DIAGNOSIS — B373 Candidiasis of vulva and vagina: Secondary | ICD-10-CM | POA: Diagnosis present

## 2012-11-18 DIAGNOSIS — B379 Candidiasis, unspecified: Secondary | ICD-10-CM

## 2012-11-18 DIAGNOSIS — Z8669 Personal history of other diseases of the nervous system and sense organs: Secondary | ICD-10-CM

## 2012-11-18 DIAGNOSIS — G43909 Migraine, unspecified, not intractable, without status migrainosus: Secondary | ICD-10-CM

## 2012-11-18 DIAGNOSIS — Z6834 Body mass index (BMI) 34.0-34.9, adult: Secondary | ICD-10-CM

## 2012-11-18 DIAGNOSIS — K6289 Other specified diseases of anus and rectum: Secondary | ICD-10-CM

## 2012-11-18 DIAGNOSIS — Z882 Allergy status to sulfonamides status: Secondary | ICD-10-CM

## 2012-11-18 DIAGNOSIS — B3731 Acute candidiasis of vulva and vagina: Secondary | ICD-10-CM | POA: Diagnosis present

## 2012-11-18 DIAGNOSIS — G8929 Other chronic pain: Secondary | ICD-10-CM | POA: Diagnosis present

## 2012-11-18 DIAGNOSIS — J45909 Unspecified asthma, uncomplicated: Secondary | ICD-10-CM

## 2012-11-18 DIAGNOSIS — M109 Gout, unspecified: Secondary | ICD-10-CM | POA: Diagnosis present

## 2012-11-18 DIAGNOSIS — A498 Other bacterial infections of unspecified site: Secondary | ICD-10-CM | POA: Diagnosis present

## 2012-11-18 DIAGNOSIS — I10 Essential (primary) hypertension: Secondary | ICD-10-CM

## 2012-11-18 DIAGNOSIS — F329 Major depressive disorder, single episode, unspecified: Secondary | ICD-10-CM

## 2012-11-18 DIAGNOSIS — E785 Hyperlipidemia, unspecified: Secondary | ICD-10-CM

## 2012-11-18 DIAGNOSIS — M129 Arthropathy, unspecified: Secondary | ICD-10-CM | POA: Diagnosis present

## 2012-11-18 DIAGNOSIS — I672 Cerebral atherosclerosis: Secondary | ICD-10-CM | POA: Diagnosis present

## 2012-11-18 DIAGNOSIS — Z85828 Personal history of other malignant neoplasm of skin: Secondary | ICD-10-CM

## 2012-11-18 DIAGNOSIS — I868 Varicose veins of other specified sites: Secondary | ICD-10-CM

## 2012-11-18 LAB — POCT I-STAT, CHEM 8
BUN: 13 mg/dL (ref 6–23)
Chloride: 99 mEq/L (ref 96–112)
Creatinine, Ser: 0.9 mg/dL (ref 0.50–1.10)
Potassium: 3.5 mEq/L (ref 3.5–5.1)
Sodium: 132 mEq/L — ABNORMAL LOW (ref 135–145)
TCO2: 22 mmol/L (ref 0–100)

## 2012-11-18 LAB — CBC
HCT: 38.9 % (ref 36.0–46.0)
Hemoglobin: 13.6 g/dL (ref 12.0–15.0)
MCV: 86.3 fL (ref 78.0–100.0)
RBC: 4.51 MIL/uL (ref 3.87–5.11)
WBC: 8.5 10*3/uL (ref 4.0–10.5)

## 2012-11-18 LAB — POCT I-STAT TROPONIN I

## 2012-11-18 MED ORDER — ADENOSINE 6 MG/2ML IV SOLN
INTRAVENOUS | Status: AC
  Administered 2012-11-18: 6 mg via INTRAVENOUS
  Filled 2012-11-18: qty 6

## 2012-11-18 MED ORDER — SODIUM CHLORIDE 0.9 % IV SOLN
INTRAVENOUS | Status: DC
  Administered 2012-11-18: 1000 mL via INTRAVENOUS

## 2012-11-18 MED ORDER — DILTIAZEM HCL 100 MG IV SOLR
5.0000 mg/h | INTRAVENOUS | Status: DC
  Administered 2012-11-18: 5 mg/h via INTRAVENOUS
  Filled 2012-11-18: qty 100

## 2012-11-18 MED ORDER — ADENOSINE 6 MG/2ML IV SOLN: 6.0000 mg | Freq: Once | INTRAVENOUS | Status: AC

## 2012-11-18 NOTE — ED Provider Notes (Signed)
History  This chart was scribed for Sunnie Nielsen, MD by Bennett Scrape, ED Scribe. This patient was seen in room [facility] and the patient's care was started at 11:04 PM.  CSN: [id number]  Arrival date & time 11/18/12  2301   First MD Initiated Contact with Patient 11/18/12 2304      Chief Complaint  Patient presents with  . Chest Pain     The history is provided by the patient and a relative (daughter). No language interpreter was used.    [name] is a 79 y.o. female who presents to the Emergency Department complaining of moderate to severe, non-radiating, central CP with associated SOB that started at tonight while the pt was at rest. She is unable to describe the CP. She denies any prior episodes of the same and daughter denies any prior diagnoses of A. Fib. Daughter denies that she has been seen by a Cardiologist before. Pt denies any palpations, lightheadedness, dizziness, nausea and emesis as associated symptoms. She has a h/o HTN, HLD and CA. She denies smoking and alcohol use.  PCP is Dr. Lilyan Punt  Past Medical History  Diagnosis Date  . Thyroid disease   . Hypertension   . Gout   . Dementia   . Vertigo   . Low back pain   . Hypothyroidism   . Arthritis   . Hyperlipidemia   . Hemorrhoids with complication   . Cancer 2012    Melanoma-Face Removed at [facility]  . Bladder cancer     saw dr. Earlene Plater had surgery had follow about 3 years ago    Past Surgical History  Procedure Laterality Date  . Cholecystectomy    . Colonoscopy  11/21/09    Simple adenoma/pancolonic diverticulosis(next on due in 5/10 years)  . Eye surgery      Bilateral Cataract Surgery  . Flexible sigmoidoscopy  12/03/2011    Procedure: FLEXIBLE SIGMOIDOSCOPY;  Surgeon: West Bali, MD;  Location: [facility];  Service: Endoscopy;  Laterality: N/A;  12:00  . Bladder surgery      remove cancer  . Melanoma excision      on face    Family History  Problem Relation Age of  Onset  . Migraines Mother   . Arthritis Father   . Diabetes Father   . Cancer Brother   . Breast cancer Daughter     History  Substance Use Topics  . Smoking status: Never Smoker   . Smokeless tobacco: Never Used  . Alcohol Use: No    No OB history provided.  Review of Systems  Respiratory: Positive for shortness of breath. Negative for cough.   Cardiovascular: Positive for chest pain. Negative for palpitations.  Gastrointestinal: Negative for nausea and vomiting.  Neurological: Negative for dizziness and light-headedness.  All other systems reviewed and are negative.    Allergies  Sulfonamide derivatives  Home Medications   Current Outpatient Rx  Name  Route  Sig  Dispense  Refill  . allopurinol (ZYLOPRIM) 100 MG tablet   Oral   Take 1 tablet (100 mg total) by mouth daily.   30 tablet   3   . atorvastatin (LIPITOR) 20 MG tablet   Oral   Take 20 mg by mouth daily.           . citalopram (CELEXA) 10 MG tablet   Oral   Take 1 tablet (10 mg total) by mouth daily.   30 tablet   2   .  gabapentin (NEURONTIN) 100 MG capsule   Oral   Take 1 capsule (100 mg total) by mouth 2 (two) times daily.   30 capsule   11   . levothyroxine (LEVOTHROID) 100 MCG tablet   Oral   Take 100 mcg by mouth daily.           Marland Kitchen losartan-hydrochlorothiazide (HYZAAR) 100-12.5 MG per tablet   Oral   Take 1 tablet by mouth daily.           . memantine (NAMENDA) 10 MG tablet   Oral   Take 5 mg by mouth 2 (two) times daily. In the morning and at bedtime          . cefPROZIL (CEFZIL) 500 MG tablet   Oral   Take 1 tablet (500 mg total) by mouth 2 (two) times daily.   14 tablet   0   . fluconazole (DIFLUCAN) 150 MG tablet      Take 1 now and 1 in 3 days if needed   2 tablet   0   . hydrocortisone (ANUSOL-HC) 2.5 % rectal cream      USE TOP ON RECTUM FOUR TIMES A DAY FOR 10 DAYS   30 g   0     MAY USE SUBSTITUTE IF ANUSOL NOT ON FORMULARY   .  nystatin-triamcinolone (MYCOLOG II) cream   Topical   Apply topically 2 (two) times daily.   30 g   1   . tiZANidine (ZANAFLEX) 2 MG tablet                 Triage Vitals: BP 87/65  Pulse 170  Resp 18  SpO2 100%  Physical Exam  Nursing note and vitals reviewed. Constitutional: She is oriented to person, place, and time. She appears well-developed and well-nourished. No distress.  HENT:  Head: Normocephalic and atraumatic.  Eyes: Conjunctivae and EOM are normal.  Neck: Neck supple. No tracheal deviation present.  Cardiovascular: Regular rhythm.  Tachycardia present.   HR is 166, weak peripheral pulses bilaterally  Pulmonary/Chest: Effort normal and breath sounds normal. No respiratory distress.  Abdominal: Soft. There is no tenderness.  Musculoskeletal: Normal range of motion. She exhibits edema (minimal symmetric tibial edema ).  Neurological: She is alert and oriented to person, place, and time.  Skin: Skin is warm and dry.  Psychiatric: She has a normal mood and affect. Her behavior is normal.    ED Course  CARDIOVERSION Date/Time: 11/18/2012 11:25 PM Performed by: Sunnie Nielsen Authorized by: Sunnie Nielsen Consent: Verbal consent obtained. Risks and benefits: risks, benefits and alternatives were discussed Consent given by: patient Patient understanding: patient states understanding of the procedure being performed Patient consent: the patient's understanding of the procedure matches consent given Procedure consent: procedure consent matches procedure scheduled Required items: required blood products, implants, devices, and special equipment available Patient identity confirmed: verbally with patient Time out: Immediately prior to procedure a "time out" was called to verify the correct patient, procedure, equipment, support staff and site/side marked as required. Cardioversion basis: emergent Pre-procedure rhythm: narrow complex regular tachycardia rate  165. Electrodes: pads Electrodes placed: anterior-posterior Post-procedure rhythm: normal sinus rhythm Complications: no complications Patient tolerance: Patient tolerated the procedure well with no immediate complications. Comments: Adenosine 6mg  IVP and converted to NSR ECG below:   Date: 11/18/2012  Rate: 76  Rhythm: normal sinus rhythm  QRS Axis: normal  Intervals: normal  ST/T Wave abnormalities: nonspecific ST changes  Conduction Disutrbances:none  Narrative Interpretation:  Old EKG Reviewed: unchanged  Within minutes went into rapid afib rate 120-130s   Date: 11/18/2012  Rate: 131  Rhythm: atrial fibrillation  QRS Axis: normal  Intervals: normal  ST/T Wave abnormalities: nonspecific ST changes  Conduction Disutrbances:none  Narrative Interpretation:   Old EKG Reviewed: changes noted - new afib       (including critical care time)  DIAGNOSTIC STUDIES: Oxygen Saturation is 100% on room air, normal by my interpretation.    COORDINATION OF CARE: 11:23 PM- Administered Cardizem. Pt's HR dropped to 66 at the lowest then return to a rate of 122. BP increased from 76/41 to 123/85.  11:25 PM- Discussed admission, consult with Cardiology, CXR and blood work with daughter and pt and both agreed.      CRITICAL CARE Performed by: Sunnie Nielsen   Total critical care time: 45  Critical care time was exclusive of separately billable procedures and treating other patients.  Critical care was necessary to treat or prevent imminent or life-threatening deterioration.  Critical care was time spent personally by me on the following activities: development of treatment plan with patient and/or surrogate as well as nursing, discussions with consultants, evaluation of patient's response to treatment, examination of patient, obtaining history from patient or surrogate, ordering and performing treatments and interventions, ordering and review of laboratory studies, ordering and  review of radiographic studies, pulse oximetry and re-evaluation of patient's condition.  Results for orders placed during the hospital encounter of 11/18/12  CBC      Result Value Range   WBC 8.5  4.0 - 10.5 K/uL   RBC 4.51  3.87 - 5.11 MIL/uL   Hemoglobin 13.6  12.0 - 15.0 g/dL   HCT 16.1  09.6 - 04.5 %   MCV 86.3  78.0 - 100.0 fL   MCH 30.2  26.0 - 34.0 pg   MCHC 35.0  30.0 - 36.0 g/dL   RDW 40.9  81.1 - 91.4 %   Platelets 207  150 - 400 K/uL  POCT I-STAT TROPONIN I      Result Value Range   Troponin i, poc 0.00  0.00 - 0.08 ng/mL   Comment 3           POCT I-STAT, CHEM 8      Result Value Range   Sodium 132 (*) 135 - 145 mEq/L   Potassium 3.5  3.5 - 5.1 mEq/L   Chloride 99  96 - 112 mEq/L   BUN 13  6 - 23 mg/dL   Creatinine, Ser 7.82  0.50 - 1.10 mg/dL   Glucose, Bld 956 (*) 70 - 99 mg/dL   Calcium, Ion 2.13 (*) 1.13 - 1.30 mmol/L   TCO2 22  0 - 100 mmol/L   Hemoglobin 13.9  12.0 - 15.0 g/dL   HCT 08.6  57.8 - 46.9 %   Dg Chest Portable 1 View  11/18/2012  *RADIOLOGY REPORT*  Clinical Data: Chest pain, hypotension and tachycardia.  PORTABLE CHEST - 1 VIEW  Comparison: Chest radiograph performed 04/03/2011  Findings: The lungs are well-aerated.  Minimal vague right basilar opacity may reflect atelectasis or possibly mild pneumonia.  There is no evidence of pleural effusion or pneumothorax.  The cardiomediastinal silhouette is borderline normal in size.  No acute osseous abnormalities are seen.  IMPRESSION: Minimal vague right basilar opacity may reflect atelectasis or possibly mild pneumonia.   Original Report Authenticated By: Tonia Ghent, M.D.    CXR reviewed - no F/C cough or productive sputum  MDM  Shortness of breath and palpitations presented with new onset atrial fibrillation and significant hypotension  IV fluids. Chemical cardioversion with adenosine. Rate control with IV diltiazem drip  Serial EKGs. Serial evaluations, improving condition with rate  control  Chest x-ray, labs and medical admission to ICU.    I personally performed the services described in this documentation, which was scribed in my presence. The recorded information has been reviewed and is accurate.     Sunnie Nielsen, MD 11/19/12 [phone]

## 2012-11-18 NOTE — ED Notes (Signed)
Pt states she had just lay down to sleep when she had onset of severe pain in center of chest.  Pt also reports nausea.  On arrival, pt denies pain at this time.

## 2012-11-19 DIAGNOSIS — R197 Diarrhea, unspecified: Secondary | ICD-10-CM

## 2012-11-19 DIAGNOSIS — N390 Urinary tract infection, site not specified: Secondary | ICD-10-CM | POA: Diagnosis present

## 2012-11-19 DIAGNOSIS — E119 Type 2 diabetes mellitus without complications: Secondary | ICD-10-CM

## 2012-11-19 DIAGNOSIS — F411 Generalized anxiety disorder: Secondary | ICD-10-CM

## 2012-11-19 DIAGNOSIS — J45909 Unspecified asthma, uncomplicated: Secondary | ICD-10-CM

## 2012-11-19 DIAGNOSIS — I4891 Unspecified atrial fibrillation: Principal | ICD-10-CM

## 2012-11-19 LAB — BASIC METABOLIC PANEL
BUN: 11 mg/dL (ref 6–23)
CO2: 20 mEq/L (ref 19–32)
Calcium: 9.5 mg/dL (ref 8.4–10.5)
Creatinine, Ser: 0.69 mg/dL (ref 0.50–1.10)
Glucose, Bld: 141 mg/dL — ABNORMAL HIGH (ref 70–99)
Sodium: 135 mEq/L (ref 135–145)

## 2012-11-19 LAB — URINALYSIS, ROUTINE W REFLEX MICROSCOPIC
Leukocytes, UA: NEGATIVE
Nitrite: NEGATIVE
Protein, ur: NEGATIVE mg/dL
Specific Gravity, Urine: <1.005 — ABNORMAL LOW (ref 1.005–1.030)
Urobilinogen, UA: 0.2 mg/dL (ref 0.0–1.0)

## 2012-11-19 LAB — PRO B NATRIURETIC PEPTIDE: Pro B Natriuretic peptide (BNP): 67.3 pg/mL (ref 0–450)

## 2012-11-19 LAB — TSH: TSH: 4.695 u[IU]/mL — ABNORMAL HIGH (ref 0.350–4.500)

## 2012-11-19 LAB — GLUCOSE, CAPILLARY: Glucose-Capillary: 112 mg/dL — ABNORMAL HIGH (ref 70–99)

## 2012-11-19 LAB — HEPATIC FUNCTION PANEL
ALT: 43 U/L — ABNORMAL HIGH (ref 0–35)
AST: 43 U/L — ABNORMAL HIGH (ref 0–37)
Alkaline Phosphatase: 64 U/L (ref 39–117)
Bilirubin, Direct: 0.1 mg/dL (ref 0.0–0.3)

## 2012-11-19 LAB — MRSA PCR SCREENING: MRSA by PCR: NEGATIVE

## 2012-11-19 LAB — T4, FREE: Free T4: 1.37 ng/dL (ref 0.80–1.80)

## 2012-11-19 LAB — TROPONIN I: Troponin I: <0.3 ng/mL (ref <0.30)

## 2012-11-19 LAB — CREATININE, SERUM: Creatinine, Ser: 0.84 mg/dL (ref 0.50–1.10)

## 2012-11-19 MED ORDER — ALLOPURINOL 100 MG PO TABS
100.0000 mg | ORAL_TABLET | Freq: Every day | ORAL | Status: DC
Start: 2012-11-19 — End: 2012-11-19
  Administered 2012-11-19: 100 mg via ORAL
  Filled 2012-11-19: qty 1

## 2012-11-19 MED ORDER — TIZANIDINE HCL 2 MG PO TABS
2.0000 mg | ORAL_TABLET | Freq: Every day | ORAL | Status: DC | PRN
  Filled 2012-11-19: qty 1

## 2012-11-19 MED ORDER — ONDANSETRON HCL 4 MG PO TABS: 4.0000 mg | ORAL_TABLET | Freq: Four times a day (QID) | ORAL | Status: DC | PRN

## 2012-11-19 MED ORDER — ACETAMINOPHEN 650 MG RE SUPP: 650.0000 mg | Freq: Four times a day (QID) | RECTAL | Status: DC | PRN

## 2012-11-19 MED ORDER — ALUM & MAG HYDROXIDE-SIMETH 200-200-20 MG/5ML PO SUSP: 30.0000 mL | Freq: Four times a day (QID) | ORAL | Status: DC | PRN

## 2012-11-19 MED ORDER — MORPHINE SULFATE 2 MG/ML IJ SOLN: 2.0000 mg | INTRAMUSCULAR | Status: DC | PRN

## 2012-11-19 MED ORDER — MEMANTINE HCL 10 MG PO TABS
10.0000 mg | ORAL_TABLET | Freq: Two times a day (BID) | ORAL | Status: DC
  Administered 2012-11-19: 10 mg via ORAL
  Filled 2012-11-19: qty 1

## 2012-11-19 MED ORDER — ONDANSETRON HCL 4 MG/2ML IJ SOLN: 4.0000 mg | Freq: Four times a day (QID) | INTRAMUSCULAR | Status: DC | PRN

## 2012-11-19 MED ORDER — ENOXAPARIN SODIUM 30 MG/0.3ML ~~LOC~~ SOLN
30.0000 mg | SUBCUTANEOUS | Status: DC
  Administered 2012-11-19: 30 mg via SUBCUTANEOUS
  Filled 2012-11-19: qty 0.3

## 2012-11-19 MED ORDER — LEVOTHYROXINE SODIUM 100 MCG PO TABS
100.0000 ug | ORAL_TABLET | Freq: Every day | ORAL | Status: DC
  Administered 2012-11-19: 100 ug via ORAL
  Filled 2012-11-19: qty 1

## 2012-11-19 MED ORDER — SODIUM CHLORIDE 0.9 % IV SOLN
INTRAVENOUS | Status: DC
  Administered 2012-11-19: 03:00:00 via INTRAVENOUS

## 2012-11-19 MED ORDER — ASPIRIN EC 81 MG PO TBEC
81.0000 mg | DELAYED_RELEASE_TABLET | Freq: Every day | ORAL | Status: DC
  Administered 2012-11-19: 81 mg via ORAL
  Filled 2012-11-19: qty 1

## 2012-11-19 MED ORDER — SODIUM CHLORIDE 0.9 % IJ SOLN
3.0000 mL | Freq: Two times a day (BID) | INTRAMUSCULAR | Status: DC
  Administered 2012-11-19: 3 mL via INTRAVENOUS

## 2012-11-19 MED ORDER — GABAPENTIN 100 MG PO CAPS: 100.0000 mg | ORAL_CAPSULE | Freq: Every day | ORAL | Status: DC

## 2012-11-19 MED ORDER — FLUCONAZOLE 100 MG PO TABS
100.0000 mg | ORAL_TABLET | Freq: Every day | ORAL | Status: DC
  Administered 2012-11-19: 100 mg via ORAL
  Filled 2012-11-19: qty 1

## 2012-11-19 MED ORDER — ACETAMINOPHEN 325 MG PO TABS: 650.0000 mg | ORAL_TABLET | Freq: Four times a day (QID) | ORAL | Status: DC | PRN

## 2012-11-19 MED ORDER — ATORVASTATIN CALCIUM 20 MG PO TABS: 20.0000 mg | ORAL_TABLET | Freq: Every day | ORAL | Status: DC

## 2012-11-19 MED ORDER — INSULIN ASPART 100 UNIT/ML ~~LOC~~ SOLN
0.0000 [IU] | Freq: Three times a day (TID) | SUBCUTANEOUS | Status: DC
  Administered 2012-11-19: 1 [IU] via SUBCUTANEOUS

## 2012-11-19 MED ORDER — SENNA 8.6 MG PO TABS
1.0000 | ORAL_TABLET | Freq: Two times a day (BID) | ORAL | Status: DC
  Administered 2012-11-19: 8.6 mg via ORAL
  Filled 2012-11-19 (×2): qty 1

## 2012-11-19 MED ORDER — NYSTATIN-TRIAMCINOLONE 100000-0.1 UNIT/GM-% EX CREA: TOPICAL_CREAM | Freq: Two times a day (BID) | CUTANEOUS | Status: DC | PRN

## 2012-11-19 MED ORDER — DEXTROSE 5 % IV SOLN
1.0000 g | INTRAVENOUS | Status: DC
  Administered 2012-11-19: 1 g via INTRAVENOUS
  Filled 2012-11-19 (×2): qty 10

## 2012-11-19 NOTE — ED Notes (Signed)
Pt assisted to bedpan by female nurse.

## 2012-11-19 NOTE — Progress Notes (Signed)
     Subjective: This lady was admitted overnight with atrial fibrillation with rapid ventricular response. Fortunately, with intravenous adenosine, she appears to have cardioverted back to sinus rhythm. She denies any symptoms of a upper respiratory tract infection or pneumonia. She has had a UTI. She feels perfectly well now. She is keen to go home. Serial cardiac enzymes are negative.           Physical Exam: Blood pressure 128/70, pulse 61, resp. rate 15, height 5\' 3"  (1.6 m), weight 87.9 kg (193 lb 12.6 oz), SpO2 100.00%. She looks systemically well. She is obese. Heart sounds are present in sinus rhythm. There are no murmurs. There is no gallop rhythm. Lung fields are clear. Jugular venous pressure not elevated. There is no evidence of heart failure. There are no pleural rubs. She is alert and oriented.   Investigations:  No results found for this or any previous visit (from the past 240 hour(s)).   Basic Metabolic Panel:  Recent Labs  19/14/78 2335 11/19/12 0218 11/19/12 0434  NA 132*  --  135  K 3.5  --  3.3*  CL 99  --  99  CO2  --   --  20  GLUCOSE 225*  --  141*  BUN 13  --  11  CREATININE 0.90 0.84 0.69  CALCIUM  --   --  9.5  MG  --  1.8  --    Liver Function Tests:  Recent Labs  11/19/12 0454  AST 43*  ALT 43*  ALKPHOS 64  BILITOT 0.6  PROT 7.2  ALBUMIN 3.8     CBC:  Recent Labs  11/18/12 2306 11/18/12 2335  WBC 8.5  --   HGB 13.6 13.9  HCT 38.9 41.0  MCV 86.3  --   PLT 207  --     Dg Chest Portable 1 View  11/18/2012  *RADIOLOGY REPORT*  Clinical Data: Chest pain, hypotension and tachycardia.  PORTABLE CHEST - 1 VIEW  Comparison: Chest radiograph performed 04/03/2011  Findings: The lungs are well-aerated.  Minimal vague right basilar opacity may reflect atelectasis or possibly mild pneumonia.  There is no evidence of pleural effusion or pneumothorax.  The cardiomediastinal silhouette is borderline normal in size.  No acute osseous  abnormalities are seen.  IMPRESSION: Minimal vague right basilar opacity may reflect atelectasis or possibly mild pneumonia.   Original Report Authenticated By: Tonia Ghent, M.D.       Medications: I have reviewed the patient's current medications.  Impression: 1. Paroxysmal atrial fibrillation, now in sinus rhythm. This will be her first episode, that was documented. 2. Recent UTI. 3. Mild dementia.     Plan: 1. Discontinue diltiazem drip and observe. 2. If she remains in sinus rhythm next few hours, and she should be safe to discharge him. She may or may not need outpatient investigation with echocardiogram and other tests but I am not sure this is warranted at this point.     LOS: 1 day   Wilson Singer Pager [phone]  11/19/2012, 7:38 AM

## 2012-11-19 NOTE — Progress Notes (Signed)
UR Chart Review Completed  

## 2012-11-19 NOTE — H&P (Addendum)
[facility] History and Physical  [name]:[id number] DOB: [birth year] DOA: 11/18/2012  Referring physician: EDP Dr. Dierdre Highman PCP: Lilyan Punt, MD  Specialists: none  Chief Complaint: Chest pain and palpitations  HPI: [name] is a 79 y.o. female with recurring urinary tract infections, mild vascular dementia, chronic low back pain, hypertension, and thyroid disease who presents to the emergency department complaining of acute onset chest pain and heart palpitations. In the emergency department she was found to be in A. fib with RVR with a heart rate in the 70s on arrival, she was given an amp of adenosine, and started on a diltiazem drip which improved her heart rate into the 60s and her blood pressure normalized. The patient has never had an episode of atrial fibrillation before, she denies any acute illness however per her daughter she has recently been treated with antibiotics for urinary tract infection and also for an extensive vaginal and vulvar yeast infection with oral fluconazole. She denies any fever chills nausea vomiting diarrhea or productive cough at baseline the patient says she has a chronic cough with clear to white sputum only. She denies any dysphasia difficulty eating or reduced appetite. She has not had any extremity swelling. Chest pain resolved in the emergency department once her heart rate was controlled. Per her daughter she has baseline mild dementia she still lives alone significant assistance from her daughter who lives next door, patient is prone to being anxious and agitated and has difficulty sleeping which has been worse in the past few days. Patient also says that she has had profuse diarrhea for the past 2-3 days with 4-5 loose watery stools daily, he was instructed by her primary care doctor to discontinue the oral antibiotics for the urinary tract infection.  Review of Systems: The patient denies anorexia, fever, weight loss,, vision loss, decreased  hearing, hoarseness,  syncope, dyspnea on exertion, peripheral edema, balance deficits, hemoptysis, abdominal pain, melena, hematochezia, severe indigestion/heartburn, hematuria, incontinence, muscle weakness, suspicious skin lesions, transient blindness, difficulty walking, unusual weight change, abnormal bleeding, enlarged lymph nodes, angioedema, and breast masses.  Past Medical History  Diagnosis Date  . Thyroid disease   . Hypertension   . Gout   . Dementia   . Vertigo   . Low back pain   . Hypothyroidism   . Arthritis   . Hyperlipidemia   . Hemorrhoids with complication   . Cancer 2012    Melanoma-Face Removed at [facility]  . Bladder cancer     saw dr. Earlene Plater had surgery had follow about 3 years ago   Past Surgical History  Procedure Laterality Date  . Cholecystectomy    . Colonoscopy  11/21/09    Simple adenoma/pancolonic diverticulosis(next on due in 5/10 years)  . Eye surgery      Bilateral Cataract Surgery  . Flexible sigmoidoscopy  12/03/2011    Procedure: FLEXIBLE SIGMOIDOSCOPY;  Surgeon: West Bali, MD;  Location: [facility];  Service: Endoscopy;  Laterality: N/A;  12:00  . Bladder surgery      remove cancer  . Melanoma excision      on face   Social History:  reports that she has never smoked. She has never used smokeless tobacco. She reports that she does not drink alcohol or use illicit drugs.   Allergies  Allergen Reactions  . Sulfonamide Derivatives     REACTION: swelling, difficulty swallowing    Family History  Problem Relation Age of Onset  . Migraines Mother   .  Arthritis Father   . Diabetes Father   . Cancer Brother   . Breast cancer Daughter     Prior to Admission medications   Medication Sig Start Date End Date Taking? Authorizing Provider  allopurinol (ZYLOPRIM) 100 MG tablet Take 1 tablet (100 mg total) by mouth daily. 11/03/12  Yes Babs Sciara, MD  atorvastatin (LIPITOR) 20 MG tablet Take 20 mg by mouth at bedtime.    Yes  Historical Provider, MD  citalopram (CELEXA) 10 MG tablet Take 1 tablet (10 mg total) by mouth daily. 11/05/12 11/05/13 Yes Merlyn Albert, MD  gabapentin (NEURONTIN) 100 MG capsule Take 100 mg by mouth at bedtime. 11/05/12  Yes Merlyn Albert, MD  Lactobacillus (ACIDOPHILUS PO) Take 1 capsule by mouth daily.   Yes Historical Provider, MD  levothyroxine (LEVOTHROID) 100 MCG tablet Take 100 mcg by mouth daily.     Yes Historical Provider, MD  losartan-hydrochlorothiazide (HYZAAR) 100-12.5 MG per tablet Take 1 tablet by mouth daily.     Yes Historical Provider, MD  memantine (NAMENDA) 10 MG tablet Take 10 mg by mouth 2 (two) times daily. In the morning and at bedtime   Yes Historical Provider, MD  nystatin-triamcinolone (MYCOLOG II) cream Apply topically 2 (two) times daily. 11/14/12  Yes Adline Potter, NP  triamcinolone cream (KENALOG) 0.1 % Apply 1 application topically daily as needed. For irritaiton 11/13/12  Yes Historical Provider, MD  tiZANidine (ZANAFLEX) 2 MG tablet Take 2 mg by mouth daily as needed (migraine pain).  01/14/12   Historical Provider, MD   Physical Exam: Filed Vitals:   11/19/12 0133 11/19/12 0230 11/19/12 0242 11/19/12 0300  BP: 140/77  120/60 121/67  Pulse: 68 64 69 70  TempSrc:      Resp: 20 15 16 15   Height:  5\' 3"  (1.6 m)    Weight:  87.9 kg (193 lb 12.6 oz)    SpO2: 100% 99% 100% 100%     General:  Mildly ill-appearing elderly woman, reports feeling cold and she is covered of multiple blankets but is not shivering. Alert and oriented to person and place, family is at bedside  Eyes: Pupils equal round and reactive to light, EOMI  ENT: Dry mucous membranes poor dentition  Neck: Supple no adenopathy, thyromegaly  Cardiovascular: Irregular, bradycardic no murmurs rubs or gallops  Respiratory: Decreased breath sounds in bilateral bases, soft anterior wheezes, no crackles or rhonchi  Abdomen: Soft nontender  Skin: Trace edema in the lower extremities,  varicose veins no rashes  Musculoskeletal: Moves all 4 extremities, or swollen joints  Psychiatric: Appropriate  Neurologic: Nonfocal  Labs on Admission:  Basic Metabolic Panel:  Recent Labs Lab 11/18/12 2335 11/19/12 0218  NA 132*  --   K 3.5  --   CL 99  --   GLUCOSE 225*  --   BUN 13  --   CREATININE 0.90 0.84  MG  --  1.8   CBC:  Recent Labs Lab 11/18/12 2306 11/18/12 2335  WBC 8.5  --   HGB 13.6 13.9  HCT 38.9 41.0  MCV 86.3  --   PLT 207  --    Cardiac Enzymes: No results found for this basename: CKTOTAL, CKMB, CKMBINDEX, TROPONINI,  in the last 168 hours  BNP (last 3 results)  Recent Labs  11/19/12 0103  PROBNP 67.3   CBG: No results found for this basename: GLUCAP,  in the last 168 hours  Radiological Exams on Admission: Dg Chest Portable 1  View  11/18/2012  *RADIOLOGY REPORT*  Clinical Data: Chest pain, hypotension and tachycardia.  PORTABLE CHEST - 1 VIEW  Comparison: Chest radiograph performed 04/03/2011  Findings: The lungs are well-aerated.  Minimal vague right basilar opacity may reflect atelectasis or possibly mild pneumonia.  There is no evidence of pleural effusion or pneumothorax.  The cardiomediastinal silhouette is borderline normal in size.  No acute osseous abnormalities are seen.  IMPRESSION: Minimal vague right basilar opacity may reflect atelectasis or possibly mild pneumonia.   Original Report Authenticated By: Tonia Ghent, M.D.     EKG: Independently reviewed. AFib, RVR  Assessment/Plan #1 Atrial fibrillation with rapid ventricular rate, controlled with IV diltiazem #2 Recurrent UTI, recent antibiotic use #3 Abnormal chest x-ray showing rightbasilar opacity, no clinical signs of pneumonia #4 Vulvovaginitis, secondary to yeast infection, treated with one time dose of Diflucan #5 Hyperlipidemia #6 History of bladder cancer, removed locally under cystoscopy #7 History of melanoma of the face #8 Thyroid disease #9 Vascular  dementia, mild #10 Depression/Anxiety #11 Hypertension #12 Gout #13 Chronic low back pain  The patient is an 79 year old with new onset atrial fibrillation, now under control with diltiazem IV 5mg /hr. She is now hemodynamically stable, and will probably be ready for transition to an oral regimen in the next 24 hours. A workup is pending to determine the etiology of atrial fibrillation, suspect this is paroxysmal atrial fibrillation related to an underlying infection or other medical problem. She has recently been treated for a urinary tract infection and developed moderate to severe diarrhea while taking antibiotics. No evidence of MI, troponins negative and no ischemia on rate controlled EKG.   Admitted to step down for IV diltiazem and heart rate monitoring  TSH, UA with culture, C. difficile PCR  Cycling troponins  Resumed home meds, holding antihypertensives  Will continue course of oral fluconazole   Empiric Rocephin for urinary tract infection until urine is obtained.   Code Status: Full Code Family Communication: Discussed plan of care with patient and daughter  Disposition Plan: Admitted under inoatient  Time spent: 70 minutes  [facility] Pager [phone]  If 7PM-7AM, please contact night-coverage [email or web address] Password [email or web address] 11/19/2012, 4:47 AM

## 2012-11-19 NOTE — ED Notes (Signed)
Patient up to bedside commode with assist tolerated well

## 2012-11-19 NOTE — Discharge Summary (Signed)
Physician Discharge Summary  [name] ZOX:[id number] DOB: February 05, 1930 DOA: 11/18/2012  PCP: Lilyan Punt, MD  Admit date: 11/18/2012 Discharge date: 11/19/2012  Time spent: Greater than 30 minutes  Recommendations for Outpatient Follow-up:  1. Followup with primary care physician in one week. May need cardiology referral as an outpatient.   Discharge Diagnoses:  1. Paroxysmal atrial fibrillation, first episode, resolved with intravenous adenosine. No evidence of cardiac ischemia or infarction. 2. UTI, Escherichia coli, being treated. 3. Hypertension. 4. Mild dementia.   Discharge Condition: Stable.  Diet recommendation: Regular.  Filed Weights   11/19/12 0230  Weight: 87.9 kg (193 lb 12.6 oz)    History of present illness:  This very pleasant 79 year old lady presented to the hospital with symptoms of palpitations. Please see initial history as outlined below: HPI: [name] is a 79 y.o. female with recurring urinary tract infections, mild vascular dementia, chronic low back pain, hypertension, and thyroid disease who presents to the emergency department complaining of acute onset chest pain and heart palpitations. In the emergency department she was found to be in A. fib with RVR with a heart rate in the 70s on arrival, she was given an amp of adenosine, and started on a diltiazem drip which improved her heart rate into the 60s and her blood pressure normalized. The patient has never had an episode of atrial fibrillation before, she denies any acute illness however per her daughter she has recently been treated with antibiotics for urinary tract infection and also for an extensive vaginal and vulvar yeast infection with oral fluconazole. She denies any fever chills nausea vomiting diarrhea or productive cough at baseline the patient says she has a chronic cough with clear to white sputum only. She denies any dysphasia difficulty eating or reduced appetite. She has not had any  extremity swelling. Chest pain resolved in the emergency department once her heart rate was controlled. Per her daughter she has baseline mild dementia she still lives alone significant assistance from her daughter who lives next door, patient is prone to being anxious and agitated and has difficulty sleeping which has been worse in the past few days. Patient also says that she has had profuse diarrhea for the past 2-3 days with 4-5 loose watery stools daily, he was instructed by her primary care doctor to discontinue the oral antibiotics for the urinary tract infection.  Hospital Course:  The patient was admitted and given intravenous adenosine in the emergency room. This converted her back to sinus rhythm with fairly promptly. She was put on a diltiazem drip also. When I saw her earlier this morning approximately 5 hours ago, she was also in sinus rhythm. The Cardizem drip was discontinued and she was monitored. Since this time, she has had no further recurrence of atrial fibrillation. Serial cardiac enzymes are negative. She has no further symptoms. I think she is stable to be discharged from the hospital, she needs to followup with her primary care physician in the office soon and a decision can be made as to whether she is she does require followup with cardiology or not.  Procedures:  IV adenosine for cardioversion given by emergency room physician.   Consultations:  None.  Discharge Exam: Filed Vitals:   11/19/12 0630 11/19/12 0742 11/19/12 0810 11/19/12 1152  BP: 128/70     Pulse: 61     Temp:  97.9 F (36.6 C)  98.5 F (36.9 C)  TempSrc:  Oral  Oral  Resp: 15  Height:      Weight:      SpO2: 100%  100%     General: She looks systemically well. She is obese. Cardiovascular: Heart sounds are present and clearly in sinus rhythm. Jugular venous pressure is not elevated. There is no evidence of heart failure. Respiratory: Lung fields are clear. There is no pleural rub. There are  no crackles, wheezing or bronchial breathing. She is alert and orientated.  Discharge Instructions  Discharge Orders   Future Orders Complete By Expires     Diet - low sodium heart healthy  As directed     Increase activity slowly  As directed         Medication List    TAKE these medications       ACIDOPHILUS PO  Take 1 capsule by mouth daily.     allopurinol 100 MG tablet  Commonly known as:  ZYLOPRIM  Take 1 tablet (100 mg total) by mouth daily.     atorvastatin 20 MG tablet  Commonly known as:  LIPITOR  Take 20 mg by mouth at bedtime.     citalopram 10 MG tablet  Commonly known as:  CELEXA  Take 1 tablet (10 mg total) by mouth daily.     gabapentin 100 MG capsule  Commonly known as:  NEURONTIN  Take 100 mg by mouth at bedtime.     LEVOTHROID 100 MCG tablet  Generic drug:  levothyroxine  Take 100 mcg by mouth daily.     losartan-hydrochlorothiazide 100-12.5 MG per tablet  Commonly known as:  HYZAAR  Take 1 tablet by mouth daily.     memantine 10 MG tablet  Commonly known as:  NAMENDA  Take 10 mg by mouth 2 (two) times daily. In the morning and at bedtime     nystatin-triamcinolone cream  Commonly known as:  MYCOLOG II  Apply topically 2 (two) times daily.     tiZANidine 2 MG tablet  Commonly known as:  ZANAFLEX  Take 2 mg by mouth daily as needed (migraine pain).     triamcinolone cream 0.1 %  Commonly known as:  KENALOG  Apply 1 application topically daily as needed. For irritaiton           Follow-up Information   Follow up with LUKING,SCOTT, MD. Schedule an appointment as soon as possible for a visit in 1 week.   Contact information:   [address] [place] [address] [phone]        The results of significant diagnostics from this hospitalization (including imaging, microbiology, ancillary and laboratory) are listed below for reference.    Significant Diagnostic Studies: Dg Chest Portable 1 View  11/18/2012   *RADIOLOGY REPORT*  Clinical Data: Chest pain, hypotension and tachycardia.  PORTABLE CHEST - 1 VIEW  Comparison: Chest radiograph performed 04/03/2011  Findings: The lungs are well-aerated.  Minimal vague right basilar opacity may reflect atelectasis or possibly mild pneumonia.  There is no evidence of pleural effusion or pneumothorax.  The cardiomediastinal silhouette is borderline normal in size.  No acute osseous abnormalities are seen.  IMPRESSION: Minimal vague right basilar opacity may reflect atelectasis or possibly mild pneumonia.   Original Report Authenticated By: Tonia Ghent, M.D.     Microbiology: Recent Results (from the past 240 hour(s))  MRSA PCR SCREENING     Status: None   Collection Time    11/19/12  1:50 AM      Result Value Range Status   MRSA by PCR  NEGATIVE  NEGATIVE Final   Comment:            The GeneXpert MRSA Assay (FDA     approved for NASAL specimens     only), is one component of a     comprehensive MRSA colonization     surveillance program. It is not     intended to diagnose MRSA     infection nor to guide or     monitor treatment for     MRSA infections.     Labs: Basic Metabolic Panel:  Recent Labs Lab 11/18/12 2335 11/19/12 0218 11/19/12 0434  NA 132*  --  135  K 3.5  --  3.3*  CL 99  --  99  CO2  --   --  20  GLUCOSE 225*  --  141*  BUN 13  --  11  CREATININE 0.90 0.84 0.69  CALCIUM  --   --  9.5  MG  --  1.8  --    Liver Function Tests:  Recent Labs Lab 11/19/12 0454  AST 43*  ALT 43*  ALKPHOS 64  BILITOT 0.6  PROT 7.2  ALBUMIN 3.8     CBC:  Recent Labs Lab 11/18/12 2306 11/18/12 2335  WBC 8.5  --   HGB 13.6 13.9  HCT 38.9 41.0  MCV 86.3  --   PLT 207  --    Cardiac Enzymes:  Recent Labs Lab 11/19/12 0434  TROPONINI <0.30   BNP: BNP (last 3 results)  Recent Labs  11/19/12 0103  PROBNP 67.3   CBG:  Recent Labs Lab 11/19/12 0722 11/19/12 1130  GLUCAP 150* 112*        Signed:  Tymarion Everard C  [facility] 11/19/2012, 12:05 PM

## 2012-11-19 NOTE — ED Notes (Signed)
Pt remains on cardiac monitor w/ NIBP vital signs WNL. NAD noted. Pt states she does fell better at this time. Family in room w/ pt. Bed in low position & side rails up x 2. No needs voiced at this time.

## 2012-11-19 NOTE — Progress Notes (Signed)
Patient and patients daughter given discharge instructions. Patient and daughter verbalize understanding. PCP's office closed for lunch. Instructed to call this afternoon and make follow up appointment. Patient alert, oriented and in stable condition at the time of discharge. Patient discharged home with daughter.

## 2012-11-20 ENCOUNTER — Telehealth: Payer: Self-pay | Admitting: Family Medicine

## 2012-11-20 NOTE — Telephone Encounter (Signed)
Wants to know the results of the Urine Culture that was taken when patient was in the hospital.  Her kidneys are hurting her and she needs to know what to do about it.  Please call as soon as possible.  [facility]

## 2012-11-20 NOTE — Telephone Encounter (Signed)
  No micro or culture was ordered because the dip was neg for everything but ketones on 11/19/12 per hospital records.  Has follow up scheduled with Dr Lorin Picket next week for recent hospitalization for A fib.

## 2012-11-20 NOTE — Telephone Encounter (Signed)
Ok cx not done. rec no rx. f u dr Lorin Picket

## 2012-11-21 NOTE — Telephone Encounter (Signed)
[name] spoke with daughter and advised daughter to call in am to schedule patient an appt. If ongoing problems or concerns.

## 2012-11-22 ENCOUNTER — Encounter (HOSPITAL_COMMUNITY): Payer: Self-pay | Admitting: *Deleted

## 2012-11-22 ENCOUNTER — Emergency Department (HOSPITAL_COMMUNITY)
Admission: EM | Admit: 2012-11-22 | Discharge: 2012-11-22 | Disposition: A | Discharge status: Home / Self Care | Payer: Medicare Other | Attending: Emergency Medicine | Admitting: Emergency Medicine

## 2012-11-22 ENCOUNTER — Emergency Department (HOSPITAL_COMMUNITY): Payer: Medicare Other

## 2012-11-22 DIAGNOSIS — Z8551 Personal history of malignant neoplasm of bladder: Secondary | ICD-10-CM | POA: Insufficient documentation

## 2012-11-22 DIAGNOSIS — E785 Hyperlipidemia, unspecified: Secondary | ICD-10-CM | POA: Insufficient documentation

## 2012-11-22 DIAGNOSIS — I10 Essential (primary) hypertension: Secondary | ICD-10-CM | POA: Insufficient documentation

## 2012-11-22 DIAGNOSIS — F3289 Other specified depressive episodes: Secondary | ICD-10-CM | POA: Insufficient documentation

## 2012-11-22 DIAGNOSIS — R0602 Shortness of breath: Secondary | ICD-10-CM | POA: Insufficient documentation

## 2012-11-22 DIAGNOSIS — M109 Gout, unspecified: Secondary | ICD-10-CM | POA: Insufficient documentation

## 2012-11-22 DIAGNOSIS — G309 Alzheimer's disease, unspecified: Secondary | ICD-10-CM | POA: Insufficient documentation

## 2012-11-22 DIAGNOSIS — F0390 Unspecified dementia without behavioral disturbance: Secondary | ICD-10-CM | POA: Insufficient documentation

## 2012-11-22 DIAGNOSIS — F0280 Dementia in other diseases classified elsewhere without behavioral disturbance: Secondary | ICD-10-CM | POA: Insufficient documentation

## 2012-11-22 DIAGNOSIS — E039 Hypothyroidism, unspecified: Secondary | ICD-10-CM | POA: Insufficient documentation

## 2012-11-22 DIAGNOSIS — F329 Major depressive disorder, single episode, unspecified: Secondary | ICD-10-CM | POA: Insufficient documentation

## 2012-11-22 DIAGNOSIS — Z79899 Other long term (current) drug therapy: Secondary | ICD-10-CM | POA: Insufficient documentation

## 2012-11-22 DIAGNOSIS — Z8679 Personal history of other diseases of the circulatory system: Secondary | ICD-10-CM | POA: Insufficient documentation

## 2012-11-22 HISTORY — DX: Unspecified atrial fibrillation: I48.91

## 2012-11-22 LAB — BASIC METABOLIC PANEL
BUN: 12 mg/dL (ref 6–23)
Creatinine, Ser: 0.72 mg/dL (ref 0.50–1.10)
GFR calc Af Amer: >90 mL/min (ref >90)
GFR calc non Af Amer: 79 mL/min — ABNORMAL LOW (ref >90)
Potassium: 3.7 mEq/L (ref 3.5–5.1)

## 2012-11-22 LAB — CBC
HCT: 38 % (ref 36.0–46.0)
MCHC: 36.6 g/dL — ABNORMAL HIGH (ref 30.0–36.0)
MCV: 83.3 fL (ref 78.0–100.0)
Platelets: 180 10*3/uL (ref 150–400)
RDW: 13.3 % (ref 11.5–15.5)

## 2012-11-22 LAB — PRO B NATRIURETIC PEPTIDE: Pro B Natriuretic peptide (BNP): 90.4 pg/mL (ref 0–450)

## 2012-11-22 MED ORDER — IOHEXOL 350 MG/ML SOLN
100.0000 mL | Freq: Once | INTRAVENOUS | Status: AC | PRN
  Administered 2012-11-22: 100 mL via INTRAVENOUS

## 2012-11-22 MED ORDER — ALPRAZOLAM 0.25 MG PO TABS: 0.5000 mg | ORAL_TABLET | Freq: Every evening | ORAL | Status: DC | PRN

## 2012-11-22 NOTE — ED Notes (Signed)
Pt reports going to [facility] this past week for CP, was in atrial fib and kept overnight and then dc home. Pt reports not feeling right since being in the hospital. Having sob and feels like HR is irregular. spo2 100% at triage.

## 2012-11-22 NOTE — ED Provider Notes (Signed)
History     CSN: [id number]  Arrival date & time 11/22/12  1003   First MD Initiated Contact with Patient 11/22/12 1040      Chief Complaint  Patient presents with  . Shortness of Breath    HPI Pt reports going to [facility] this past [facility], was in atrial fib and kept overnight and then dc home. Pt reports not feeling right since being in the hospital. Having sob and feels like HR is irregular. spo2 100% at triage.  Past Medical History  Diagnosis Date  . Thyroid disease   . Hypertension   . Gout   . Dementia   . Vertigo   . Low back pain   . Hypothyroidism   . Arthritis   . Hyperlipidemia   . Hemorrhoids with complication   . Cancer 2012    Melanoma-Face Removed at [facility]  . Bladder cancer     saw dr. Earlene Plater had surgery had follow about 3 years ago  . Atrial fibrillation     Past Surgical History  Procedure Laterality Date  . Cholecystectomy    . Colonoscopy  11/21/09    Simple adenoma/pancolonic diverticulosis(next on due in 5/10 years)  . Eye surgery      Bilateral Cataract Surgery  . Flexible sigmoidoscopy  12/03/2011    Procedure: FLEXIBLE SIGMOIDOSCOPY;  Surgeon: West Bali, MD;  Location: [facility];  Service: Endoscopy;  Laterality: N/A;  12:00  . Bladder surgery      remove cancer  . Melanoma excision      on face    Family History  Problem Relation Age of Onset  . Migraines Mother   . Arthritis Father   . Diabetes Father   . Cancer Brother   . Breast cancer Daughter     History  Substance Use Topics  . Smoking status: Never Smoker   . Smokeless tobacco: Never Used  . Alcohol Use: No    OB History   Grav Para Term Preterm Abortions TAB SAB Ect Mult Living                  Review of Systems All other systems reviewed and are negative Allergies  Sulfonamide derivatives  Home Medications   Current Outpatient Rx  Name  Route  Sig  Dispense  Refill  . Acetaminophen (ACETAMIN PO)   Oral   Take 2 tablets by mouth daily  as needed (for pain).         Marland Kitchen allopurinol (ZYLOPRIM) 100 MG tablet   Oral   Take 100 mg by mouth daily.         Marland Kitchen atorvastatin (LIPITOR) 20 MG tablet   Oral   Take 20 mg by mouth at bedtime.          . citalopram (CELEXA) 10 MG tablet   Oral   Take 10 mg by mouth daily.         Marland Kitchen gabapentin (NEURONTIN) 100 MG capsule   Oral   Take 100 mg by mouth at bedtime.         . Lactobacillus (ACIDOPHILUS PO)   Oral   Take 1 capsule by mouth daily.         Marland Kitchen levothyroxine (LEVOTHROID) 100 MCG tablet   Oral   Take 100 mcg by mouth daily.           Marland Kitchen losartan-hydrochlorothiazide (HYZAAR) 100-12.5 MG per tablet   Oral   Take 1 tablet by mouth  daily.          . memantine (NAMENDA) 10 MG tablet   Oral   Take by mouth 2 (two) times daily.         Marland Kitchen nystatin-triamcinolone (MYCOLOG II) cream   Topical   Apply 1 application topically 2 (two) times daily.         Marland Kitchen tiZANidine (ZANAFLEX) 2 MG tablet   Oral   Take 2 mg by mouth daily as needed (migraine pain).          . triamcinolone cream (KENALOG) 0.1 %   Topical   Apply 1 application topically daily as needed (for skin irritation).         . ALPRAZolam (XANAX) 0.25 MG tablet   Oral   Take 2 tablets (0.5 mg total) by mouth at bedtime as needed for sleep.   15 tablet   0     BP 129/68  Pulse 70  Temp(Src) 97.7 F (36.5 C) (Oral)  Resp 18  Ht 5\' 5"  (1.651 m)  SpO2 97%  Physical Exam  Nursing note and vitals reviewed. Constitutional: She is oriented to person, place, and time. She appears well-developed and well-nourished. No distress.  HENT:  Head: Normocephalic and atraumatic.  Eyes: Pupils are equal, round, and reactive to light.  Neck: Normal range of motion.  Cardiovascular: Normal rate and intact distal pulses.   Pulmonary/Chest: No respiratory distress. She has no wheezes. She has no rales.  Abdominal: Normal appearance. She exhibits no distension.  Musculoskeletal: Normal range of  motion. She exhibits edema (One to 2+ pedal).  Neurological: She is alert and oriented to person, place, and time. No cranial nerve deficit.  Skin: Skin is warm and dry. No rash noted.  Psychiatric: She has a normal mood and affect. Her behavior is normal.    ED Course  Procedures (including critical care time)  Labs Reviewed  CBC - Abnormal; Notable for the following:    MCHC 36.6 (*)    All other components within normal limits  BASIC METABOLIC PANEL - Abnormal; Notable for the following:    Sodium 133 (*)    Glucose, Bld 164 (*)    GFR calc non Af Amer 79 (*)    All other components within normal limits  PRO B NATRIURETIC PEPTIDE  POCT I-STAT TROPONIN I   Dg Chest 2 View  11/22/2012  *RADIOLOGY REPORT*  Clinical Data: Short of breath for 4 days.  Chest pain.  CHEST - 2 VIEW  Comparison: 11/18/2012.  Findings:  Cardiopericardial silhouette within normal limits. Mediastinal contours normal. Trachea midline.  No airspace disease or effusion.  Tortuous thoracic aorta.  Compared to prior exams, the cardiomediastinal contours are unchanged.  Right costophrenic angle excluded from view on the frontal.  IMPRESSION: No acute cardiopulmonary disease or interval change.   Original Report Authenticated By: Andreas Newport, M.D.    Ct Angio Chest Pe W/cm &/or Wo Cm  11/22/2012  *RADIOLOGY REPORT*  Clinical Data: Shortness of breath.  CT ANGIOGRAPHY CHEST  Technique:  Multidetector CT imaging of the chest using the standard protocol during bolus administration of intravenous contrast. Multiplanar reconstructed images including MIPs were obtained and reviewed to evaluate the vascular anatomy.  Contrast: OMNIPAQUE IOHEXOL 350 MG/ML SOLN  Comparison: None.  Findings: No filling defects in the pulmonary arteries to suggest pulmonary emboli. Heart is normal size. Aorta is normal caliber. No mediastinal, hilar, or axillary adenopathy.  Visualized thyroid and chest wall soft tissues unremarkable. Lungs  are clear.  No focal airspace opacities or suspicious nodules.  No effusions. Imaging into the upper abdomen shows no acute findings.  Degenerative changes throughout the thoracic spine.  No acute bony abnormality.  IMPRESSION: No acute cardiopulmonary disease.  No evidence of pulmonary embolus.   Original Report Authenticated By: Charlett Nose, M.D.      1. Shortness of breath       MDM  Pt ambulated on room air with no hypoxia and no discomfort.  After treatment in the ED the patient feels back to baseline and wants to go home.       Nelia Shi, MD 11/23/12 2044

## 2012-11-22 NOTE — ED Notes (Signed)
Pt ambulated in the hallway with O2 at 100%. No distress noted while walking. Skin warm and dry. EDP informed.

## 2012-11-26 ENCOUNTER — Encounter: Payer: Self-pay | Admitting: *Deleted

## 2012-11-27 ENCOUNTER — Ambulatory Visit (INDEPENDENT_AMBULATORY_CARE_PROVIDER_SITE_OTHER): Payer: Medicare Other | Admitting: Family Medicine

## 2012-11-27 ENCOUNTER — Encounter: Payer: Self-pay | Admitting: Family Medicine

## 2012-11-27 VITALS — BP 152/90 | Temp 97.9°F | Wt 194.8 lb

## 2012-11-27 DIAGNOSIS — R509 Fever, unspecified: Secondary | ICD-10-CM

## 2012-11-27 DIAGNOSIS — R5381 Other malaise: Secondary | ICD-10-CM

## 2012-11-27 NOTE — Progress Notes (Signed)
  Subjective:    Patient ID: [name], female    DOB: 16-Jun-1930, 79 y.o.   MRN: [id number]  HPIthis patient was seen because of concerns from the family. She's not been getting along well at home. She's had 2 ER visits in the past 2 weeks. One time she did have atrial fib with rapid ventricular response. She denies any current chest pain or shortness of breath but she does relate that she just doesn't feel good. She was also in the ER with for what appeared to be anxiety related issues. He uses Xanax at night to help her sleep. They want the daughter wonders if she can go back on amitriptyline. In addition to this was using Neurontin but really didn't seem to be helping so therefore they stopped it. In addition to this patient easily gets confused forgets the names of things and seems to be getting worse with independent function. She does seem to make simple foods for herself and take a shower but as far as complex activities at home or significant cleaning she does not do that. Currently there is a family member who looks on in on her for a few hours per week plus also another family member, [name], who goes by every evening to make sure her medications were right. She sleeps by her self at night her husband has died she lives in a home without supervision. Patient states she feels cold today but has not been running any fever no sweats no wheezing or difficulty breathing no vomiting diarrhea or hematuria. Has had some urinary tract infections another member of the family set her up with a urologist because they are concerned about the possibility of bladder cancer.  Patient has a history of uric UTIs, dementia , overactive bladder, hypertension, varicose veins, asthma  Review of Systems Please see above    Objective:   Physical Exam Vital signs stable. Heart rate is normal. Not in atrial fib currently. Pulses are stable. Skin warm dry abdomen is soft no guarding or rebound extremities no edema  lungs are clear neck no masses throat is normal ears are normal  Labs were ordered.     Assessment & Plan:  #1 dementia -patient is dealing with Alzheimer's is on medication twice per day I believe she has gotten to the pointwhere she should not be staying by herself at night. I recommended that the family consider placement if they do not have family members who can stay with her 24 7. In addition to this I have cautioned against starting back amitriptyline because the risk of falls. They will use Xanax at night because she does have significant anxieties that are unrelieved by behavioral techniques. I did warn the daughter that there is a risk of falls associated with his Xanax. #2 we will do some lab work to rule out secondary illness await the results of this #3-apparently they have an appointment set up with urologist. If they would like to keep this appointment I believe it is fine otherwise I would recommend more conservative approach given her Alzheimer's. I recommended that they pursue placement in a rest home facility. They're looking into this.  25-30 minutes spent with patient. #4-atrial fib-resolved currently.  Patient should followup with [place] in 3-4 weeks. May need echo.

## 2012-11-28 LAB — BASIC METABOLIC PANEL
Calcium: 9.7 mg/dL (ref 8.4–10.5)
Creat: 0.94 mg/dL (ref 0.50–1.10)

## 2012-11-28 LAB — CBC WITH DIFFERENTIAL/PLATELET
Basophils Absolute: 0 10*3/uL (ref 0.0–0.1)
Eosinophils Absolute: 0.1 10*3/uL (ref 0.0–0.7)
Eosinophils Relative: 1 % (ref 0–5)
MCH: 29.8 pg (ref 26.0–34.0)
MCV: 87.8 fL (ref 78.0–100.0)
Platelets: 151 10*3/uL (ref 150–400)
RDW: 13.7 % (ref 11.5–15.5)

## 2012-12-02 ENCOUNTER — Telehealth: Payer: Self-pay | Admitting: *Deleted

## 2012-12-02 ENCOUNTER — Telehealth: Payer: Self-pay | Admitting: Family Medicine

## 2012-12-02 ENCOUNTER — Other Ambulatory Visit: Payer: Self-pay | Admitting: Family Medicine

## 2012-12-02 ENCOUNTER — Other Ambulatory Visit: Payer: Self-pay | Admitting: *Deleted

## 2012-12-02 ENCOUNTER — Encounter: Payer: Self-pay | Admitting: *Deleted

## 2012-12-02 DIAGNOSIS — R0600 Dyspnea, unspecified: Secondary | ICD-10-CM

## 2012-12-02 DIAGNOSIS — I4891 Unspecified atrial fibrillation: Secondary | ICD-10-CM

## 2012-12-02 NOTE — Progress Notes (Signed)
Discussed with grand daughter.

## 2012-12-02 NOTE — Telephone Encounter (Signed)
Family member Warden/ranger)  called to ask permission to give pt xanax this am, had received 2 at [facility] for increase anxiety.  Melina Schools states pt is still anxious and tearful this am, pt to get half of xanax (0.25) for anxiety this am for agitation and tearfulness per Dr. Lorin Picket.

## 2012-12-02 NOTE — Telephone Encounter (Signed)
Daughter called stated xanax was changed from taking 2 qhs to taking 1/2 during the day and 2 qhs. She wanted to make sure it got changed on medlist. I did not see in chart where order was changed is this ok to change on medlist

## 2012-12-02 NOTE — Telephone Encounter (Signed)
Directions changed on med list 

## 2012-12-02 NOTE — Telephone Encounter (Signed)
pts mom is going to be admitted to [facility]. Pt will need a TB test done and other things that [name] would like to talk to you about her nursing home stay. Please call her in the next 30 mins ... [phone] after 9:15 am  Use the other number attached from now till 9:15. Thanks

## 2012-12-02 NOTE — Telephone Encounter (Signed)
I would recommend changing it to go as follows. Xanax 0.25 mg half tablet every 6 hours as needed for anxiousness may use one or 2 tablets at nighttime as necessary to help with sleep.

## 2012-12-02 NOTE — Telephone Encounter (Signed)
ECHO scheduled at [facility] 12-05-12. At 2pm arrive 1:45. Pt's granddaughter notified of appt.

## 2012-12-03 ENCOUNTER — Other Ambulatory Visit: Payer: Self-pay

## 2012-12-05 ENCOUNTER — Ambulatory Visit (HOSPITAL_COMMUNITY)
Admission: RE | Admit: 2012-12-05 | Discharge: 2012-12-05 | Disposition: A | Discharge status: Home / Self Care | Payer: Medicare Other | Source: Ambulatory Visit | Attending: Family Medicine | Admitting: Family Medicine

## 2012-12-05 DIAGNOSIS — I10 Essential (primary) hypertension: Secondary | ICD-10-CM | POA: Insufficient documentation

## 2012-12-05 DIAGNOSIS — R0600 Dyspnea, unspecified: Secondary | ICD-10-CM

## 2012-12-05 DIAGNOSIS — I517 Cardiomegaly: Secondary | ICD-10-CM

## 2012-12-05 DIAGNOSIS — I4891 Unspecified atrial fibrillation: Secondary | ICD-10-CM | POA: Insufficient documentation

## 2012-12-05 DIAGNOSIS — E119 Type 2 diabetes mellitus without complications: Secondary | ICD-10-CM | POA: Insufficient documentation

## 2012-12-05 DIAGNOSIS — R011 Cardiac murmur, unspecified: Secondary | ICD-10-CM | POA: Insufficient documentation

## 2012-12-05 NOTE — Progress Notes (Signed)
*  PRELIMINARY RESULTS* Echocardiogram 2D Echocardiogram has been performed.  Conrad Chesaning 12/05/2012, 2:24 PM

## 2012-12-08 ENCOUNTER — Telehealth: Payer: Self-pay | Admitting: Family Medicine

## 2012-12-08 NOTE — Telephone Encounter (Signed)
Call her tomm after 4 pm at the number below.

## 2012-12-08 NOTE — Telephone Encounter (Signed)
Inform robin i will call on Tuesday i will be busy with patients i've seen today and charting until past 9

## 2012-12-08 NOTE — Telephone Encounter (Signed)
pts daughter [name], would like the results of her Echo as well as to speak with you regarding her stay at the Nursing home. [name] states she is not doing well at the rest home and would like to know what your opinion is on the subject. You can also call her at [phone]

## 2012-12-16 ENCOUNTER — Encounter: Payer: Self-pay | Admitting: Family Medicine

## 2012-12-16 ENCOUNTER — Ambulatory Visit (INDEPENDENT_AMBULATORY_CARE_PROVIDER_SITE_OTHER): Payer: Medicare Other | Admitting: Family Medicine

## 2012-12-16 VITALS — BP 90/66 | Wt 188.4 lb

## 2012-12-16 DIAGNOSIS — F068 Other specified mental disorders due to known physiological condition: Secondary | ICD-10-CM

## 2012-12-16 DIAGNOSIS — I10 Essential (primary) hypertension: Secondary | ICD-10-CM

## 2012-12-16 DIAGNOSIS — F329 Major depressive disorder, single episode, unspecified: Secondary | ICD-10-CM

## 2012-12-16 DIAGNOSIS — E039 Hypothyroidism, unspecified: Secondary | ICD-10-CM

## 2012-12-16 DIAGNOSIS — G309 Alzheimer's disease, unspecified: Secondary | ICD-10-CM

## 2012-12-16 DIAGNOSIS — F411 Generalized anxiety disorder: Secondary | ICD-10-CM

## 2012-12-16 DIAGNOSIS — F0280 Dementia in other diseases classified elsewhere without behavioral disturbance: Secondary | ICD-10-CM

## 2012-12-16 MED ORDER — AMITRIPTYLINE HCL 25 MG PO TABS: ORAL_TABLET | ORAL | Status: DC

## 2012-12-16 MED ORDER — ALPRAZOLAM 0.25 MG PO TABS: ORAL_TABLET | ORAL | Status: DC

## 2012-12-16 NOTE — Progress Notes (Signed)
  Subjective:    Patient ID: [name], female    DOB: 1930-07-06, 79 y.o.   MRN: [id number]  HPI Patient is currently staying at her rest home there has been some difficulties with him memory and at times getting agitated and nervous recently and the medicine was switched from Xanax to Klonopin because they did not feel the Xanax was doing and. Nail the caregiver relates that Klonopin causes too much sedation. Also has significant troubles with the Alzheimer's as well I talked with family at length about what to expect with that. Past medical history Alzheimer's anxiety  Review of Systems Please see above patient denies any discomfort currently no chest pain shortness breath vomiting diarrhea bloody stools. No rash or fever.    Objective:   Physical Exam  Patient is laying down. Lungs are clear heart is regular pulse normal abdomen is soft blood pressure was taken laying and sitting in was approximately 110/60 extremities no edema      Assessment & Plan:  #1 migraine has infrequently uses Zanaflex for this #2-Alzheimer's-continue Namenda. I don't really feel like adding other medication to it I don't think it's necessarily help we will continue to monitor her weight. #3 anxiety issues apparently Klonopin is causing too much drowsiness according to the rest home assistant who is with her today. Stop Klonopin. We will use low-dose Xanax during the day every 6 hours as needed we'll also try amitriptyline again at nighttime this might help her sleep as well as migraines. She's been on this for years and we have stopped that a few months ago but the benefits outweigh the risks. See patient back in about 6 weeks. I do not feel other testing is indicated currently.

## 2012-12-23 ENCOUNTER — Other Ambulatory Visit: Payer: Self-pay | Admitting: Family Medicine

## 2012-12-28 ENCOUNTER — Encounter (HOSPITAL_COMMUNITY): Payer: Self-pay | Admitting: Emergency Medicine

## 2012-12-28 ENCOUNTER — Emergency Department (HOSPITAL_COMMUNITY): Payer: Medicare Other

## 2012-12-28 ENCOUNTER — Emergency Department (HOSPITAL_COMMUNITY)
Admission: EM | Admit: 2012-12-28 | Discharge: 2012-12-28 | Disposition: A | Discharge status: Home / Self Care | Payer: Medicare Other | Attending: Emergency Medicine | Admitting: Emergency Medicine

## 2012-12-28 DIAGNOSIS — Z8669 Personal history of other diseases of the nervous system and sense organs: Secondary | ICD-10-CM | POA: Insufficient documentation

## 2012-12-28 DIAGNOSIS — R3915 Urgency of urination: Secondary | ICD-10-CM | POA: Insufficient documentation

## 2012-12-28 DIAGNOSIS — G43909 Migraine, unspecified, not intractable, without status migrainosus: Secondary | ICD-10-CM | POA: Insufficient documentation

## 2012-12-28 DIAGNOSIS — Z85820 Personal history of malignant melanoma of skin: Secondary | ICD-10-CM | POA: Insufficient documentation

## 2012-12-28 DIAGNOSIS — F329 Major depressive disorder, single episode, unspecified: Secondary | ICD-10-CM | POA: Insufficient documentation

## 2012-12-28 DIAGNOSIS — F0280 Dementia in other diseases classified elsewhere without behavioral disturbance: Secondary | ICD-10-CM | POA: Insufficient documentation

## 2012-12-28 DIAGNOSIS — F3289 Other specified depressive episodes: Secondary | ICD-10-CM | POA: Insufficient documentation

## 2012-12-28 DIAGNOSIS — M109 Gout, unspecified: Secondary | ICD-10-CM | POA: Insufficient documentation

## 2012-12-28 DIAGNOSIS — E785 Hyperlipidemia, unspecified: Secondary | ICD-10-CM | POA: Insufficient documentation

## 2012-12-28 DIAGNOSIS — E079 Disorder of thyroid, unspecified: Secondary | ICD-10-CM | POA: Insufficient documentation

## 2012-12-28 DIAGNOSIS — Z79899 Other long term (current) drug therapy: Secondary | ICD-10-CM | POA: Insufficient documentation

## 2012-12-28 DIAGNOSIS — N390 Urinary tract infection, site not specified: Secondary | ICD-10-CM | POA: Insufficient documentation

## 2012-12-28 DIAGNOSIS — Z8679 Personal history of other diseases of the circulatory system: Secondary | ICD-10-CM | POA: Insufficient documentation

## 2012-12-28 DIAGNOSIS — E039 Hypothyroidism, unspecified: Secondary | ICD-10-CM | POA: Insufficient documentation

## 2012-12-28 DIAGNOSIS — Z862 Personal history of diseases of the blood and blood-forming organs and certain disorders involving the immune mechanism: Secondary | ICD-10-CM | POA: Insufficient documentation

## 2012-12-28 DIAGNOSIS — Z8639 Personal history of other endocrine, nutritional and metabolic disease: Secondary | ICD-10-CM | POA: Insufficient documentation

## 2012-12-28 DIAGNOSIS — Z8551 Personal history of malignant neoplasm of bladder: Secondary | ICD-10-CM | POA: Insufficient documentation

## 2012-12-28 DIAGNOSIS — R350 Frequency of micturition: Secondary | ICD-10-CM | POA: Insufficient documentation

## 2012-12-28 DIAGNOSIS — G309 Alzheimer's disease, unspecified: Secondary | ICD-10-CM | POA: Insufficient documentation

## 2012-12-28 DIAGNOSIS — Z8739 Personal history of other diseases of the musculoskeletal system and connective tissue: Secondary | ICD-10-CM | POA: Insufficient documentation

## 2012-12-28 DIAGNOSIS — I10 Essential (primary) hypertension: Secondary | ICD-10-CM | POA: Insufficient documentation

## 2012-12-28 DIAGNOSIS — J209 Acute bronchitis, unspecified: Secondary | ICD-10-CM | POA: Insufficient documentation

## 2012-12-28 LAB — URINE MICROSCOPIC-ADD ON

## 2012-12-28 LAB — URINALYSIS, ROUTINE W REFLEX MICROSCOPIC
Bilirubin Urine: NEGATIVE
Nitrite: POSITIVE — AB
Specific Gravity, Urine: <1.005 — ABNORMAL LOW (ref 1.005–1.030)
Urobilinogen, UA: 0.2 mg/dL (ref 0.0–1.0)

## 2012-12-28 MED ORDER — ALBUTEROL SULFATE (5 MG/ML) 0.5% IN NEBU
2.5000 mg | INHALATION_SOLUTION | Freq: Once | RESPIRATORY_TRACT | Status: AC
  Administered 2012-12-28: 2.5 mg via RESPIRATORY_TRACT
  Filled 2012-12-28: qty 0.5

## 2012-12-28 MED ORDER — HYDROCODONE-ACETAMINOPHEN 5-325 MG PO TABS: 1.0000 | ORAL_TABLET | ORAL | Status: DC | PRN

## 2012-12-28 MED ORDER — CEPHALEXIN 500 MG PO CAPS: 500.0000 mg | ORAL_CAPSULE | Freq: Three times a day (TID) | ORAL | Status: DC

## 2012-12-28 MED ORDER — LEVOFLOXACIN 500 MG PO TABS
500.0000 mg | ORAL_TABLET | Freq: Once | ORAL | Status: AC
  Administered 2012-12-28: 500 mg via ORAL
  Filled 2012-12-28: qty 1

## 2012-12-28 MED ORDER — IPRATROPIUM BROMIDE 0.02 % IN SOLN
0.5000 mg | Freq: Once | RESPIRATORY_TRACT | Status: AC
  Administered 2012-12-28: 0.5 mg via RESPIRATORY_TRACT
  Filled 2012-12-28: qty 2.5

## 2012-12-28 MED ORDER — ALBUTEROL SULFATE HFA 108 (90 BASE) MCG/ACT IN AERS
2.0000 | INHALATION_SPRAY | RESPIRATORY_TRACT | Status: DC | PRN
  Administered 2012-12-28: 2 via RESPIRATORY_TRACT
  Filled 2012-12-28: qty 6.7

## 2012-12-28 NOTE — ED Notes (Signed)
Pt stays in [facility] care. Cough started x 2 days ago. Prod cough with white sputum. C/o chronic lower back and L leg pain at present.

## 2012-12-28 NOTE — ED Provider Notes (Signed)
History    This chart was scribed for Cindy Booze, MD by Leone Payor, ED Scribe. This patient was seen in room [facility] and the patient's care was started 12:46 PM.   CSN: [id number]  Arrival date & time 12/28/12  1043   None     Chief Complaint  Patient presents with  . Cough     The history is provided by the patient. No language interpreter was used.    HPI Comments: [name] is a 79 y.o. female who presents to the Emergency Department complaining of at least 2 days of constant, gradual onset, gradually worsening cough that is productive of white sputum. She has associated urinary frequency, urinary urgency. Pt is a resident at [facility] home care. She denies SOB, fever, chills, sweats, nausea, vomiting, spitting up blood, dysuria. Pt denies smoking and alcohol use.    Past Medical History  Diagnosis Date  . Thyroid disease   . Hypertension   . Gout   . Dementia   . Vertigo   . Low back pain   . Hypothyroidism   . Arthritis   . Hyperlipidemia   . Hemorrhoids with complication   . Cancer 2012    Melanoma-Face Removed at [facility]  . Bladder cancer     saw dr. Earlene Plater had surgery had follow about 3 years ago  . Atrial fibrillation   . Migraine   . Depression   . Hyperglycemia   . Alzheimer disease     Past Surgical History  Procedure Laterality Date  . Cholecystectomy    . Colonoscopy  11/21/09    Simple adenoma/pancolonic diverticulosis(next on due in 5/10 years)  . Eye surgery      Bilateral Cataract Surgery  . Flexible sigmoidoscopy  12/03/2011    Procedure: FLEXIBLE SIGMOIDOSCOPY;  Surgeon: West Bali, MD;  Location: [facility];  Service: Endoscopy;  Laterality: N/A;  12:00  . Bladder surgery      remove cancer  . Melanoma excision      on face    Family History  Problem Relation Age of Onset  . Migraines Mother   . Arthritis Father   . Diabetes Father   . Cancer Brother   . Diabetes Brother   . Breast cancer Daughter   .  Diabetes Brother     History  Substance Use Topics  . Smoking status: Never Smoker   . Smokeless tobacco: Never Used  . Alcohol Use: No    OB History   Grav Para Term Preterm Abortions TAB SAB Ect Mult Living                  Review of Systems  Constitutional: Negative for fever, chills and diaphoresis.  Respiratory: Positive for cough. Negative for shortness of breath.   Gastrointestinal: Negative for nausea and vomiting.  Genitourinary: Positive for urgency and frequency. Negative for dysuria.  All other systems reviewed and are negative.    Allergies  Sulfonamide derivatives  Home Medications   Current Outpatient Rx  Name  Route  Sig  Dispense  Refill  . acetaminophen (TYLENOL) 500 MG tablet   Oral   Take 500 mg by mouth every 6 (six) hours as needed for pain.         Marland Kitchen allopurinol (ZYLOPRIM) 100 MG tablet   Oral   Take 100 mg by mouth daily.         Marland Kitchen ALPRAZolam (XANAX) 0.25 MG tablet   Oral  Take 0.25 mg by mouth at bedtime as needed for anxiety.         Marland Kitchen amitriptyline (ELAVIL) 50 MG tablet   Oral   Take 50 mg by mouth at bedtime.         Marland Kitchen atorvastatin (LIPITOR) 20 MG tablet   Oral   Take 20 mg by mouth at bedtime.         . citalopram (CELEXA) 10 MG tablet   Oral   Take 10 mg by mouth daily.         . Lactobacillus (ACIDOPHILUS PO)   Oral   Take 1 capsule by mouth daily.         Marland Kitchen levothyroxine (SYNTHROID, LEVOTHROID) 100 MCG tablet   Oral   Take 100 mcg by mouth daily before breakfast.         . losartan-hydrochlorothiazide (HYZAAR) 100-12.5 MG per tablet   Oral   Take 1 tablet by mouth daily.         . memantine (NAMENDA) 10 MG tablet   Oral   Take 10 mg by mouth 2 (two) times daily.         Marland Kitchen tiZANidine (ZANAFLEX) 2 MG tablet   Oral   Take 2 mg by mouth every 8 (eight) hours as needed (migraine pain).            BP 145/75  Pulse 65  Temp(Src) 98 F (36.7 C) (Oral)  Resp 20  Ht 5\' 4"  (1.626 m)  Wt 185  lb (83.915 kg)  BMI 31.74 kg/m2  SpO2 98%  Physical Exam  Nursing note and vitals reviewed. Constitutional: She is oriented to person, place, and time. She appears well-developed and well-nourished. No distress.  HENT:  Head: Normocephalic and atraumatic.  Eyes: EOM are normal.  Neck: Neck supple. No tracheal deviation present.  Cardiovascular: Normal rate, regular rhythm and normal heart sounds.   Pulmonary/Chest: Effort normal. No respiratory distress. She has wheezes.  Prolonged exhalation phase. Mild wheezing with forced exhalation.     Musculoskeletal: Normal range of motion.  Neurological: She is alert and oriented to person, place, and time.  Skin: Skin is warm and dry.  Psychiatric: She has a normal mood and affect. Her behavior is normal.    ED Course  Procedures (including critical care time)  DIAGNOSTIC STUDIES: Oxygen Saturation is 98% on room air, normal by my interpretation.    COORDINATION OF CARE: 12:58 PM Discussed treatment plan with pt at bedside and pt agreed to plan.   Results for orders placed during the hospital encounter of 12/28/12  URINALYSIS, ROUTINE W REFLEX MICROSCOPIC      Result Value Range   Color, Urine YELLOW  YELLOW   APPearance HAZY (*) CLEAR   Specific Gravity, Urine <1.005 (*) 1.005 - 1.030   pH 6.5  5.0 - 8.0   Glucose, UA NEGATIVE  NEGATIVE mg/dL   Hgb urine dipstick TRACE (*) NEGATIVE   Bilirubin Urine NEGATIVE  NEGATIVE   Ketones, ur NEGATIVE  NEGATIVE mg/dL   Protein, ur NEGATIVE  NEGATIVE mg/dL   Urobilinogen, UA 0.2  0.0 - 1.0 mg/dL   Nitrite POSITIVE (*) NEGATIVE   Leukocytes, UA MODERATE (*) NEGATIVE  URINE MICROSCOPIC-ADD ON      Result Value Range   Squamous Epithelial / LPF FEW (*) RARE   WBC, UA TOO NUMEROUS TO COUNT  <3 WBC/hpf   RBC / HPF 7-10  <3 RBC/hpf   Bacteria, UA MANY (*) RARE  Dg Chest 2 View  12/28/2012   *RADIOLOGY REPORT*  Clinical Data: Cough, chest congestion and weakness.  CHEST - 2 VIEW   Comparison: 11/22/2012.  Findings: Stable normal sized heart.  Clear lungs with normal vascularity.  Minimal bilateral pleural fluid or thickening. Thoracic spine degenerative changes.  IMPRESSION: Minimal bilateral pleural fluid or thickening.  Otherwise, no acute abnormality.   Original Report Authenticated By: Beckie Salts, M.D.      1. Acute bronchitis   2. Urinary tract infection       MDM  Acute bronchitis. Probable urinary tract infection. Chest x-ray shows no evidence of pneumonia and urinalysis confirms presence of urinary tract infection. She was given an initial dose of levofloxacin but when the prescription was written, warning of interaction between levofloxacin and citalopram was noted so a prescription was changed to cephalexin. She was given an albuterol and nebulizer treatment with significant improvement so she is given an albuterol inhaler to use at home. Prescription is also given for acetaminophen-hydrocodone to use as needed for cough suppression. She is to followup with her PCP. Daughter is concerned about her ongoing leg pain which had improved on gabapentin and does not seem to be controlled as well on amitriptyline. I've asked her to discuss that with her PCP.    I personally performed the services described in this documentation, which was scribed in my presence. The recorded information has been reviewed and is accurate.     Cindy Booze, MD 12/28/12 1414

## 2012-12-30 ENCOUNTER — Telehealth: Payer: Self-pay | Admitting: Family Medicine

## 2012-12-30 NOTE — Telephone Encounter (Signed)
Pt POA aware of directions for Ventolin Inhaler

## 2012-12-30 NOTE — Telephone Encounter (Signed)
Patient was seen in ER on Dec 28, 2012.  Ms. Mayford Knife states they were given Ventil for Ms. Stimmel, however were not given instructions on how to given this medication.  Ms. Mayford Knife states the facility will need a physician's order to administer this medication to patient.    Please contact Ms. Judy Pimple as soon as possible.  May fax order also (Fax number is same as the phone number) Thanks

## 2012-12-30 NOTE — Telephone Encounter (Signed)
Ventolin 2 puff q4 hours prn f/u if ongoing

## 2012-12-31 LAB — URINE CULTURE: Colony Count: >=100000

## 2012-12-31 NOTE — ED Notes (Signed)
+   Urine culture treated with Cephalexin-sensitive to same. Ok per [employer]

## 2013-01-14 ENCOUNTER — Telehealth: Payer: Self-pay | Admitting: Family Medicine

## 2013-01-14 NOTE — Telephone Encounter (Signed)
Rx refill request faxed back to pharmacy. Amitriptyline increased to 75 mg daily per Dr. Lorin Picket. [name] was notified.

## 2013-01-14 NOTE — Telephone Encounter (Signed)
[name] wants to talk with [name] regarding [name] medication.  Wants to know if they can increase one of her medications.  Please call [name].  Thanks

## 2013-01-19 ENCOUNTER — Ambulatory Visit: Payer: Medicare Other | Admitting: Family Medicine

## 2013-01-20 ENCOUNTER — Other Ambulatory Visit: Payer: Self-pay | Admitting: *Deleted

## 2013-01-20 MED ORDER — AMITRIPTYLINE HCL 75 MG PO TABS: ORAL_TABLET | ORAL | Status: DC

## 2013-01-22 ENCOUNTER — Encounter: Payer: Self-pay | Admitting: Family Medicine

## 2013-01-22 ENCOUNTER — Ambulatory Visit (INDEPENDENT_AMBULATORY_CARE_PROVIDER_SITE_OTHER): Payer: Medicare Other | Admitting: Family Medicine

## 2013-01-22 VITALS — BP 130/82 | HR 70 | Ht 63.5 in | Wt 181.1 lb

## 2013-01-22 DIAGNOSIS — I10 Essential (primary) hypertension: Secondary | ICD-10-CM

## 2013-01-22 DIAGNOSIS — E039 Hypothyroidism, unspecified: Secondary | ICD-10-CM

## 2013-01-22 DIAGNOSIS — D649 Anemia, unspecified: Secondary | ICD-10-CM

## 2013-01-22 DIAGNOSIS — E785 Hyperlipidemia, unspecified: Secondary | ICD-10-CM

## 2013-01-22 DIAGNOSIS — F411 Generalized anxiety disorder: Secondary | ICD-10-CM

## 2013-01-22 DIAGNOSIS — E119 Type 2 diabetes mellitus without complications: Secondary | ICD-10-CM

## 2013-01-22 DIAGNOSIS — N390 Urinary tract infection, site not specified: Secondary | ICD-10-CM

## 2013-01-22 LAB — POCT URINALYSIS DIPSTICK
Spec Grav, UA: <=1.005
pH, UA: 7

## 2013-01-22 NOTE — Progress Notes (Signed)
Subjective:    Patient ID: [name], female    DOB: 1929-08-27, 79 y.o.   MRN: [id number]  Anxiety Patient reports no chest pain. Symptoms occur occasionally. The severity of symptoms is mild. The quality of sleep is good. Nighttime awakenings: occasional.   (UTI) Compliance with medications is 76-100%.  Abdominal Pain This is a recurrent problem. The current episode started in the past 7 days. The onset quality is gradual. The problem occurs intermittently. The problem has been unchanged. The pain is located in the generalized abdominal region. The pain is at a severity of 0/10. The patient is experiencing no pain. The quality of the pain is dull. The abdominal pain does not radiate. Associated symptoms include frequency. Pertinent negatives include no headaches or vomiting. Nothing aggravates the pain. The pain is relieved by nothing. She has tried nothing for the symptoms. The treatment provided no relief. UTI  concerned about sugar. Tries to do a good job of watching sugars in the diet she is at her assisted living. She has dementia yeah so the lady that runs the home [name] is here with her they do a excellent job with her they represent what's going on very well. She does tend to urinate a lot according to the caretaker although she does drink a lot of fluids. It does raise into question how well she is doing with control of the diabetes. We will need to do some testing  Her anxiety symptoms are such to wear they're having to give Xanax in the morning and in the mid afternoon this lady has had anxiety issues for years she does not adjust well to change in the dementia has made it worse the medication does not cause drowsiness nor does it make her sedated. This was talked at length with the caretaker they her on the same page.  At times complains of feeling a funny feeling in the lower abdomen with the urination in so they did a urine culture was positive we treated for infection  caretaker wonders if the patient needs to see urology I believe at this point in time patient is just having frequent urinary tract infections and some of these could be bacteremia if fevers occur or bloody urine or other severe symptoms need to be seen right away if frequent symptomatic urinary tract infections or hospitalizations we will refer to urology  Dementia yeah stable she has Alzheimer's it seems to be going okay with medication.    Review of Systems  Constitutional: Negative for activity change, appetite change and fatigue.  HENT: Negative for congestion, rhinorrhea, neck pain and ear discharge.   Eyes: Negative for discharge.  Respiratory: Negative for cough, chest tightness and wheezing.   Cardiovascular: Negative for chest pain.  Gastrointestinal: Positive for abdominal pain. Negative for vomiting.  Endocrine: Positive for polydipsia and polyuria.  Genitourinary: Positive for frequency. Negative for difficulty urinating.  Allergic/Immunologic: Negative for environmental allergies and food allergies.  Neurological: Positive for weakness. Negative for headaches.  Psychiatric/Behavioral: Negative for behavioral problems and agitation.       Objective:   Physical Exam  Nursing note and vitals reviewed. Constitutional: She is oriented to person, place, and time. She appears well-developed and well-nourished.  HENT:  Head: Normocephalic.  Neck: Normal range of motion. No thyromegaly present.  Cardiovascular: Normal rate, regular rhythm, normal heart sounds and intact distal pulses.   No murmur heard. Pulmonary/Chest: Effort normal and breath sounds normal. No respiratory distress. She has no wheezes.  Abdominal: Soft. Bowel sounds are normal. She exhibits no distension and no mass. There is no tenderness.  Musculoskeletal: Normal range of motion. She exhibits no edema and no tenderness.  Lymphadenopathy:    She has no cervical adenopathy.  Neurological: She is alert and  oriented to person, place, and time. She exhibits normal muscle tone.  Skin: Skin is warm and dry.  Psychiatric: She has a normal mood and affect. Her behavior is normal.          Assessment & Plan:  Anxiety- 1 in am,1@2pm , and 1 in evening if needed. Obviously the goal is not to sedate her but I feel that this is just a manifestation of a lifelong state of anxiety. I believe the rest home is doing everything appropriately to make her feel settled they are. Diabetes-we will get hemoglobin A1c and urine micro-protein Hypertension good control Alzheimer's no significant progression continue current measures Frequent urinary tract infections watches possible referral to urology tests pending

## 2013-01-29 LAB — BASIC METABOLIC PANEL
Calcium: 9.5 mg/dL (ref 8.4–10.5)
Glucose, Bld: 145 mg/dL — ABNORMAL HIGH (ref 70–99)
Potassium: 3.9 mEq/L (ref 3.5–5.3)
Sodium: 137 mEq/L (ref 135–145)

## 2013-01-29 LAB — CBC WITH DIFFERENTIAL/PLATELET
Basophils Absolute: 0 10*3/uL (ref 0.0–0.1)
HCT: 37.7 % (ref 36.0–46.0)
Lymphocytes Relative: 29 % (ref 12–46)
Lymphs Abs: 1.7 10*3/uL (ref 0.7–4.0)
MCV: 87.5 fL (ref 78.0–100.0)
Monocytes Absolute: 0.4 10*3/uL (ref 0.1–1.0)
Neutro Abs: 3.7 10*3/uL (ref 1.7–7.7)
Platelets: 176 10*3/uL (ref 150–400)
RBC: 4.31 MIL/uL (ref 3.87–5.11)
RDW: 14.2 % (ref 11.5–15.5)
WBC: 5.8 10*3/uL (ref 4.0–10.5)

## 2013-01-29 LAB — LIPID PANEL
HDL: 47 mg/dL (ref >39)
Total CHOL/HDL Ratio: 2.8 Ratio
VLDL: 21 mg/dL (ref 0–40)

## 2013-01-29 LAB — HEMOGLOBIN A1C
Hgb A1c MFr Bld: 6.4 % — ABNORMAL HIGH (ref <5.7)
Mean Plasma Glucose: 137 mg/dL — ABNORMAL HIGH (ref <117)

## 2013-01-29 LAB — HEPATIC FUNCTION PANEL
Alkaline Phosphatase: 62 U/L (ref 39–117)
Bilirubin, Direct: 0.2 mg/dL (ref 0.0–0.3)
Indirect Bilirubin: 0.6 mg/dL (ref 0.0–0.9)

## 2013-01-30 LAB — MICROALBUMIN, URINE: Microalb, Ur: 0.52 mg/dL (ref 0.00–1.89)

## 2013-01-30 LAB — URINALYSIS
Bilirubin Urine: NEGATIVE
Glucose, UA: NEGATIVE mg/dL
Specific Gravity, Urine: 1.005 (ref 1.005–1.030)
pH: 6.5 (ref 5.0–8.0)

## 2013-01-31 LAB — URINE CULTURE

## 2013-02-04 ENCOUNTER — Telehealth: Payer: Self-pay | Admitting: Family Medicine

## 2013-02-04 NOTE — Telephone Encounter (Signed)
Office visit scheduled.

## 2013-02-04 NOTE — Telephone Encounter (Signed)
Daughter(POA) says that patient is hurting in her kidneys and she wants to know if pt is supposed to have another urine check from her last visit.

## 2013-02-05 ENCOUNTER — Encounter: Payer: Self-pay | Admitting: Family Medicine

## 2013-02-05 ENCOUNTER — Ambulatory Visit (INDEPENDENT_AMBULATORY_CARE_PROVIDER_SITE_OTHER): Payer: Medicare Other | Admitting: Family Medicine

## 2013-02-05 VITALS — BP 118/78 | Temp 97.8°F | Wt 182.0 lb

## 2013-02-05 DIAGNOSIS — F068 Other specified mental disorders due to known physiological condition: Secondary | ICD-10-CM

## 2013-02-05 DIAGNOSIS — I10 Essential (primary) hypertension: Secondary | ICD-10-CM

## 2013-02-05 DIAGNOSIS — N390 Urinary tract infection, site not specified: Secondary | ICD-10-CM

## 2013-02-05 DIAGNOSIS — E785 Hyperlipidemia, unspecified: Secondary | ICD-10-CM

## 2013-02-05 DIAGNOSIS — E119 Type 2 diabetes mellitus without complications: Secondary | ICD-10-CM

## 2013-02-05 MED ORDER — NITROFURANTOIN MONOHYD MACRO 100 MG PO CAPS: 100.0000 mg | ORAL_CAPSULE | Freq: Two times a day (BID) | ORAL | Status: DC

## 2013-02-08 NOTE — Progress Notes (Signed)
  Subjective:    Patient ID: [name], female    DOB: Mar 13, 1930, 79 y.o.   MRN: [id number]  HPI Patient arrives to office with a different child than the last visit. Please see prior notes. Patient's son has multiple questions about the last visit.  Patient still has some burning with urination. Comes and goes. No fever no chills no back pain.   Review of Systems ROS otherwise negative.    Objective:   Physical Exam  Alert no acute distress. HEENT normal. Lungs clear. Heart regular in rhythm. No CVA tenderness. Slight lobe domino tenderness. No rebound no guarding.  Urine culture results reviewed.      Assessment & Plan:  Impression 1 UTI discussed. #2 hypertension decent control. #3 diabetes discussed. #4 dementia discussed. Plan Macrobid 100 twice a day 7 days. Multiple questions answered and discussed. 25 minutes spent most in discussion. Followup in several months as scheduled. WSL

## 2013-02-23 ENCOUNTER — Other Ambulatory Visit: Payer: Self-pay | Admitting: Family Medicine

## 2013-02-23 NOTE — Telephone Encounter (Signed)
Ok 4 total

## 2013-03-16 ENCOUNTER — Encounter: Payer: Self-pay | Admitting: Family Medicine

## 2013-03-16 ENCOUNTER — Telehealth: Payer: Self-pay | Admitting: Family Medicine

## 2013-03-16 ENCOUNTER — Ambulatory Visit (INDEPENDENT_AMBULATORY_CARE_PROVIDER_SITE_OTHER): Payer: Medicare Other | Admitting: Family Medicine

## 2013-03-16 VITALS — BP 128/86 | Ht 63.5 in | Wt 178.0 lb

## 2013-03-16 DIAGNOSIS — N390 Urinary tract infection, site not specified: Secondary | ICD-10-CM

## 2013-03-16 DIAGNOSIS — R300 Dysuria: Secondary | ICD-10-CM

## 2013-03-16 LAB — POCT URINALYSIS DIPSTICK: Spec Grav, UA: <=1.005

## 2013-03-16 MED ORDER — CEFPROZIL 500 MG PO TABS: 500.0000 mg | ORAL_TABLET | Freq: Two times a day (BID) | ORAL | Status: DC

## 2013-03-16 NOTE — Telephone Encounter (Signed)
pts daughter calling stating that the pt has a real red vaginal area, they are using desitin to try an keep it cool. Wants to know if she should go to the Gynecologist or come here to be seen? Or can we call something in? She is also having burning while urinating and appears to be in pain. Please advise

## 2013-03-16 NOTE — Telephone Encounter (Signed)
If no answer on other line plase call cell phone [phone]

## 2013-03-16 NOTE — Telephone Encounter (Signed)
Schedule appointment upfront for patient. Daughter was notified.

## 2013-03-16 NOTE — Progress Notes (Signed)
  Subjective:    Patient ID: [name], female    DOB: [birth year], 79 y.o.   MRN: [id number]  Dysuria  This is a new problem. The current episode started in the past 7 days. Associated symptoms include frequency.   Patient has had burning while urinating, odor, and vaginal itching   Review of Systems  Genitourinary: Positive for dysuria and frequency.       Objective:   Physical Exam        Assessment & Plan:

## 2013-03-16 NOTE — Progress Notes (Signed)
  Subjective:    Patient ID: [name], female    DOB: 1930-02-15, 79 y.o.   MRN: [id number]  HPI Patient with dysuria vaginal itching dryness not feeling good symptoms present over the past couple weeks. No high fever no vomiting or diarrhea. Worse over the past few days family was concerned. No other particular troubles. In addition to this very stressed about where she is currently. She does not like the leg he runs the place. In addition to this she does not have much planned activity and she suffers with anxiety and depression.  Patient does suffer with moderate Alzheimer's cannot live by her self Daughter comes in today, [name], she is concerned about the situation. Long discussion was held with the patient as well as her daughter about checking out other living facilities and also looking in detail at the possibility of qualifying for Medicaid. 30 minutes was spent with the family today.   Review of Systems No fevers vomiting or diarrhea patient does suffer with frequent UTIs    Objective:   Physical Exam Lungs clear no crackles Heart is regular no murmurs Abdomen is soft no guarding or rebound Genital and-some excoriations on the external aspect I see no sign of any discharge wet prep was negative Urinalysis with wbc's       Assessment & Plan:  #1 UTI-antibiotics prescribed followup accordingly. Recheck patient in one month #2 Alzheimer's continue current medication. #3 difficult living situation significant time spent discussing options. 30 minutes spent with family overall of these issues [id number] greater than half was in discussion

## 2013-03-18 ENCOUNTER — Telehealth: Payer: Self-pay | Admitting: Adult Health

## 2013-03-18 NOTE — Telephone Encounter (Signed)
Said machine is off, will try later

## 2013-03-18 NOTE — Telephone Encounter (Signed)
Spoke with [name], Aidah's daughter. She wants to talk to [place] only. I told [name] I would send the message to you. JSY

## 2013-03-19 LAB — URINE CULTURE: Colony Count: >=100000

## 2013-03-19 NOTE — Telephone Encounter (Signed)
busy

## 2013-03-20 NOTE — Telephone Encounter (Signed)
Mom is at [facility] now and has had UTI and robin had some questions and they were answered.

## 2013-03-24 ENCOUNTER — Other Ambulatory Visit: Payer: Self-pay | Admitting: Family Medicine

## 2013-03-24 NOTE — Telephone Encounter (Signed)
She may have 10 refills on all noncontrolled medication. She may have 4 refills on the Xanax. Thank you

## 2013-04-13 ENCOUNTER — Other Ambulatory Visit: Payer: Self-pay

## 2013-04-13 MED ORDER — CEFPROZIL 500 MG PO TABS: 500.0000 mg | ORAL_TABLET | Freq: Two times a day (BID) | ORAL | Status: DC

## 2013-04-17 ENCOUNTER — Telehealth: Payer: Self-pay | Admitting: Family Medicine

## 2013-04-17 NOTE — Telephone Encounter (Signed)
Wants someone to call her regarding her mother's UTI.  States she does understand why her mother continously has UTI's, states there have been 5 this year.  She is very concerned and wants to know why she is continuously given antibiotics  Please call Patient. Thanks

## 2013-04-17 NOTE — Telephone Encounter (Signed)
States her mom had bladder cancer years ago.  Family is concerned

## 2013-04-17 NOTE — Telephone Encounter (Signed)
Daughter concerned because she has had 5 UTI's this year and has had bladder cancer. Daughter wants to know if you think she needs to see urologist. She saw ronald davis for her bladder cancer. Daughter stated she did not need call back today. Next week is fine.

## 2013-04-20 NOTE — Telephone Encounter (Signed)
Tell robin we will get the ball moving for urology consult

## 2013-04-20 NOTE — Telephone Encounter (Signed)
Family notified

## 2013-04-24 ENCOUNTER — Other Ambulatory Visit: Payer: Self-pay | Admitting: Family Medicine

## 2013-04-24 ENCOUNTER — Telehealth: Payer: Self-pay | Admitting: Family Medicine

## 2013-04-24 ENCOUNTER — Encounter: Payer: Self-pay | Admitting: Family Medicine

## 2013-04-24 ENCOUNTER — Ambulatory Visit (INDEPENDENT_AMBULATORY_CARE_PROVIDER_SITE_OTHER): Payer: Medicare Other | Admitting: Family Medicine

## 2013-04-24 VITALS — BP 132/80 | Temp 98.2°F | Ht 64.0 in | Wt 180.0 lb

## 2013-04-24 DIAGNOSIS — F068 Other specified mental disorders due to known physiological condition: Secondary | ICD-10-CM

## 2013-04-24 DIAGNOSIS — R300 Dysuria: Secondary | ICD-10-CM

## 2013-04-24 DIAGNOSIS — N390 Urinary tract infection, site not specified: Secondary | ICD-10-CM

## 2013-04-24 LAB — POCT URINALYSIS DIPSTICK: pH, UA: 7

## 2013-04-24 NOTE — Telephone Encounter (Signed)
[name] called to check on referral to urology.  It's in last phone message, but has never shown up in my "Q".  Please initiate in system so that I may proceed, states pt needs to see Dr. Earlene Plater (not sure he's still practicing).  Please advise

## 2013-04-24 NOTE — Progress Notes (Signed)
  Subjective:    Patient ID: [name], female    DOB: 05-31-1930, 79 y.o.   MRN: [id number]  HPIHere to recheck urine. Just finished antibiotic and still having some burning with urination.  Patient denies any high fever chills sweats. She denies flank pain she denies vomiting she does relate some dysuria and urinary frequency. She has a history of urinary tract infections.  She also has a history of Alzheimer's plus also history of bladder cancer plus also hypertension hyperlipidemia she is on multiple medicines. Lab work from a few months ago was reviewed with the patient and family today. Patient does not smoke she stays at rest home. Review of Systems See above.    Objective:   Physical Exam Neck no masses lungs are clear no crackles heart is regular pulse normal blood pressure good extremities no edema skin warm dry abdomen some lower pelvic tenderness noted flank nontender  Urinalysis is positive for UTI       Assessment & Plan:  UTI-culture the urine, Cefzil 500 twice a day for 7 days, if high fevers or worse followup HTN stable Alzheimer's stable followup patient approximately 3 months warning signs were discussed.

## 2013-04-24 NOTE — Telephone Encounter (Signed)
Please work with this family, Dr. Earlene Plater is fine but if he is not in practice [facility] or if she wants to be seen at [facility] that is fine

## 2013-04-24 NOTE — Patient Instructions (Signed)
Urinary Tract Infection  Urinary tract infections (UTIs) can develop anywhere along your urinary tract. Your urinary tract is your body's drainage system for removing wastes and extra water. Your urinary tract includes two kidneys, two ureters, a bladder, and a urethra. Your kidneys are a pair of bean-shaped organs. Each kidney is about the size of your fist. They are located below your ribs, one on each side of your spine.  CAUSES  Infections are caused by microbes, which are microscopic organisms, including fungi, viruses, and bacteria. These organisms are so small that they can only be seen through a microscope. Bacteria are the microbes that most commonly cause UTIs.  SYMPTOMS   Symptoms of UTIs may vary by age and gender of the patient and by the location of the infection. Symptoms in young women typically include a frequent and intense urge to urinate and a painful, burning feeling in the bladder or urethra during urination. Older women and men are more likely to be tired, shaky, and weak and have muscle aches and abdominal pain. A fever may mean the infection is in your kidneys. Other symptoms of a kidney infection include pain in your back or sides below the ribs, nausea, and vomiting.  DIAGNOSIS  To diagnose a UTI, your caregiver will ask you about your symptoms. Your caregiver also will ask to provide a urine sample. The urine sample will be tested for bacteria and white blood cells. White blood cells are made by your body to help fight infection.  TREATMENT   Typically, UTIs can be treated with medication. Because most UTIs are caused by a bacterial infection, they usually can be treated with the use of antibiotics. The choice of antibiotic and length of treatment depend on your symptoms and the type of bacteria causing your infection.  HOME CARE INSTRUCTIONS   If you were prescribed antibiotics, take them exactly as your caregiver instructs you. Finish the medication even if you feel better after you  have only taken some of the medication.   Drink enough water and fluids to keep your urine clear or pale yellow.   Avoid caffeine, tea, and carbonated beverages. They tend to irritate your bladder.   Empty your bladder often. Avoid holding urine for long periods of time.   Empty your bladder before and after sexual intercourse.   After a bowel movement, women should cleanse from front to back. Use each tissue only once.  SEEK MEDICAL CARE IF:    You have back pain.   You develop a fever.   Your symptoms do not begin to resolve within 3 days.  SEEK IMMEDIATE MEDICAL CARE IF:    You have severe back pain or lower abdominal pain.   You develop chills.   You have nausea or vomiting.   You have continued burning or discomfort with urination.  MAKE SURE YOU:    Understand these instructions.   Will watch your condition.   Will get help right away if you are not doing well or get worse.  Document Released: 05/02/2005 Document Revised: 01/22/2012 Document Reviewed: 08/31/2011  Exit[employer] Patient Information 2014 [employer].

## 2013-04-26 LAB — URINE CULTURE: Colony Count: >=100000

## 2013-05-04 ENCOUNTER — Telehealth: Payer: Self-pay | Admitting: Family Medicine

## 2013-05-04 NOTE — Telephone Encounter (Signed)
Patient is now seeing Dr Davis at his [place] [facility] on Oct 1st due to her hurting in her bladder. Patients daughter wants to make sure that the information faxed over is now sent to the [place] office °

## 2013-05-04 NOTE — Telephone Encounter (Signed)
Patient is now seeing Dr Earlene Plater at his [facility] on Oct 1st due to her hurting in her bladder. Patients daughter wants to make sure that the information faxed over is now sent to the [facility] office

## 2013-05-04 NOTE — Telephone Encounter (Signed)
Info was faxed 04/27/13

## 2013-05-14 ENCOUNTER — Encounter: Payer: Self-pay | Admitting: Family Medicine

## 2013-05-14 ENCOUNTER — Ambulatory Visit (INDEPENDENT_AMBULATORY_CARE_PROVIDER_SITE_OTHER): Payer: Medicare Other | Admitting: Family Medicine

## 2013-05-14 VITALS — BP 128/82 | Ht 64.0 in | Wt 177.0 lb

## 2013-05-14 DIAGNOSIS — Z23 Encounter for immunization: Secondary | ICD-10-CM

## 2013-05-14 DIAGNOSIS — E119 Type 2 diabetes mellitus without complications: Secondary | ICD-10-CM

## 2013-05-14 DIAGNOSIS — R7309 Other abnormal glucose: Secondary | ICD-10-CM

## 2013-05-14 DIAGNOSIS — R739 Hyperglycemia, unspecified: Secondary | ICD-10-CM

## 2013-05-14 NOTE — Progress Notes (Signed)
  Subjective:    Patient ID: [name], female    DOB: 07-03-1930, 79 y.o.   MRN: [id number]  HPI Patient arrives for a follow up on her hyperglycemia. Also would like ears checked. Saw Urologist Dr Earlene Plater in [place] and he ordered some scans and tests- to follow up with him.   Review of Systems  Constitutional: Negative for fever, chills and fatigue.  HENT: Negative for sinus pressure.   Respiratory: Negative for apnea, cough and choking.   Cardiovascular: Negative for chest pain.  Gastrointestinal: Negative for abdominal pain.       Objective:   Physical Exam  Vitals reviewed. Constitutional: She appears well-developed and well-nourished.  HENT:  Head: Normocephalic.  Right Ear: External ear normal.  Left Ear: External ear normal.  Cardiovascular: Normal rate, regular rhythm and normal heart sounds.   No murmur heard. Pulmonary/Chest: Effort normal and breath sounds normal.  Abdominal: Soft. She exhibits no distension.  Musculoskeletal: She exhibits no edema.  Lymphadenopathy:    She has no cervical adenopathy.    Feet nl      Assessment & Plan:  Hyperglycemia - Plan: POCT glycosylated hemoglobin (Hb A1C)  DIABETES MELLITUS, CONTROLLED  Need for prophylactic vaccination and inoculation against influenza  Dementia stable Urinary followed by urology

## 2013-06-10 ENCOUNTER — Telehealth: Payer: Self-pay

## 2013-06-10 ENCOUNTER — Telehealth: Payer: Self-pay | Admitting: Family Medicine

## 2013-06-10 MED ORDER — CEFPROZIL 500 MG PO TABS: 500.0000 mg | ORAL_TABLET | Freq: Two times a day (BID) | ORAL | Status: DC

## 2013-06-10 NOTE — Telephone Encounter (Signed)
Nurses, I put this on the form and is on the chart. I did this yesterday. Thank you.

## 2013-06-10 NOTE — Telephone Encounter (Addendum)
And Tammy from [facility] has requested that we call her when this is complete. [phone]

## 2013-06-10 NOTE — Telephone Encounter (Signed)
Patient tested positive for a UTI. Please call in Rx for this. The Fax number is [phone]

## 2013-06-10 NOTE — Telephone Encounter (Signed)
Done. Documented in messages in the system.

## 2013-06-10 NOTE — Telephone Encounter (Signed)
Fax from [facility] stated they administered a AZO UTI home test on 06/09/13 on patient and got a positive result. Per Dr. Lorin Picket patient was prescribed Cefzil 500 mg BID x 5 days. Medication was sent to [facility]. Honesti Seaberg (son) was notified medication was sent to pharmacy.

## 2013-06-10 NOTE — Telephone Encounter (Signed)
Correction-needs sent to [facility]

## 2013-06-11 ENCOUNTER — Telehealth: Payer: Self-pay

## 2013-06-11 NOTE — Telephone Encounter (Signed)
[name] called about her Mom who is in nursing home, was seen by Dr Gaynelle Arabian had cysto for chronic UTIs and a US showed ?thickened uterus, request records and make appt with Dr Emelda Fear

## 2013-06-22 ENCOUNTER — Other Ambulatory Visit: Payer: Self-pay | Admitting: Family Medicine

## 2013-07-21 ENCOUNTER — Other Ambulatory Visit: Payer: Self-pay | Admitting: Family Medicine

## 2013-07-23 NOTE — Telephone Encounter (Signed)
Ok times 4 

## 2013-08-20 ENCOUNTER — Other Ambulatory Visit: Payer: Self-pay | Admitting: Family Medicine

## 2013-08-24 ENCOUNTER — Other Ambulatory Visit: Payer: Self-pay | Admitting: Family Medicine

## 2013-08-24 NOTE — Telephone Encounter (Signed)
Refill x6, patient needs office visit by the end of March send notification

## 2013-09-08 ENCOUNTER — Encounter: Payer: Self-pay | Admitting: Family Medicine

## 2013-09-08 ENCOUNTER — Ambulatory Visit (INDEPENDENT_AMBULATORY_CARE_PROVIDER_SITE_OTHER): Payer: Medicare Other | Admitting: Family Medicine

## 2013-09-08 VITALS — BP 120/72 | Ht 64.25 in | Wt 180.0 lb

## 2013-09-08 DIAGNOSIS — I10 Essential (primary) hypertension: Secondary | ICD-10-CM

## 2013-09-08 DIAGNOSIS — Z111 Encounter for screening for respiratory tuberculosis: Secondary | ICD-10-CM

## 2013-09-08 DIAGNOSIS — E039 Hypothyroidism, unspecified: Secondary | ICD-10-CM

## 2013-09-08 DIAGNOSIS — G309 Alzheimer's disease, unspecified: Secondary | ICD-10-CM

## 2013-09-08 DIAGNOSIS — E119 Type 2 diabetes mellitus without complications: Secondary | ICD-10-CM

## 2013-09-08 DIAGNOSIS — F0280 Dementia in other diseases classified elsewhere without behavioral disturbance: Secondary | ICD-10-CM | POA: Insufficient documentation

## 2013-09-08 NOTE — Progress Notes (Signed)
   Subjective:    Patient ID: [name], female    DOB: 06/05/1930, 81 y.o.   MRN: [id number]  HPIFollow up office visit. Patient has a multitude of health problems but she is very pleasant she doesn't complain her dementia she has stable she takes her medicine as directed and she has a history of hypertension she tries to eat properly she does get up walk around some she stays at an assisted living her asthma has been stable she has not had any significant bladder issues she does try to be careful with fats in her diet as well as well as taking her thyroid medicine. And she does try to be careful with sugars  Needs 2nd step PPD test. Given today left forearm.   Requesting D/C order on vagisil. She has never used the cream.      Review of Systems  Constitutional: Negative for activity change, appetite change and fatigue.  HENT: Negative for congestion, ear discharge and rhinorrhea.   Eyes: Negative for discharge.  Respiratory: Negative for cough, chest tightness and wheezing.   Cardiovascular: Negative for chest pain.  Gastrointestinal: Negative for vomiting and abdominal pain.  Genitourinary: Negative for frequency and difficulty urinating.  Musculoskeletal: Negative for neck pain.  Allergic/Immunologic: Negative for environmental allergies and food allergies.  Neurological: Negative for weakness and headaches.  Psychiatric/Behavioral: Negative for behavioral problems and agitation.       Objective:   Physical Exam  Constitutional: She appears well-developed and well-nourished.  HENT:  Head: Normocephalic.  Right Ear: External ear normal.  Left Ear: External ear normal.  Eyes: Pupils are equal, round, and reactive to light.  Neck: Normal range of motion. No thyromegaly present.  Cardiovascular: Normal rate, regular rhythm, normal heart sounds and intact distal pulses.   No murmur heard. Pulmonary/Chest: Effort normal and breath sounds normal. No respiratory distress. She has  no wheezes.  Abdominal: Soft. Bowel sounds are normal. She exhibits no distension and no mass. There is no tenderness.  Musculoskeletal: Normal range of motion. She exhibits no edema and no tenderness.  Lymphadenopathy:    She has no cervical adenopathy.  Neurological: She is alert. She exhibits normal muscle tone.  Skin: Skin is warm and dry.  Psychiatric: She has a normal mood and affect. Her behavior is normal.          Assessment & Plan:  #1 hypothyroidism continue current medication. Will need complete lab work in the summer. #2 diabetes reportedly doing well with diet continue current measures #3 Alzheimer's-continue Namenda. This is helpful #4 hyperlipidemia continue statin this is helpful complete lab work in the summer  #5 gout-no attacks continue current medication check lab work in the summer Lab work and followup office visit in June Will need hemoglobin A1c, lipid, liver, TSH, metastases 7, uric acid level.some is

## 2013-09-23 ENCOUNTER — Telehealth: Payer: Self-pay | Admitting: Family Medicine

## 2013-09-23 NOTE — Telephone Encounter (Signed)
Discussed with Pam at [facility]. They will call back if patient develops symptoms

## 2013-09-23 NOTE — Telephone Encounter (Signed)
Prophylactic use of Tamiflu is not recommended with this type of situation. If she starts showing signs of the flu such as low-grade fever along with head congestion cough and Tamiflu can be started at that time. If flu symptoms occur over the next few days please call us right away and discuss it with [place] thank you

## 2013-09-23 NOTE — Telephone Encounter (Signed)
Patient is at [facility]. 2 other residents have been diagnosed with the flu, along with a staff member. She is not showing any symptoms, but wanting to know if we can go ahead and prescribe Tami-flu.   [facility]

## 2013-10-02 ENCOUNTER — Ambulatory Visit (INDEPENDENT_AMBULATORY_CARE_PROVIDER_SITE_OTHER): Payer: Medicare Other | Admitting: Nurse Practitioner

## 2013-10-02 ENCOUNTER — Ambulatory Visit: Payer: Medicare Other | Admitting: Family Medicine

## 2013-10-02 ENCOUNTER — Encounter: Payer: Self-pay | Admitting: Nurse Practitioner

## 2013-10-02 VITALS — BP 140/92 | Temp 96.9°F | Ht 64.5 in | Wt 180.0 lb

## 2013-10-02 DIAGNOSIS — L0390 Cellulitis, unspecified: Secondary | ICD-10-CM

## 2013-10-02 DIAGNOSIS — W540XXA Bitten by dog, initial encounter: Secondary | ICD-10-CM

## 2013-10-02 DIAGNOSIS — L0291 Cutaneous abscess, unspecified: Secondary | ICD-10-CM

## 2013-10-02 DIAGNOSIS — S61459A Open bite of unspecified hand, initial encounter: Secondary | ICD-10-CM

## 2013-10-02 DIAGNOSIS — S61409A Unspecified open wound of unspecified hand, initial encounter: Secondary | ICD-10-CM

## 2013-10-02 MED ORDER — AMOXICILLIN-POT CLAVULANATE 875-125 MG PO TABS: 1.0000 | ORAL_TABLET | Freq: Two times a day (BID) | ORAL | Status: DC

## 2013-10-02 NOTE — Patient Instructions (Signed)
Cellulitis Cellulitis is an infection of the skin and the tissue beneath it. The infected area is usually red and tender. Cellulitis occurs most often in the arms and lower legs.  CAUSES  Cellulitis is caused by bacteria that enter the skin through cracks or cuts in the skin. The most common types of bacteria that cause cellulitis are Staphylococcus and Streptococcus. SYMPTOMS   Redness and warmth.  Swelling.  Tenderness or pain.  Fever. DIAGNOSIS  Your caregiver can usually determine what is wrong based on a physical exam. Blood tests may also be done. TREATMENT  Treatment usually involves taking an antibiotic medicine. HOME CARE INSTRUCTIONS   Take your antibiotics as directed. Finish them even if you start to feel better.  Keep the infected arm or leg elevated to reduce swelling.  Apply a warm cloth to the affected area up to 4 times per day to relieve pain.  Only take over-the-counter or prescription medicines for pain, discomfort, or fever as directed by your caregiver.  Keep all follow-up appointments as directed by your caregiver. SEEK MEDICAL CARE IF:   You notice red streaks coming from the infected area.  Your red area gets larger or turns dark in color.  Your bone or joint underneath the infected area becomes painful after the skin has healed.  Your infection returns in the same area or another area.  You notice a swollen bump in the infected area.  You develop new symptoms. SEEK IMMEDIATE MEDICAL CARE IF:   You have a fever.  You feel very sleepy.  You develop vomiting or diarrhea.  You have a general ill feeling (malaise) with muscle aches and pains. MAKE SURE YOU:   Understand these instructions.  Will watch your condition.  Will get help right away if you are not doing well or get worse. Document Released: 05/02/2005 Document Revised: 01/22/2012 Document Reviewed: 10/08/2011 [employer] Patient Information 2014 [employer].  

## 2013-10-04 ENCOUNTER — Encounter: Payer: Self-pay | Admitting: Nurse Practitioner

## 2013-10-04 NOTE — Progress Notes (Signed)
Subjective:  Presents with complaints of swelling and tenderness in the left hand that began over the past 48 hours. Had a superficial bite from a dog on the hand about a week ago. Did not have any symptoms until yesterday. No fever. Mildly tender. No other rash. Her son is present with her today.  Objective:   BP 140/92  Temp(Src) 96.9 F (36.1 C) (Axillary)  Ht 5' 4.5" (1.638 m)  Wt 180 lb (81.647 kg)  BMI 30.43 kg/m2 NAD. Alert, although patient has dementia, answers questions appropriately. Lungs clear. Heart regular rate rhythm. A faint line of erythema is noted along the top of the hand at the base of the fingers, no warmth, nontender. Mild localized erythema tenderness and slight warmth noted in a circular area on the palm of the hand towards the lateral edge.  Assessment:Cellulitis - Plan: Td vaccine greater than or equal to 7yo preservative free IM  Dog bite of hand  Plan: Meds ordered this encounter  Medications  . amoxicillin-clavulanate (AUGMENTIN) 875-125 MG per tablet    Sig: Take 1 tablet by mouth 2 (two) times daily.    Dispense:  20 tablet    Refill:  0    Order Specific Question:  Supervising Provider    Answer:  Mikey Kirschner [[id number]]   warning signs discussed with patient and her son. Also written instructions and warning signs given to her residential facility. Call back in 72 hours if no improvement, family to take patient to the local emergency room over the weekend if worse. No record of patient receiving DT in our office, booster given today as a precaution.

## 2013-10-12 LAB — TB SKIN TEST

## 2013-10-24 ENCOUNTER — Emergency Department (HOSPITAL_COMMUNITY)
Admission: EM | Admit: 2013-10-24 | Discharge: 2013-10-24 | Disposition: A | Discharge status: Home / Self Care | Payer: Medicare Other | Attending: Emergency Medicine | Admitting: Emergency Medicine

## 2013-10-24 ENCOUNTER — Emergency Department (HOSPITAL_COMMUNITY): Payer: Medicare Other

## 2013-10-24 ENCOUNTER — Other Ambulatory Visit: Payer: Self-pay

## 2013-10-24 ENCOUNTER — Encounter (HOSPITAL_COMMUNITY): Payer: Self-pay | Admitting: Emergency Medicine

## 2013-10-24 DIAGNOSIS — M109 Gout, unspecified: Secondary | ICD-10-CM | POA: Insufficient documentation

## 2013-10-24 DIAGNOSIS — G309 Alzheimer's disease, unspecified: Secondary | ICD-10-CM | POA: Insufficient documentation

## 2013-10-24 DIAGNOSIS — Z79899 Other long term (current) drug therapy: Secondary | ICD-10-CM | POA: Insufficient documentation

## 2013-10-24 DIAGNOSIS — Z8739 Personal history of other diseases of the musculoskeletal system and connective tissue: Secondary | ICD-10-CM | POA: Insufficient documentation

## 2013-10-24 DIAGNOSIS — F0390 Unspecified dementia without behavioral disturbance: Secondary | ICD-10-CM

## 2013-10-24 DIAGNOSIS — E039 Hypothyroidism, unspecified: Secondary | ICD-10-CM | POA: Insufficient documentation

## 2013-10-24 DIAGNOSIS — I10 Essential (primary) hypertension: Secondary | ICD-10-CM | POA: Insufficient documentation

## 2013-10-24 DIAGNOSIS — Z8551 Personal history of malignant neoplasm of bladder: Secondary | ICD-10-CM | POA: Insufficient documentation

## 2013-10-24 DIAGNOSIS — F329 Major depressive disorder, single episode, unspecified: Secondary | ICD-10-CM | POA: Insufficient documentation

## 2013-10-24 DIAGNOSIS — E785 Hyperlipidemia, unspecified: Secondary | ICD-10-CM | POA: Insufficient documentation

## 2013-10-24 DIAGNOSIS — F0280 Dementia in other diseases classified elsewhere without behavioral disturbance: Secondary | ICD-10-CM | POA: Insufficient documentation

## 2013-10-24 DIAGNOSIS — F3289 Other specified depressive episodes: Secondary | ICD-10-CM | POA: Insufficient documentation

## 2013-10-24 DIAGNOSIS — Z85820 Personal history of malignant melanoma of skin: Secondary | ICD-10-CM | POA: Insufficient documentation

## 2013-10-24 LAB — CBC WITH DIFFERENTIAL/PLATELET
Basophils Absolute: 0 10*3/uL (ref 0.0–0.1)
Basophils Relative: 1 % (ref 0–1)
Eosinophils Absolute: 0.1 10*3/uL (ref 0.0–0.7)
Eosinophils Relative: 1 % (ref 0–5)
HEMATOCRIT: 39.3 % (ref 36.0–46.0)
Hemoglobin: 13.5 g/dL (ref 12.0–15.0)
LYMPHS PCT: 34 % (ref 12–46)
Lymphs Abs: 2.1 10*3/uL (ref 0.7–4.0)
MCH: 30.4 pg (ref 26.0–34.0)
MCHC: 34.4 g/dL (ref 30.0–36.0)
MCV: 88.5 fL (ref 78.0–100.0)
MONO ABS: 0.4 10*3/uL (ref 0.1–1.0)
MONOS PCT: 6 % (ref 3–12)
NEUTROS PCT: 59 % (ref 43–77)
Neutro Abs: 3.7 10*3/uL (ref 1.7–7.7)
Platelets: 176 10*3/uL (ref 150–400)
RBC: 4.44 MIL/uL (ref 3.87–5.11)
RDW: 13.8 % (ref 11.5–15.5)
WBC: 6.3 10*3/uL (ref 4.0–10.5)

## 2013-10-24 LAB — URINALYSIS, ROUTINE W REFLEX MICROSCOPIC
Bilirubin Urine: NEGATIVE
GLUCOSE, UA: NEGATIVE mg/dL
HGB URINE DIPSTICK: NEGATIVE
KETONES UR: NEGATIVE mg/dL
LEUKOCYTES UA: NEGATIVE
Nitrite: NEGATIVE
PH: 6.5 (ref 5.0–8.0)
Protein, ur: NEGATIVE mg/dL
Specific Gravity, Urine: 1.015 (ref 1.005–1.030)
Urobilinogen, UA: 0.2 mg/dL (ref 0.0–1.0)

## 2013-10-24 LAB — BASIC METABOLIC PANEL
BUN: 13 mg/dL (ref 6–23)
CO2: 32 meq/L (ref 19–32)
CREATININE: 0.92 mg/dL (ref 0.50–1.10)
Calcium: 9.3 mg/dL (ref 8.4–10.5)
Chloride: 97 mEq/L (ref 96–112)
GFR calc non Af Amer: 57 mL/min — ABNORMAL LOW (ref >90)
GFR, EST AFRICAN AMERICAN: 66 mL/min — AB (ref >90)
Glucose, Bld: 106 mg/dL — ABNORMAL HIGH (ref 70–99)
Potassium: 4.1 mEq/L (ref 3.7–5.3)
Sodium: 138 mEq/L (ref 137–147)

## 2013-10-24 MED ORDER — LEVOFLOXACIN 500 MG PO TABS: ORAL_TABLET | ORAL | Status: DC

## 2013-10-24 MED ORDER — SODIUM CHLORIDE 0.9 % IV BOLUS (SEPSIS)
500.0000 mL | Freq: Once | INTRAVENOUS | Status: AC
  Administered 2013-10-24: 500 mL via INTRAVENOUS

## 2013-10-24 NOTE — Discharge Instructions (Signed)
Pt needs to follow up with her md next week.   Take levaquin 500 mg tabs,  One pill a day

## 2013-10-24 NOTE — ED Notes (Signed)
Pt presents to er with family for further evaluation of uti, pt is a resident of [facility], family was told that staff there had done a "strip" test on pt's urine this am and it came back positive for uti, family reports that pt has had increased confusion this week, pt denies any pain, is able to answer questions,

## 2013-10-24 NOTE — ED Provider Notes (Signed)
CSN: [id number]     Arrival date & time 10/24/13  1210 History  This chart was scribed for Maudry Diego, MD by Roxan Diesel, ED scribe.  This patient was seen in room APA04/APA04 and the patient's care was started at 1:49 PM.   Chief Complaint  Patient presents with  . Urinary Tract Infection    Patient is a 80 y.o. female presenting with urinary tract infection. The history is provided by the patient. History limited by: dementia. No language interpreter was used.  Urinary Tract Infection This is a new problem. The current episode started more than 2 days ago. The problem has been gradually worsening. Associated symptoms comments: Confusion. She has tried nothing for the symptoms.    Level 5 Caveat: Dementia  HPI Comments: [name] is a 80 y.o. female who presents to the Emergency Department for further evaluation of a likely UTI.  Pt is a resident of an assisted living facility.  Today her daughter was called by staff and told that she has seemed confused for the past week, and this morning a UA revealed a likely UTI.  Daughter called her PCP and was advised to bring her to the ED.  Pt states she is feeling "pretty good" and denies pain to any area.  She denies vomiting.  Pt has Alzheimer disease but daughter states that pt seems slightly worse than her baseline mental status currently.  Pt has h/o bladder cancer but a cystoscopy several months ago was normal.   Past Medical History  Diagnosis Date  . Thyroid disease   . Hypertension   . Gout   . Dementia   . Vertigo   . Low back pain   . Hypothyroidism   . Arthritis   . Hyperlipidemia   . Hemorrhoids with complication   . Cancer 2012    Melanoma-Face Removed at [facility]  . Bladder cancer     saw dr. Rosana Hoes had surgery had follow about 3 years ago  . Atrial fibrillation   . Migraine   . Depression   . Hyperglycemia   . Alzheimer disease     Past Surgical History  Procedure Laterality Date  .  Cholecystectomy    . Colonoscopy  11/21/09    Simple adenoma/pancolonic diverticulosis(next on due in 5/10 years)  . Eye surgery      Bilateral Cataract Surgery  . Flexible sigmoidoscopy  12/03/2011    Procedure: FLEXIBLE SIGMOIDOSCOPY;  Surgeon: Danie Binder, MD;  [facility];  Service: Endoscopy;  Laterality: N/A;  12:00  . Bladder surgery      remove cancer  . Melanoma excision      on face    Family History  Problem Relation Age of Onset  . Migraines Mother   . Arthritis Father   . Diabetes Father   . Cancer Brother   . Diabetes Brother   . Breast cancer Daughter   . Diabetes Brother     History  Substance Use Topics  . Smoking status: Never Smoker   . Smokeless tobacco: Never Used  . Alcohol Use: No    OB History   Grav Para Term Preterm Abortions TAB SAB Ect Mult Living                   Review of Systems  Unable to perform ROS: Dementia      Allergies  Sulfonamide derivatives  Home Medications   Current Outpatient Rx  Name  Route  Sig  Dispense  Refill  . allopurinol (ZYLOPRIM) 100 MG tablet      TAKE ONE TABLET BY MOUTH ONCE DAILY.   30 tablet   9   . ALPRAZolam (XANAX) 0.25 MG tablet      TAKE 1 TABLET BY MOUTH TWICE DAILY AT 8AM AND 2PM.   60 tablet   5   . amitriptyline (ELAVIL) 75 MG tablet      TAKE (1) TABLET BY MOUTH AT BEDTIME.   30 tablet   5   . atorvastatin (LIPITOR) 20 MG tablet      TAKE (1) TABLET BY MOUTH AT BEDTIME.   30 tablet   9   . citalopram (CELEXA) 10 MG tablet      TAKE ONE TABLET BY MOUTH ONCE DAILY.   30 tablet   9   . Lactobacillus (ACIDOPHILUS PO)   Oral   Take 1 capsule by mouth daily.         . Lactobacillus (ACIDOPHILUS) TABS      TAKE ONE TABLET BY MOUTH ONCE DAILY.   30 each   5   . levothyroxine (SYNTHROID, LEVOTHROID) 100 MCG tablet      TAKE ONE TABLET BY MOUTH ONCE DAILY.   30 tablet   9   . losartan-hydrochlorothiazide (HYZAAR) 100-12.5 MG per tablet      TAKE  ONE TABLET BY MOUTH ONCE DAILY.   30 tablet   9   . Multiple Vitamins-Minerals (PRESERVISION/LUTEIN PO)   Oral   Take 1 tablet by mouth 2 (two) times daily.          Marland Kitchen NAMENDA 10 MG tablet      TAKE (1) TABLET BY MOUTH TWICE DAILY.   60 tablet   9   . acetaminophen (TYLENOL) 500 MG tablet   Oral   Take 500 mg by mouth every 6 (six) hours as needed for pain.         Marland Kitchen nystatin-triamcinolone (MYCOLOG II) cream   Topical   Apply 1 application topically 2 (two) times daily as needed (rash).          . triamcinolone cream (KENALOG) 0.1 %   Topical   Apply 1 application topically daily as needed (itching).           BP 138/82  Pulse 79  Temp(Src) 97.5 F (36.4 C) (Oral)  Resp 16  SpO2 100%  Physical Exam  Nursing note and vitals reviewed. Constitutional: She appears well-developed.  HENT:  Head: Normocephalic.  Mouth/Throat: Mucous membranes are dry.  MMs dry  Eyes: Conjunctivae and EOM are normal. No scleral icterus.  Neck: Neck supple. No thyromegaly present.  Cardiovascular: Normal rate and regular rhythm.  Exam reveals no gallop and no friction rub.   No murmur heard. Pulmonary/Chest: No stridor. She has no wheezes. She has no rales. She exhibits no tenderness.  Abdominal: She exhibits no distension. There is no tenderness. There is no rebound.  Musculoskeletal: Normal range of motion. She exhibits no edema.  Lymphadenopathy:    She has no cervical adenopathy.  Neurological: She exhibits normal muscle tone. Coordination normal.  Oriented to person and place only  Skin: No rash noted. No erythema.  Psychiatric: She has a normal mood and affect. Her behavior is normal.    ED Course  Procedures (including critical care time)  DIAGNOSTIC STUDIES: Oxygen Saturation is 100% on room air, normal by my interpretation.    COORDINATION OF CARE: 1:53 PM-Discussed treatment plan which includes  UA with pt and family at bedside and they agreed to plan.      Labs Review Labs Reviewed  BASIC METABOLIC PANEL - Abnormal; Notable for the following:    Glucose, Bld 106 (*)    GFR calc non Af Amer 57 (*)    GFR calc Af Amer 66 (*)    All other components within normal limits  URINALYSIS, ROUTINE W REFLEX MICROSCOPIC  CBC WITH DIFFERENTIAL    Imaging Review Dg Chest 1 View  10/24/2013   CLINICAL DATA:  Weakness, UTI  EXAM: CHEST - 1 VIEW  COMPARISON:  12/28/2012  FINDINGS: Lungs are clear.  No pleural effusion or pneumothorax.  Cardiomegaly.  Degenerative changes of the lower thoracic spine.  IMPRESSION: No evidence of acute cardiopulmonary disease.   Electronically Signed   By: Julian Hy M.D.   On: 10/24/2013 15:19   Ct Head Wo Contrast  10/24/2013   CLINICAL DATA:  Syncope.  EXAM: CT HEAD WITHOUT CONTRAST  TECHNIQUE: Contiguous axial images were obtained from the base of the skull through the vertex without intravenous contrast.  COMPARISON:  CT scan of April 03, 2011.  FINDINGS: Bony calvarium appears intact. Mild diffuse cortical atrophy is noted. Mild chronic ischemic white matter disease is noted. No mass effect or midline shift is noted. Ventricular size is within normal limits. There is no evidence of mass lesion, hemorrhage or acute infarction.  IMPRESSION: Mild diffuse cortical atrophy. Mild chronic ischemic white matter disease. No acute intracranial abnormality seen.   Electronically Signed   By: Sabino Dick M.D.   On: 10/24/2013 15:22     EKG Interpretation None      MDM  Altered mental status with hx of urine test suggesting uti.  Will cover with levaquin and have pt follow up with pcp Final diagnoses:  None   The chart was scribed for me under my direct supervision.  I personally performed the history, physical, and medical decision making and all procedures in the evaluation of this patient.Maudry Diego, MD 10/24/13 1600

## 2013-10-27 ENCOUNTER — Telehealth: Payer: Self-pay | Admitting: Family Medicine

## 2013-10-27 NOTE — Telephone Encounter (Signed)
Patients daughter says that this past weekend she had to go to the ER and they did a urine culture on her. She wants to know if we can look at that urine culture and let Shirlean Mylar know if patient is on the appropriate antibiotic. She is hoping someone can call her before 5pm today. I told her I would include this in the message, but can't promise she will get a call back before then.

## 2013-10-27 NOTE — Telephone Encounter (Signed)
Notified Shirlean Mylar that urine culture results are not in the system from the ER. Shirlean Mylar stated that she will contact the ER to see they sent her urine off for culture or not.

## 2013-10-28 ENCOUNTER — Telehealth: Payer: Self-pay | Admitting: *Deleted

## 2013-10-28 DIAGNOSIS — E782 Mixed hyperlipidemia: Secondary | ICD-10-CM

## 2013-10-28 DIAGNOSIS — R5381 Other malaise: Secondary | ICD-10-CM

## 2013-10-28 DIAGNOSIS — Z79899 Other long term (current) drug therapy: Secondary | ICD-10-CM

## 2013-10-28 DIAGNOSIS — R5383 Other fatigue: Principal | ICD-10-CM

## 2013-10-28 NOTE — Telephone Encounter (Signed)
[name] called wants to get bloodwork orders for her appt. Was taken to ER for UTI. Patient is confused.Did Ct scan of head in hospital.  Cindy Gala states hospital told robin she did not have UTI but put her on levaquin. [phone].

## 2013-10-28 NOTE — Telephone Encounter (Signed)
bloodwork orders ready. [name] notified. Pt needs to be fasting and she was transferred to front to scheduled office visit with Dr. Nicki Reaper.

## 2013-10-28 NOTE — Telephone Encounter (Signed)
This patient is due for multiple labs. I recommend lipid, liver, metabolic 7, CBC, TSH. Diagnosis-diuretic use, hyperlipidemia, hypothyroidism, fatigue. Recommended followup office visit after labs

## 2013-11-02 ENCOUNTER — Telehealth: Payer: Self-pay | Admitting: Family Medicine

## 2013-11-02 NOTE — Telephone Encounter (Signed)
Notified that we do not have results currently and will call when results are back and the doctor has reviewed them.

## 2013-11-02 NOTE — Telephone Encounter (Signed)
Care home called wanting results on patient lab work she had done this morning.Care home states patient is fine but having some issues with her memory and they want to make sure nothing else is going on. Call with results to [place].

## 2013-11-03 ENCOUNTER — Telehealth: Payer: Self-pay | Admitting: Family Medicine

## 2013-11-03 LAB — CBC WITH DIFFERENTIAL/PLATELET
BASOS ABS: 0.1 10*3/uL (ref 0.0–0.1)
Basophils Relative: 1 % (ref 0–1)
EOS PCT: 2 % (ref 0–5)
Eosinophils Absolute: 0.1 10*3/uL (ref 0.0–0.7)
HEMATOCRIT: 41.4 % (ref 36.0–46.0)
Hemoglobin: 14.1 g/dL (ref 12.0–15.0)
LYMPHS ABS: 1.8 10*3/uL (ref 0.7–4.0)
Lymphocytes Relative: 33 % (ref 12–46)
MCH: 30.1 pg (ref 26.0–34.0)
MCHC: 34.1 g/dL (ref 30.0–36.0)
MCV: 88.3 fL (ref 78.0–100.0)
MONO ABS: 0.3 10*3/uL (ref 0.1–1.0)
MONOS PCT: 6 % (ref 3–12)
NEUTROS ABS: 3.2 10*3/uL (ref 1.7–7.7)
Neutrophils Relative %: 58 % (ref 43–77)
Platelets: 195 10*3/uL (ref 150–400)
RBC: 4.69 MIL/uL (ref 3.87–5.11)
RDW: 14.1 % (ref 11.5–15.5)
WBC: 5.5 10*3/uL (ref 4.0–10.5)

## 2013-11-03 LAB — LIPID PANEL
CHOLESTEROL: 135 mg/dL (ref 0–200)
HDL: 54 mg/dL (ref >39)
LDL Cholesterol: 54 mg/dL (ref 0–99)
Total CHOL/HDL Ratio: 2.5 Ratio
Triglycerides: 136 mg/dL (ref <150)
VLDL: 27 mg/dL (ref 0–40)

## 2013-11-03 LAB — BASIC METABOLIC PANEL
BUN: 13 mg/dL (ref 6–23)
CO2: 27 mEq/L (ref 19–32)
Calcium: 9.2 mg/dL (ref 8.4–10.5)
Chloride: 95 mEq/L — ABNORMAL LOW (ref 96–112)
Creat: 0.8 mg/dL (ref 0.50–1.10)
GLUCOSE: 145 mg/dL — AB (ref 70–99)
POTASSIUM: 4.3 meq/L (ref 3.5–5.3)
SODIUM: 132 meq/L — AB (ref 135–145)

## 2013-11-03 LAB — HEPATIC FUNCTION PANEL
ALT: 17 U/L (ref 0–35)
AST: 18 U/L (ref 0–37)
Albumin: 3.8 g/dL (ref 3.5–5.2)
Alkaline Phosphatase: 75 U/L (ref 39–117)
BILIRUBIN DIRECT: 0.1 mg/dL (ref 0.0–0.3)
BILIRUBIN TOTAL: 0.6 mg/dL (ref 0.2–1.2)
Indirect Bilirubin: 0.5 mg/dL (ref 0.2–1.2)
Total Protein: 6.8 g/dL (ref 6.0–8.3)

## 2013-11-03 LAB — TSH: TSH: 5.364 u[IU]/mL — ABNORMAL HIGH (ref 0.350–4.500)

## 2013-11-03 MED ORDER — LEVOTHYROXINE SODIUM 112 MCG PO TABS: 112.0000 ug | ORAL_TABLET | Freq: Every day | ORAL | Status: AC

## 2013-11-03 NOTE — Telephone Encounter (Signed)
Family notified

## 2013-11-03 NOTE — Addendum Note (Signed)
Addended by: Dairl Ponder on: 11/03/2013 01:43 PM   Modules accepted: Orders, Medications

## 2013-11-03 NOTE — Telephone Encounter (Signed)
pts family is looking for results to her labs please call as soon as you can

## 2013-11-03 NOTE — Telephone Encounter (Signed)
Labs were drawn yesterday and fell in Dr. Bary Leriche Basket this morning

## 2013-11-03 NOTE — Telephone Encounter (Signed)
Results discussed with daughter. Med sent electronically to pharmacy.  Family and assisted living are very concerned about patient. They state she has a history of dementia but has taken a dramatic sudden decline in mentation. Went to ER urine was normal-CT showed no change -b/w ok was given an antibiotic just in case but has shown no improvement. Patient is withdrawn doesn't recognize people at times -very confused- sudden onset and progressive Patient has a follow up office visit to discuss with Dr Nicki Reaper next week  Family concerned it was so sudden and dramatic.  Family wants to know if this is normal with dementia.

## 2013-11-03 NOTE — Telephone Encounter (Signed)
Unfortunately demential doesn't decline often on a smooth slope but sometimes sudden shifts like this having received the er eval and b w has done the right things for suden intervention next step is f u with dr Nicki Reaper as sched

## 2013-11-10 ENCOUNTER — Encounter: Payer: Self-pay | Admitting: Family Medicine

## 2013-11-10 ENCOUNTER — Ambulatory Visit (INDEPENDENT_AMBULATORY_CARE_PROVIDER_SITE_OTHER): Payer: Medicare Other | Admitting: Family Medicine

## 2013-11-10 VITALS — BP 154/92 | Ht 64.5 in | Wt 173.0 lb

## 2013-11-10 DIAGNOSIS — N318 Other neuromuscular dysfunction of bladder: Secondary | ICD-10-CM

## 2013-11-10 DIAGNOSIS — R410 Disorientation, unspecified: Secondary | ICD-10-CM

## 2013-11-10 DIAGNOSIS — E039 Hypothyroidism, unspecified: Secondary | ICD-10-CM

## 2013-11-10 DIAGNOSIS — G309 Alzheimer's disease, unspecified: Secondary | ICD-10-CM

## 2013-11-10 DIAGNOSIS — F0280 Dementia in other diseases classified elsewhere without behavioral disturbance: Secondary | ICD-10-CM

## 2013-11-10 DIAGNOSIS — F29 Unspecified psychosis not due to a substance or known physiological condition: Secondary | ICD-10-CM

## 2013-11-10 LAB — POCT URINALYSIS DIPSTICK
SPEC GRAV UA: 1.01
pH, UA: 6.5

## 2013-11-10 NOTE — Progress Notes (Signed)
   Subjective:    Patient ID: [name], female    DOB: [birth year], 80 y.o.   MRN: [id number]  HPI Patient is here today b/c son and granddaughter are concerned about confusion/dementia with patient.  They are wondering if it has to do with a sodium deficiency or with a UTI.  3 weeks ago, she seemed absolutely fine. Now she isn't eating or recognizing family members.  A long discussion was held today with the son as well as granddaughter. 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 There is been no unilateral numbness or weakness according to the caretakers.  In addition Mr. it is been no fever no vomiting or diarrhea no reported dysuria. Patient has dementia and cannot give much information. When asked if she is hurting anywhere she says now she denies any fever denies abdominal pain chest pain headaches.   Review of Systems See above, very limited because of patient's dementia    Objective:   Physical Exam Lungs are clear no crackles heart is regular pulses normal blood pressure is slightly elevated but on recheck is good abdomen soft no guarding or rebound extremities no edema urinalysis with occasional WBC       Assessment & Plan:  #1 dementia-continue Namenda 10 mg twice a day in addition to that followup again in a month to see how things are going. We did talk about the natural course of dementia and this could be related to that  #2 hypothyroidism using Synthroid 112 once daily check TSH in 8 weeks  #3 slight hyponatremic I don't feel that this is causing the  patient's symptoms but we will recheck this again in 2 months time avoid over drinking  Slight amount of white blood cells in the urine check urine culture await the results. 25 minutes spent with the

## 2013-11-12 LAB — URINE CULTURE
Colony Count: NO GROWTH
ORGANISM ID, BACTERIA: NO GROWTH

## 2013-11-18 ENCOUNTER — Ambulatory Visit: Payer: Medicare Other | Admitting: Family Medicine

## 2014-09-15 ENCOUNTER — Encounter (HOSPITAL_COMMUNITY): Payer: Self-pay

## 2014-09-15 ENCOUNTER — Inpatient Hospital Stay (HOSPITAL_COMMUNITY)
Admission: EM | Admit: 2014-09-15 | Discharge: 2014-09-21 | DRG: 872 | Disposition: A | Discharge status: Home Health | Payer: Medicare Other | Attending: Pulmonary Disease | Admitting: Pulmonary Disease

## 2014-09-15 DIAGNOSIS — Z8551 Personal history of malignant neoplasm of bladder: Secondary | ICD-10-CM

## 2014-09-15 DIAGNOSIS — Z85820 Personal history of malignant melanoma of skin: Secondary | ICD-10-CM

## 2014-09-15 DIAGNOSIS — N390 Urinary tract infection, site not specified: Secondary | ICD-10-CM | POA: Diagnosis present

## 2014-09-15 DIAGNOSIS — I4891 Unspecified atrial fibrillation: Secondary | ICD-10-CM | POA: Diagnosis present

## 2014-09-15 DIAGNOSIS — F419 Anxiety disorder, unspecified: Secondary | ICD-10-CM | POA: Diagnosis present

## 2014-09-15 DIAGNOSIS — Z833 Family history of diabetes mellitus: Secondary | ICD-10-CM

## 2014-09-15 DIAGNOSIS — K5900 Constipation, unspecified: Secondary | ICD-10-CM | POA: Diagnosis present

## 2014-09-15 DIAGNOSIS — Z66 Do not resuscitate: Secondary | ICD-10-CM | POA: Diagnosis present

## 2014-09-15 DIAGNOSIS — A419 Sepsis, unspecified organism: Secondary | ICD-10-CM | POA: Diagnosis not present

## 2014-09-15 DIAGNOSIS — D696 Thrombocytopenia, unspecified: Secondary | ICD-10-CM | POA: Diagnosis present

## 2014-09-15 DIAGNOSIS — E039 Hypothyroidism, unspecified: Secondary | ICD-10-CM | POA: Diagnosis present

## 2014-09-15 DIAGNOSIS — E785 Hyperlipidemia, unspecified: Secondary | ICD-10-CM | POA: Diagnosis present

## 2014-09-15 DIAGNOSIS — Z803 Family history of malignant neoplasm of breast: Secondary | ICD-10-CM

## 2014-09-15 DIAGNOSIS — E860 Dehydration: Secondary | ICD-10-CM | POA: Diagnosis present

## 2014-09-15 DIAGNOSIS — D649 Anemia, unspecified: Secondary | ICD-10-CM | POA: Diagnosis present

## 2014-09-15 DIAGNOSIS — F0280 Dementia in other diseases classified elsewhere without behavioral disturbance: Secondary | ICD-10-CM | POA: Diagnosis present

## 2014-09-15 DIAGNOSIS — M1990 Unspecified osteoarthritis, unspecified site: Secondary | ICD-10-CM | POA: Diagnosis present

## 2014-09-15 DIAGNOSIS — G309 Alzheimer's disease, unspecified: Secondary | ICD-10-CM | POA: Diagnosis present

## 2014-09-15 DIAGNOSIS — B9620 Unspecified Escherichia coli [E. coli] as the cause of diseases classified elsewhere: Secondary | ICD-10-CM | POA: Diagnosis present

## 2014-09-15 DIAGNOSIS — R52 Pain, unspecified: Secondary | ICD-10-CM

## 2014-09-15 DIAGNOSIS — R509 Fever, unspecified: Secondary | ICD-10-CM | POA: Diagnosis not present

## 2014-09-15 DIAGNOSIS — R7881 Bacteremia: Secondary | ICD-10-CM | POA: Diagnosis present

## 2014-09-15 DIAGNOSIS — E871 Hypo-osmolality and hyponatremia: Secondary | ICD-10-CM | POA: Diagnosis present

## 2014-09-15 DIAGNOSIS — R011 Cardiac murmur, unspecified: Secondary | ICD-10-CM | POA: Diagnosis present

## 2014-09-15 DIAGNOSIS — I10 Essential (primary) hypertension: Secondary | ICD-10-CM | POA: Diagnosis present

## 2014-09-15 LAB — URINE MICROSCOPIC-ADD ON

## 2014-09-15 LAB — CBC WITH DIFFERENTIAL/PLATELET
Basophils Absolute: 0 10*3/uL (ref 0.0–0.1)
Basophils Relative: 0 % (ref 0–1)
EOS ABS: 0 10*3/uL (ref 0.0–0.7)
EOS PCT: 0 % (ref 0–5)
HEMATOCRIT: 34.8 % — AB (ref 36.0–46.0)
HEMOGLOBIN: 12.1 g/dL (ref 12.0–15.0)
LYMPHS ABS: 0.5 10*3/uL — AB (ref 0.7–4.0)
Lymphocytes Relative: 6 % — ABNORMAL LOW (ref 12–46)
MCH: 30.6 pg (ref 26.0–34.0)
MCHC: 34.8 g/dL (ref 30.0–36.0)
MCV: 88.1 fL (ref 78.0–100.0)
MONOS PCT: 6 % (ref 3–12)
Monocytes Absolute: 0.5 10*3/uL (ref 0.1–1.0)
Neutro Abs: 7.7 10*3/uL (ref 1.7–7.7)
Neutrophils Relative %: 88 % — ABNORMAL HIGH (ref 43–77)
Platelets: 127 10*3/uL — ABNORMAL LOW (ref 150–400)
RBC: 3.95 MIL/uL (ref 3.87–5.11)
RDW: 13.9 % (ref 11.5–15.5)
WBC: 8.7 10*3/uL (ref 4.0–10.5)

## 2014-09-15 LAB — COMPREHENSIVE METABOLIC PANEL
ALT: 32 U/L (ref 0–35)
AST: 35 U/L (ref 0–37)
Albumin: 3.5 g/dL (ref 3.5–5.2)
Alkaline Phosphatase: 76 U/L (ref 39–117)
Anion gap: 7 (ref 5–15)
BUN: 16 mg/dL (ref 6–23)
CALCIUM: 8.6 mg/dL (ref 8.4–10.5)
CO2: 25 mmol/L (ref 19–32)
Chloride: 96 mmol/L (ref 96–112)
Creatinine, Ser: 0.92 mg/dL (ref 0.50–1.10)
GFR calc non Af Amer: 56 mL/min — ABNORMAL LOW (ref >90)
GFR, EST AFRICAN AMERICAN: 65 mL/min — AB (ref >90)
GLUCOSE: 188 mg/dL — AB (ref 70–99)
Potassium: 3.5 mmol/L (ref 3.5–5.1)
Sodium: 128 mmol/L — ABNORMAL LOW (ref 135–145)
Total Bilirubin: 1.3 mg/dL — ABNORMAL HIGH (ref 0.3–1.2)
Total Protein: 6.9 g/dL (ref 6.0–8.3)

## 2014-09-15 LAB — I-STAT CG4 LACTIC ACID, ED: LACTIC ACID, VENOUS: 1.1 mmol/L (ref 0.5–2.0)

## 2014-09-15 LAB — URINALYSIS, ROUTINE W REFLEX MICROSCOPIC
BILIRUBIN URINE: NEGATIVE
Glucose, UA: NEGATIVE mg/dL
KETONES UR: 15 mg/dL — AB
Nitrite: POSITIVE — AB
PH: 6.5 (ref 5.0–8.0)
SPECIFIC GRAVITY, URINE: 1.015 (ref 1.005–1.030)
Urobilinogen, UA: 0.2 mg/dL (ref 0.0–1.0)

## 2014-09-15 MED ORDER — SODIUM CHLORIDE 0.9 % IV BOLUS (SEPSIS)
1000.0000 mL | Freq: Once | INTRAVENOUS | Status: AC
  Administered 2014-09-15: 1000 mL via INTRAVENOUS

## 2014-09-15 MED ORDER — DEXTROSE 5 % IV SOLN
1.0000 g | Freq: Once | INTRAVENOUS | Status: AC
  Administered 2014-09-15: 1 g via INTRAVENOUS
  Filled 2014-09-15: qty 10

## 2014-09-15 MED ORDER — ACETAMINOPHEN 650 MG RE SUPP
650.0000 mg | Freq: Once | RECTAL | Status: AC
  Administered 2014-09-15: 650 mg via RECTAL
  Filled 2014-09-15: qty 1

## 2014-09-15 NOTE — ED Provider Notes (Signed)
CSN: [id number]     Arrival date & time 09/15/14  2153 History  This chart was scribed for [employer]. Alvino Chapel, MD by Molli Posey, ED Scribe. This patient was seen in room APA04/APA04 and the patient's care was started 10:11 PM.    Chief Complaint  Patient presents with  . Urinary Tract Infection   LEVEL 5 CAVEAT - Altered Mental Status   HPI HPI Comments: [name] is a 81 y.o. female who presents to the Emergency Department complaining of UTI according to EMS. Pt from assisted living with complaint of UTI and fever. She was dx by PCP with UTI yesterday and group home did not have her prescriptions filled. Pt had a fever of 103 rectally at ED.   Past Medical History  Diagnosis Date  . Thyroid disease   . Hypertension   . Gout   . Dementia   . Vertigo   . Low back pain   . Hypothyroidism   . Arthritis   . Hyperlipidemia   . Hemorrhoids with complication   . Cancer 2012    Melanoma[facility]Face Rem[facility] at [facility]  . Bladder cancer     saw dr. Rosana Hoes had surgery had follow about 3 years ago  . Atrial fibrillation   . Migraine   . Depression   . Hyperglycemia   . Alzheimer disease    Past Surgical History  Procedure Laterality Date  . Cholecystectomy    . Colonoscopy  11/21/09    Simple adenoma/pancolonic diverticulosis(next on due in 5/10 years)  . Eye surgery      Bilateral Cataract Surgery  . Flexible sigmoidoscopy  12/03/2011    Procedure: FLEXIBLE SIGMOIDOSCOPY;  Surgeon: Danie Binder, MD;  Location[facility];  Service: Endoscopy;  Laterality: N/A;  12:00  . Bladder surgery      remove cancer  . Melanoma excision      on face   Family History  Problem Relation Age of Onset  . Migraines Mother   . Arthritis Father   . Diabetes Father   . Cancer Brother   . Diabetes Brother   . Breast cancer Daughter   . Diabetes Brother    History  Substance Use Topics  . Smoking status: Never Smoker   . Smokeless tobacco: Never Used  . Alcohol Use: No    OB History    No data available     Review of Systems  Unable to perform ROS: Mental status change    Allergies  Sulfonamide derivatives  Home Medications   Prior to Admission medications   Medication Sig Start Date End Date Taking? Authorizing Provider  acetaminophen (TYLENOL) 500 MG tablet Take 500 mg by mouth every 6 (six) hours as needed for pain.    Historical Provider, MD  allopurinol (ZYLOPRIM) 100 MG tablet TAKE ONE TABLET BY MOUTH ONCE DAILY. 03/24/13   Kathyrn Drown, MD  ALPRAZolam (XANAX) 0.25 MG tablet TAKE 1 TABLET BY MOUTH TWICE DAILY AT 8AM AND 2PM. 08/24/13   Kathyrn Drown, MD  amitriptyline (ELAVIL) 75 MG tablet TAKE (1) TABLET BY MOUTH AT BEDTIME. 06/22/13   Kathyrn Drown, MD  atorvastatin (LIPITOR) 20 MG tablet TAKE (1) TABLET BY MOUTH AT BEDTIME. 03/24/13   Kathyrn Drown, MD  citalopram (CELEXA) 10 MG tablet TAKE ONE TABLET BY MOUTH ONCE DAILY. 03/24/13   Kathyrn Drown, MD  Lactobacillus (ACIDOPHILUS) TABS TAKE ONE TABLET BY MOUTH ONCE DAILY. 06/22/13   Kathyrn Drown, MD  levothyroxine (SYNTHROID, LEVOTHROID) 112 MCG tablet Take 1 tablet (112 mcg total) by mouth daily. 11/03/13   Kathyrn Drown, MD  losartan-hydrochlorothiazide (HYZAAR) 100-12.5 MG per tablet TAKE ONE TABLET BY MOUTH ONCE DAILY. 03/24/13   Kathyrn Drown, MD  Multiple Vitamins-Minerals (PRESERVISION/LUTEIN PO) Take 1 tablet by mouth 2 (two) times daily.     Historical Provider, MD  NAMENDA 10 MG tablet TAKE (1) TABLET BY MOUTH TWICE DAILY. 03/24/13   Kathyrn Drown, MD  nystatin-triamcinolone (MYCOLOG II) cream Apply 1 application topically 2 (two) times daily as needed (rash).  12/11/12   Historical Provider, MD  triamcinolone cream (KENALOG) 0.1 % Apply 1 application topically daily as needed (itching).  12/05/12   Historical Provider, MD   BP 149/83 mmHg  Pulse 118  Temp(Src) 103 F (39.4 C) (Rectal)  Resp 24  Ht 5\' 6"  (1.676 m)  Wt 180 lb (81.647 kg)  BMI 29.07 kg/m2  SpO2 96% Physical  Exam  Constitutional: She appears well-developed and well-nourished.  Warm. Somewhat confused. Baseline dementia.   HENT:  Head: Normocephalic and atraumatic.  Neck: Neck supple.  Cardiovascular: Normal rate.   Pulmonary/Chest: Effort normal.  Abdominal: She exhibits no distension. There is no CVA tenderness.  Musculoskeletal:  Bilateral lower extremity pitting edema.   Neurological: She is alert.  Patient is demented. Somewhat poor historian. States she just feels bad.  Skin: Skin is warm and dry.  Nursing note and vitals reviewed.   ED Course  Procedures   DIAGNOSTIC STUDIES: Oxygen Saturation is 96% on RA, normal by my interpretation.    COORDINATION OF CARE: 10:15 PM Discussed treatment plan with pt at bedside and pt agreed to plan.   Labs Review Labs Reviewed  CBC WITH DIFFERENTIAL/PLATELET - Abnormal; Notable for the following:    HCT 34.8 (*)    Platelets 127 (*)    Neutrophils Relative % 88 (*)    Lymphocytes Relative 6 (*)    Lymphs Abs 0.5 (*)    All other components within normal limits  COMPREHENSIVE METABOLIC PANEL - Abnormal; Notable for the following:    Sodium 128 (*)    Glucose, Bld 188 (*)    Total Bilirubin 1.3 (*)    GFR calc non Af Amer 56 (*)    GFR calc Af Amer 65 (*)    All other components within normal limits  URINALYSIS, ROUTINE W REFLEX MICROSCOPIC - Abnormal; Notable for the following:    APPearance HAZY (*)    Hgb urine dipstick TRACE (*)    Ketones, ur 15 (*)    Protein, ur TRACE (*)    Nitrite POSITIVE (*)    Leukocytes, UA SMALL (*)    All other components within normal limits  URINE MICROSCOPIC-ADD ON - Abnormal; Notable for the following:    Squamous Epithelial / LPF FEW (*)    Bacteria, UA MANY (*)    All other components within normal limits  CULTURE, BLOOD (ROUTINE X 2)  CULTURE, BLOOD (ROUTINE X 2)  I-STAT CG4 LACTIC ACID, ED    Imaging Review No results found.   EKG Interpretation None      MDM   Final  diagnoses:  None   patient with urinary tract infection and sepsis. Does not appear to be severe sepsis. White count is normal but does have fever up to 103. Reportedly also had recent positive urinary tract infection group home. Will admit to internal medicine.  I personally performed the services described in this  documentation, which was scribed in my presence. The recorded information has been reviewed and is accurate.      Jasper Riling. Alvino Chapel, MD 09/16/14 [phone]

## 2014-09-15 NOTE — ED Notes (Signed)
Patient from assisted living with a complaint of UTI and fever. Patient was diagnosed by PCP with UTI yesterday and group home did not have her prescriptions filled. Patient is a poor historian d/t medical history of alzheimers. Per EMS pts fever was 101.3 orally at group home.

## 2014-09-16 DIAGNOSIS — I4891 Unspecified atrial fibrillation: Secondary | ICD-10-CM | POA: Diagnosis present

## 2014-09-16 DIAGNOSIS — Z833 Family history of diabetes mellitus: Secondary | ICD-10-CM | POA: Diagnosis not present

## 2014-09-16 DIAGNOSIS — E860 Dehydration: Secondary | ICD-10-CM | POA: Diagnosis present

## 2014-09-16 DIAGNOSIS — G309 Alzheimer's disease, unspecified: Secondary | ICD-10-CM

## 2014-09-16 DIAGNOSIS — B9620 Unspecified Escherichia coli [E. coli] as the cause of diseases classified elsewhere: Secondary | ICD-10-CM | POA: Diagnosis present

## 2014-09-16 DIAGNOSIS — I10 Essential (primary) hypertension: Secondary | ICD-10-CM | POA: Diagnosis present

## 2014-09-16 DIAGNOSIS — Z66 Do not resuscitate: Secondary | ICD-10-CM | POA: Diagnosis present

## 2014-09-16 DIAGNOSIS — D696 Thrombocytopenia, unspecified: Secondary | ICD-10-CM | POA: Diagnosis present

## 2014-09-16 DIAGNOSIS — E785 Hyperlipidemia, unspecified: Secondary | ICD-10-CM | POA: Diagnosis present

## 2014-09-16 DIAGNOSIS — M1990 Unspecified osteoarthritis, unspecified site: Secondary | ICD-10-CM | POA: Diagnosis present

## 2014-09-16 DIAGNOSIS — K5900 Constipation, unspecified: Secondary | ICD-10-CM | POA: Diagnosis present

## 2014-09-16 DIAGNOSIS — Z85820 Personal history of malignant melanoma of skin: Secondary | ICD-10-CM | POA: Diagnosis not present

## 2014-09-16 DIAGNOSIS — E039 Hypothyroidism, unspecified: Secondary | ICD-10-CM | POA: Diagnosis present

## 2014-09-16 DIAGNOSIS — N390 Urinary tract infection, site not specified: Secondary | ICD-10-CM | POA: Diagnosis present

## 2014-09-16 DIAGNOSIS — F0280 Dementia in other diseases classified elsewhere without behavioral disturbance: Secondary | ICD-10-CM | POA: Diagnosis present

## 2014-09-16 DIAGNOSIS — Z8551 Personal history of malignant neoplasm of bladder: Secondary | ICD-10-CM | POA: Diagnosis not present

## 2014-09-16 DIAGNOSIS — A419 Sepsis, unspecified organism: Secondary | ICD-10-CM | POA: Diagnosis present

## 2014-09-16 DIAGNOSIS — D649 Anemia, unspecified: Secondary | ICD-10-CM | POA: Diagnosis present

## 2014-09-16 DIAGNOSIS — F419 Anxiety disorder, unspecified: Secondary | ICD-10-CM | POA: Diagnosis present

## 2014-09-16 DIAGNOSIS — E871 Hypo-osmolality and hyponatremia: Secondary | ICD-10-CM | POA: Diagnosis present

## 2014-09-16 DIAGNOSIS — Z803 Family history of malignant neoplasm of breast: Secondary | ICD-10-CM | POA: Diagnosis not present

## 2014-09-16 DIAGNOSIS — R509 Fever, unspecified: Secondary | ICD-10-CM | POA: Diagnosis present

## 2014-09-16 LAB — COMPREHENSIVE METABOLIC PANEL
ALBUMIN: 2.8 g/dL — AB (ref 3.5–5.2)
ALT: 39 U/L — AB (ref 0–35)
AST: 40 U/L — AB (ref 0–37)
Alkaline Phosphatase: 65 U/L (ref 39–117)
Anion gap: 5 (ref 5–15)
BUN: 14 mg/dL (ref 6–23)
CALCIUM: 7.8 mg/dL — AB (ref 8.4–10.5)
CHLORIDE: 104 mmol/L (ref 96–112)
CO2: 22 mmol/L (ref 19–32)
Creatinine, Ser: 0.84 mg/dL (ref 0.50–1.10)
GFR calc Af Amer: 73 mL/min — ABNORMAL LOW (ref >90)
GFR, EST NON AFRICAN AMERICAN: 63 mL/min — AB (ref >90)
Glucose, Bld: 173 mg/dL — ABNORMAL HIGH (ref 70–99)
Potassium: 3.6 mmol/L (ref 3.5–5.1)
SODIUM: 131 mmol/L — AB (ref 135–145)
Total Bilirubin: 1.1 mg/dL (ref 0.3–1.2)
Total Protein: 5.7 g/dL — ABNORMAL LOW (ref 6.0–8.3)

## 2014-09-16 LAB — CBC
HCT: 31.2 % — ABNORMAL LOW (ref 36.0–46.0)
Hemoglobin: 10.6 g/dL — ABNORMAL LOW (ref 12.0–15.0)
MCH: 30.2 pg (ref 26.0–34.0)
MCHC: 34 g/dL (ref 30.0–36.0)
MCV: 88.9 fL (ref 78.0–100.0)
PLATELETS: 116 10*3/uL — AB (ref 150–400)
RBC: 3.51 MIL/uL — ABNORMAL LOW (ref 3.87–5.11)
RDW: 13.8 % (ref 11.5–15.5)
WBC: 7.2 10*3/uL (ref 4.0–10.5)

## 2014-09-16 LAB — OSMOLALITY: OSMOLALITY: 205 mosm/kg — AB (ref 275–300)

## 2014-09-16 LAB — INFLUENZA PANEL BY PCR (TYPE A & B)
H1N1 flu by pcr: NOT DETECTED
INFLBPCR: NEGATIVE
Influenza A By PCR: NEGATIVE

## 2014-09-16 LAB — GLUCOSE, CAPILLARY
GLUCOSE-CAPILLARY: 163 mg/dL — AB (ref 70–99)
Glucose-Capillary: 147 mg/dL — ABNORMAL HIGH (ref 70–99)
Glucose-Capillary: 109 mg/dL — ABNORMAL HIGH (ref 70–99)

## 2014-09-16 MED ORDER — LORAZEPAM 1 MG PO TABS
1.0000 mg | ORAL_TABLET | ORAL | Status: DC | PRN
  Administered 2014-09-16 – 2014-09-18 (×4): 1 mg via ORAL
  Filled 2014-09-16 (×4): qty 1

## 2014-09-16 MED ORDER — INSULIN ASPART 100 UNIT/ML ~~LOC~~ SOLN
0.0000 [IU] | Freq: Three times a day (TID) | SUBCUTANEOUS | Status: DC
  Administered 2014-09-16: 1 [IU] via SUBCUTANEOUS
  Administered 2014-09-16: 2 [IU] via SUBCUTANEOUS
  Administered 2014-09-17: 3 [IU] via SUBCUTANEOUS
  Administered 2014-09-17: 1 [IU] via SUBCUTANEOUS
  Administered 2014-09-18: 2 [IU] via SUBCUTANEOUS
  Administered 2014-09-18 – 2014-09-20 (×6): 1 [IU] via SUBCUTANEOUS
  Administered 2014-09-21: 2 [IU] via SUBCUTANEOUS

## 2014-09-16 MED ORDER — ENOXAPARIN SODIUM 40 MG/0.4ML ~~LOC~~ SOLN
40.0000 mg | SUBCUTANEOUS | Status: DC
  Administered 2014-09-16 – 2014-09-21 (×6): 40 mg via SUBCUTANEOUS
  Filled 2014-09-16 (×6): qty 0.4

## 2014-09-16 MED ORDER — ATORVASTATIN CALCIUM 20 MG PO TABS
20.0000 mg | ORAL_TABLET | Freq: Every day | ORAL | Status: DC
  Administered 2014-09-16 – 2014-09-20 (×5): 20 mg via ORAL
  Filled 2014-09-16 (×5): qty 1

## 2014-09-16 MED ORDER — ACETAMINOPHEN 325 MG PO TABS
650.0000 mg | ORAL_TABLET | Freq: Four times a day (QID) | ORAL | Status: DC | PRN
  Administered 2014-09-16 – 2014-09-18 (×6): 650 mg via ORAL
  Filled 2014-09-16 (×7): qty 2

## 2014-09-16 MED ORDER — TRAZODONE HCL 50 MG PO TABS
25.0000 mg | ORAL_TABLET | Freq: Every day | ORAL | Status: DC
  Administered 2014-09-16 – 2014-09-20 (×5): 25 mg via ORAL
  Filled 2014-09-16 (×5): qty 1

## 2014-09-16 MED ORDER — HYDROCHLOROTHIAZIDE 12.5 MG PO CAPS
12.5000 mg | ORAL_CAPSULE | Freq: Every day | ORAL | Status: DC
  Administered 2014-09-16: 12.5 mg via ORAL
  Filled 2014-09-16: qty 1

## 2014-09-16 MED ORDER — ONDANSETRON HCL 4 MG PO TABS: 4.0000 mg | ORAL_TABLET | Freq: Four times a day (QID) | ORAL | Status: DC | PRN

## 2014-09-16 MED ORDER — DONEPEZIL HCL 5 MG PO TABS
10.0000 mg | ORAL_TABLET | Freq: Every day | ORAL | Status: DC
  Administered 2014-09-16 – 2014-09-20 (×5): 10 mg via ORAL
  Filled 2014-09-16 (×5): qty 2
  Filled 2014-09-16 (×2): qty 1

## 2014-09-16 MED ORDER — AMITRIPTYLINE HCL 25 MG PO TABS
75.0000 mg | ORAL_TABLET | Freq: Every day | ORAL | Status: DC
  Administered 2014-09-16 – 2014-09-20 (×5): 75 mg via ORAL
  Filled 2014-09-16 (×2): qty 1
  Filled 2014-09-16 (×5): qty 3

## 2014-09-16 MED ORDER — LOSARTAN POTASSIUM 50 MG PO TABS
100.0000 mg | ORAL_TABLET | Freq: Every day | ORAL | Status: DC
Start: 2014-09-16 — End: 2014-09-17
  Administered 2014-09-16: 100 mg via ORAL
  Filled 2014-09-16: qty 2

## 2014-09-16 MED ORDER — ENOXAPARIN SODIUM 40 MG/0.4ML ~~LOC~~ SOLN: 40.0000 mg | SUBCUTANEOUS | Status: DC

## 2014-09-16 MED ORDER — LEVOTHYROXINE SODIUM 112 MCG PO TABS
112.0000 ug | ORAL_TABLET | Freq: Every day | ORAL | Status: DC
  Administered 2014-09-16 – 2014-09-21 (×6): 112 ug via ORAL
  Filled 2014-09-16 (×6): qty 1

## 2014-09-16 MED ORDER — ACETAMINOPHEN 650 MG RE SUPP: 650.0000 mg | Freq: Four times a day (QID) | RECTAL | Status: DC | PRN

## 2014-09-16 MED ORDER — CITALOPRAM HYDROBROMIDE 20 MG PO TABS
10.0000 mg | ORAL_TABLET | Freq: Every day | ORAL | Status: DC
  Administered 2014-09-16 – 2014-09-21 (×6): 10 mg via ORAL
  Filled 2014-09-16 (×8): qty 1

## 2014-09-16 MED ORDER — LOSARTAN POTASSIUM-HCTZ 100-12.5 MG PO TABS: 1.0000 | ORAL_TABLET | Freq: Every day | ORAL | Status: DC

## 2014-09-16 MED ORDER — SODIUM CHLORIDE 0.9 % IV SOLN
INTRAVENOUS | Status: DC
  Administered 2014-09-16 – 2014-09-20 (×5): via INTRAVENOUS
  Administered 2014-09-21: 1 mL via INTRAVENOUS

## 2014-09-16 MED ORDER — CEFTRIAXONE SODIUM 1 G IJ SOLR
1.0000 g | INTRAMUSCULAR | Status: DC
  Administered 2014-09-17 – 2014-09-21 (×5): 1 g via INTRAVENOUS
  Filled 2014-09-16 (×6): qty 10

## 2014-09-16 MED ORDER — MEMANTINE HCL 10 MG PO TABS
10.0000 mg | ORAL_TABLET | Freq: Two times a day (BID) | ORAL | Status: DC
  Administered 2014-09-16 – 2014-09-21 (×11): 10 mg via ORAL
  Filled 2014-09-16 (×11): qty 1

## 2014-09-16 MED ORDER — ONDANSETRON HCL 4 MG/2ML IJ SOLN: 4.0000 mg | Freq: Four times a day (QID) | INTRAMUSCULAR | Status: DC | PRN

## 2014-09-16 MED ORDER — CIPROFLOXACIN IN D5W 400 MG/200ML IV SOLN
400.0000 mg | Freq: Two times a day (BID) | INTRAVENOUS | Status: DC
  Administered 2014-09-16 – 2014-09-17 (×4): 400 mg via INTRAVENOUS
  Filled 2014-09-16 (×5): qty 200

## 2014-09-16 NOTE — Progress Notes (Signed)
Patient has not voided this shift. Bladder scan revealed over than 200cc. Dr. Luan Pulling notified. New order for Foley catheter.

## 2014-09-16 NOTE — Care Management Note (Signed)
    Page 1 of 2   09/21/2014     11:42:32 AM CARE MANAGEMENT NOTE 09/21/2014  Patient:  [name], [name]   Account Number:  [id number]  Date Initiated:  09/16/2014  Documentation initiated by:  Vladimir Creeks  Subjective/Objective Assessment:   Admitted with UTI, fever. Pt is from [facility] and will return there at D/C. CSW is aware and will facilitate return at D/C     Action/Plan:   Anticipated DC Date:  09/18/2014   Anticipated DC Plan:  ASSISTED LIVING / [place]  In-house referral  Clinical Social Worker      DC Forensic scientist  CM consult      [facility] Choice  HOME HEALTH   Choice offered to / List presented to:  C-4 Adult Children        [place] arranged  HH-1 RN  [place].   Status of service:  Completed, signed off Medicare Important Message given?  YES (If response is "NO", the following Medicare IM given date fields will be blank) Date Medicare IM given:  09/17/2014 Medicare IM given by:  Vladimir Creeks Date Additional Medicare IM given:  09/21/2014 Additional Medicare IM given by:  Theophilus Kinds  Discharge Disposition:  ASSISTED LIVING  Per UR Regulation:  Reviewed for med. necessity/level of care/duration of stay  If discussed at [place] of Stay Meetings, dates discussed:   09/21/2014    Comments:  09/21/14 [place], RN BSN CM Pt discharged back to [facility] today with [facility] RN and PT. Romualdo Bolk of [facility] is aware and will collect the pts information from the chart. [place] services to start within 48 hours of discharge. No DME needs noted. Pts family and pts nurse aware of discharge arrangements.  09/16/14 1600 Vladimir Creeks RN/CM

## 2014-09-16 NOTE — Clinical Social Work Psychosocial (Signed)
Clinical Social Work Department BRIEF PSYCHOSOCIAL ASSESSMENT 09/16/2014  Patient:  [name], [name]     Account Number:  [id number]     Admit date:  09/15/2014  Clinical Social Worker:  Wyatt Haste  Date/Time:  09/16/2014 11:35 AM  Referred by:  CSW  Date Referred:  09/16/2014 Referred for  ALF Placement   Other Referral:   Interview type:  Family Other interview type:   son- Elta Guadeloupe    PSYCHOSOCIAL DATA Living Status:  FACILITY Admitted from facility:  Other Level of care:  Family Care Home Primary support name:  [name]/Mark Primary support relationship to patient:  CHILD, ADULT Degree of support available:   supportive    CURRENT CONCERNS Current Concerns  Post-Acute Placement   Other Concerns:    SOCIAL WORK ASSESSMENT / PLAN CSW met with pt's son, Elta Guadeloupe at bedside. Pt sleeping during assessment. Elta Guadeloupe reports that pt's daughter, Shirlean Mylar is HCPOA but is sleeping as she spent the night at hospital. Pt has been a resident at [facility] for about 2 years. She was diagnosed with dementia around the same time and PCP recommended placement. Pt is family to [facility]. Children live locally and are very involved and supportive. Elta Guadeloupe states that pt has adjusted well and has her dog there with her. He shares that he is glad that family can be her caregivers. CSW spoke with Pam at [facility] who reports pt had been very weak recently and had a fever yesterday. Admitted with UTI. Pt did not have home health prior to admission. She has been fairly independent at baseline and ambulates without assistive device. Okay to return at d/c.   Assessment/plan status:  Psychosocial Support/Ongoing Assessment of Needs Other assessment/ plan:   Information/referral to community resources:    PATIENT'S/FAMILY'S RESPONSE TO PLAN OF CARE: Pt sleeping during assessment. Family report very positive feelings regarding return to [place] when medically stable. CSW will continue  to follow.       Benay Pike, [place]

## 2014-09-16 NOTE — Progress Notes (Signed)
ANTIBIOTIC CONSULT NOTE - INITIAL  Pharmacy Consult for Cipro Indication: UTI  Allergies  Allergen Reactions  . Sulfonamide Derivatives Anaphylaxis, Swelling and Rash   Patient Measurements: Height: 5\' 4"  (162.6 cm) Weight: 178 lb 2.1 oz (80.8 kg) IBW/kg (Calculated) : 54.7  Vital Signs: Temp: 102.8 F (39.3 C) (02/11 0822) Temp Source: Oral (02/11 0822) BP: 121/51 mmHg (02/11 0554) Pulse Rate: 83 (02/11 0554) Intake/Output from previous day:   Intake/Output from this shift: Total I/O In: -  Out: 250 [Urine:250]  Labs:  Recent Labs  09/15/14 2217 09/16/14 0646  WBC 8.7 7.2  HGB 12.1 10.6*  PLT 127* 116*  CREATININE 0.92 0.84   Estimated Creatinine Clearance: 53.1 mL/min (by C-G formula based on Cr of 0.84). No results for input(s): VANCOTROUGH, VANCOPEAK, VANCORANDOM, GENTTROUGH, GENTPEAK, GENTRANDOM, TOBRATROUGH, TOBRAPEAK, TOBRARND, AMIKACINPEAK, AMIKACINTROU, AMIKACIN in the last 72 hours.   Microbiology: Recent Results (from the past 720 hour(s))  Blood culture (routine x 2)     Status: None (Preliminary result)   Collection Time: 09/15/14 10:24 PM  Result Value Ref Range Status   Specimen Description RIGHT ANTECUBITAL  Final   Special Requests BOTTLES DRAWN AEROBIC AND ANAEROBIC [place]  Final   Culture NO GROWTH 1 DAY  Final   Report Status PENDING  Incomplete  Blood culture (routine x 2)     Status: None (Preliminary result)   Collection Time: 09/15/14 10:24 PM  Result Value Ref Range Status   Specimen Description BLOOD LEFT HAND  Final   Special Requests BOTTLES DRAWN AEROBIC AND ANAEROBIC 6CC EACH  Final   Culture NO GROWTH 1 DAY  Final   Report Status PENDING  Incomplete   Medical History: Past Medical History  Diagnosis Date  . Thyroid disease   . Hypertension   . Gout   . Dementia   . Vertigo   . Low back pain   . Hypothyroidism   . Arthritis   . Hyperlipidemia   . Hemorrhoids with complication   . Cancer 2012    Melanoma-Face  Removed at [facility]  . Bladder cancer     saw dr. Rosana Hoes had surgery had follow about 3 years ago  . Atrial fibrillation   . Migraine   . Depression   . Hyperglycemia   . Alzheimer disease    Anti-infectives    Start     Dose/Rate Route Frequency Ordered Stop   09/17/14 0000  cefTRIAXone (ROCEPHIN) 1 g in dextrose 5 % 50 mL IVPB     1 g 100 mL/hr over 30 Minutes Intravenous Every 24 hours 09/16/14 0145     09/16/14 1000  ciprofloxacin (CIPRO) IVPB 400 mg     400 mg 200 mL/hr over 60 Minutes Intravenous Every 12 hours 09/16/14 0831     09/15/14 2300  cefTRIAXone (ROCEPHIN) 1 g in dextrose 5 % 50 mL IVPB     1 g 100 mL/hr over 30 Minutes Intravenous  Once 09/15/14 2253 09/15/14 2354     Assessment: 81yo female with fever and UTI.  Rocephin started initially and then asked to add Cipro. SCr is at baseline.  Tm 102  Goal of Therapy:  Eradicate infection.  Plan:  Continue Rocephin q24hrs as ordered Cipro 400mg  IV q12hrs Monitor labs, cultures, and progress Deescalate ABX when appropriate  [place] Crane, Aron Needles A 09/16/2014,11:19 AM

## 2014-09-16 NOTE — Progress Notes (Signed)
Subjective: She was admitted with presumed urinary tract infection. She had a urinalysis obtained yesterday after developing more confusion and fever and chills. I sent an antibiotic to her store but apparently before she could start that she got worse and was brought to the emergency department. She still has fever as high as 102.5.  Objective: Vital signs in last 24 hours: Temp:  [98.8 F (37.1 C)-103 F (39.4 C)] 102.8 F (39.3 C) (02/11 0822) Pulse Rate:  [68-118] 83 (02/11 0554) Resp:  [19-24] 22 (02/11 0527) BP: (85-223)/(43-166) 121/51 mmHg (02/11 0554) SpO2:  [89 %-100 %] 97 % (02/11 0554) Weight:  [80.8 kg (178 lb 2.1 oz)-81.647 kg (180 lb)] 80.8 kg (178 lb 2.1 oz) (02/11 0147) Weight change:     Intake/Output from previous day:    PHYSICAL EXAM General appearance: no distress and Sluggish and coughing occasionally during the exam Resp: clear to auscultation bilaterally Cardio: regular rate and rhythm, S1, S2 normal, no murmur, click, rub or gallop GI: soft, non-tender; bowel sounds normal; no masses,  no organomegaly Extremities: extremities normal, atraumatic, no cyanosis or edema  Lab Results:  Results for orders placed or performed during the hospital encounter of 09/15/14 (from the past 48 hour(s))  Urinalysis, Routine w reflex microscopic     Status: Abnormal   Collection Time: 09/15/14 10:15 PM  Result Value Ref Range   Color, Urine YELLOW YELLOW   APPearance HAZY (A) CLEAR   Specific Gravity, Urine 1.015 1.005 - 1.030   pH 6.5 5.0 - 8.0   Glucose, UA NEGATIVE NEGATIVE mg/dL   Hgb urine dipstick TRACE (A) NEGATIVE   Bilirubin Urine NEGATIVE NEGATIVE   Ketones, ur 15 (A) NEGATIVE mg/dL   Protein, ur TRACE (A) NEGATIVE mg/dL   Urobilinogen, UA 0.2 0.0 - 1.0 mg/dL   Nitrite POSITIVE (A) NEGATIVE   Leukocytes, UA SMALL (A) NEGATIVE  Urine microscopic-add on     Status: Abnormal   Collection Time: 09/15/14 10:15 PM  Result Value Ref Range   Squamous  Epithelial / LPF FEW (A) RARE   WBC, UA TOO NUMEROUS TO COUNT <3 WBC/hpf   RBC / HPF 3-6 <3 RBC/hpf   Bacteria, UA MANY (A) RARE  CBC with Differential     Status: Abnormal   Collection Time: 09/15/14 10:17 PM  Result Value Ref Range   WBC 8.7 4.0 - 10.5 K/uL   RBC 3.95 3.87 - 5.11 MIL/uL   Hemoglobin 12.1 12.0 - 15.0 g/dL   HCT 42.7 (L) 15.6 - 64.8 %   MCV 88.1 78.0 - 100.0 fL   MCH 30.6 26.0 - 34.0 pg   MCHC 34.8 30.0 - 36.0 g/dL   RDW 30.3 22.0 - 19.9 %   Platelets 127 (L) 150 - 400 K/uL   Neutrophils Relative % 88 (H) 43 - 77 %   Neutro Abs 7.7 1.7 - 7.7 K/uL   Lymphocytes Relative 6 (L) 12 - 46 %   Lymphs Abs 0.5 (L) 0.7 - 4.0 K/uL   Monocytes Relative 6 3 - 12 %   Monocytes Absolute 0.5 0.1 - 1.0 K/uL   Eosinophils Relative 0 0 - 5 %   Eosinophils Absolute 0.0 0.0 - 0.7 K/uL   Basophils Relative 0 0 - 1 %   Basophils Absolute 0.0 0.0 - 0.1 K/uL  Comprehensive metabolic panel     Status: Abnormal   Collection Time: 09/15/14 10:17 PM  Result Value Ref Range   Sodium 128 (L) 135 - 145 mmol/L  Potassium 3.5 3.5 - 5.1 mmol/L   Chloride 96 96 - 112 mmol/L   CO2 25 19 - 32 mmol/L   Glucose, Bld 188 (H) 70 - 99 mg/dL   BUN 16 6 - 23 mg/dL   Creatinine, Ser 4.99 0.50 - 1.10 mg/dL   Calcium 8.6 8.4 - 69.2 mg/dL   Total Protein 6.9 6.0 - 8.3 g/dL   Albumin 3.5 3.5 - 5.2 g/dL   AST 35 0 - 37 U/L   ALT 32 0 - 35 U/L   Alkaline Phosphatase 76 39 - 117 U/L   Total Bilirubin 1.3 (H) 0.3 - 1.2 mg/dL   GFR calc non Af Amer 56 (L) >90 mL/min   GFR calc Af Amer 65 (L) >90 mL/min    Comment: (NOTE) The eGFR has been calculated using the CKD EPI equation. This calculation has not been validated in all clinical situations. eGFR's persistently <90 mL/min signify possible Chronic Kidney Disease.    Anion gap 7 5 - 15  Blood culture (routine x 2)     Status: None (Preliminary result)   Collection Time: 09/15/14 10:24 PM  Result Value Ref Range   Specimen Description RIGHT  ANTECUBITAL    Special Requests BOTTLES DRAWN AEROBIC AND ANAEROBIC [facility] EACH    Culture PENDING    Report Status PENDING   Blood culture (routine x 2)     Status: None (Preliminary result)   Collection Time: 09/15/14 10:24 PM  Result Value Ref Range   Specimen Description BLOOD LEFT HAND    Special Requests BOTTLES DRAWN AEROBIC AND ANAEROBIC [facility] EACH    Culture PENDING    Report Status PENDING   I-Stat CG4 Lactic Acid, ED     Status: None   Collection Time: 09/15/14 10:27 PM  Result Value Ref Range   Lactic Acid, Venous 1.10 0.5 - 2.0 mmol/L  CBC     Status: Abnormal   Collection Time: 09/16/14  6:46 AM  Result Value Ref Range   WBC 7.2 4.0 - 10.5 K/uL   RBC 3.51 (L) 3.87 - 5.11 MIL/uL   Hemoglobin 10.6 (L) 12.0 - 15.0 g/dL   HCT 49.3 (L) 24.1 - 99.1 %   MCV 88.9 78.0 - 100.0 fL   MCH 30.2 26.0 - 34.0 pg   MCHC 34.0 30.0 - 36.0 g/dL   RDW 44.4 58.4 - 83.5 %   Platelets 116 (L) 150 - 400 K/uL    Comment: SPECIMEN CHECKED FOR CLOTS CONSISTENT WITH PREVIOUS RESULT   Comprehensive metabolic panel     Status: Abnormal   Collection Time: 09/16/14  6:46 AM  Result Value Ref Range   Sodium 131 (L) 135 - 145 mmol/L   Potassium 3.6 3.5 - 5.1 mmol/L   Chloride 104 96 - 112 mmol/L    Comment: DELTA CHECK NOTED   CO2 22 19 - 32 mmol/L   Glucose, Bld 173 (H) 70 - 99 mg/dL   BUN 14 6 - 23 mg/dL   Creatinine, Ser 0.75 0.50 - 1.10 mg/dL   Calcium 7.8 (L) 8.4 - 10.5 mg/dL   Total Protein 5.7 (L) 6.0 - 8.3 g/dL   Albumin 2.8 (L) 3.5 - 5.2 g/dL   AST 40 (H) 0 - 37 U/L   ALT 39 (H) 0 - 35 U/L   Alkaline Phosphatase 65 39 - 117 U/L   Total Bilirubin 1.1 0.3 - 1.2 mg/dL   GFR calc non Af Amer 63 (L) >90 mL/min   GFR calc  Af Amer 73 (L) >90 mL/min    Comment: (NOTE) The eGFR has been calculated using the CKD EPI equation. This calculation has not been validated in all clinical situations. eGFR's persistently <90 mL/min signify possible Chronic Kidney Disease.    Anion gap 5 5 - 15     ABGS No results for input(s): PHART, PO2ART, TCO2, HCO3 in the last 72 hours.  Invalid input(s): PCO2 CULTURES Recent Results (from the past 240 hour(s))  Blood culture (routine x 2)     Status: None (Preliminary result)   Collection Time: 09/15/14 10:24 PM  Result Value Ref Range Status   Specimen Description RIGHT ANTECUBITAL  Final   Special Requests BOTTLES DRAWN AEROBIC AND ANAEROBIC [facility] EACH  Final   Culture PENDING  Incomplete   Report Status PENDING  Incomplete  Blood culture (routine x 2)     Status: None (Preliminary result)   Collection Time: 09/15/14 10:24 PM  Result Value Ref Range Status   Specimen Description BLOOD LEFT HAND  Final   Special Requests BOTTLES DRAWN AEROBIC AND ANAEROBIC [place]  Final   Culture PENDING  Incomplete   Report Status PENDING  Incomplete   Studies/Results: No results found.  Medications:  Prior to Admission:  Prescriptions prior to admission  Medication Sig Dispense Refill Last Dose  . allopurinol (ZYLOPRIM) 100 MG tablet TAKE ONE TABLET BY MOUTH ONCE DAILY. 30 tablet 9 09/15/2014 at Unknown time  . amitriptyline (ELAVIL) 75 MG tablet TAKE (1) TABLET BY MOUTH AT BEDTIME. 30 tablet 5 09/14/2014 at Unknown time  . atorvastatin (LIPITOR) 20 MG tablet TAKE (1) TABLET BY MOUTH AT BEDTIME. 30 tablet 9 09/14/2014 at Unknown time  . citalopram (CELEXA) 10 MG tablet TAKE ONE TABLET BY MOUTH ONCE DAILY. 30 tablet 9 09/15/2014 at Unknown time  . donepezil (ARICEPT) 10 MG tablet Take 10 mg by mouth at bedtime.   09/15/2014 at Unknown time  . Lactobacillus (ACIDOPHILUS) TABS TAKE ONE TABLET BY MOUTH ONCE DAILY. 30 each 5 09/15/2014 at Unknown time  . Lactobacillus (ACIDOPHILUS) TABS Take 1 tablet by mouth daily.   09/15/2014 at Unknown time  . levothyroxine (SYNTHROID, LEVOTHROID) 112 MCG tablet Take 1 tablet (112 mcg total) by mouth daily. 30 tablet 3 09/15/2014 at Unknown time  . linagliptin (TRADJENTA) 5 MG TABS tablet Take 5 mg by mouth daily.    09/15/2014 at Unknown time  . LORazepam (ATIVAN) 2 MG tablet Take 1 mg by mouth 5 (five) times daily. As needed for anxiety   09/14/2014 at Unknown time  . losartan-hydrochlorothiazide (HYZAAR) 100-12.5 MG per tablet TAKE ONE TABLET BY MOUTH ONCE DAILY. 30 tablet 9 09/15/2014 at Unknown time  . Multiple Vitamins-Minerals (PRESERVISION/LUTEIN PO) Take 1 tablet by mouth 2 (two) times daily.    09/15/2014 at Unknown time  . NAMENDA 10 MG tablet TAKE (1) TABLET BY MOUTH TWICE DAILY. 60 tablet 9 09/15/2014 at Unknown time  . traZODone (DESYREL) 50 MG tablet Take 25 mg by mouth at bedtime.   09/15/2014 at Unknown time  . acetaminophen (TYLENOL) 500 MG tablet Take 500-1,000 mg by mouth every 6 (six) hours as needed (pain(Max 5 tablets per 24hours)).    unknown  . ALPRAZolam (XANAX) 0.25 MG tablet TAKE 1 TABLET BY MOUTH TWICE DAILY AT 8AM AND 2PM. (Patient not taking: Reported on 09/15/2014) 60 tablet 5 Taking  . nystatin-triamcinolone (MYCOLOG II) cream Apply 1 application topically 2 (two) times daily as needed (rash).    unknown   Scheduled: .  amitriptyline  75 mg Oral QHS  . atorvastatin  20 mg Oral q1800  . [START ON 09/17/2014] cefTRIAXone (ROCEPHIN)  IV  1 g Intravenous Q24H  . ciprofloxacin  400 mg Intravenous Q12H  . citalopram  10 mg Oral Daily  . donepezil  10 mg Oral QHS  . enoxaparin (LOVENOX) injection  40 mg Subcutaneous Q24H  . hydrochlorothiazide  12.5 mg Oral Daily  . insulin aspart  0-9 Units Subcutaneous TID WC  . levothyroxine  112 mcg Oral QAC breakfast  . losartan  100 mg Oral Daily  . memantine  10 mg Oral BID  . traZODone  25 mg Oral QHS   Continuous: . sodium chloride 75 mL/hr at 09/16/14 0225   FHQ:[id number] **OR** acetaminophen, LORazepam, ondansetron **OR** ondansetron (ZOFRAN) IV  Assesment: She has a urinary tract infection. She has worsened problems with dementia from this. She is coughing and with the fever I think we need to be sure she doesn't have  influenza. Principal Problem:   UTI (lower urinary tract infection) Active Problems:   Hypothyroidism   Essential hypertension   CARDIAC MURMUR   Alzheimer's disease    Plan: Start her on a cooling blanket. Add IV Cipro. Continue other treatments. Check influenza PCR    LOS: 0 days   Jamill Wetmore L 09/16/2014, 8:45 AM

## 2014-09-16 NOTE — H&P (Addendum)
PCP:   Sallee Lange, MD   Chief Complaint:  Fever  HPI:  81 year old female who  has a past medical history of Thyroid disease; Hypertension; Gout; Dementia; Vertigo; Low back pain; Hypothyroidism; Arthritis; Hyperlipidemia; Hemorrhoids with complication; Cancer (2012); Bladder cancer; Atrial fibrillation; Migraine; Depression; Hyperglycemia; and Alzheimer disease. Patient is a poor historian and history provided by patient's daughter at bedside. As per daughter patient has dementia and has limited communication. Yesterday patient had a UA obtained for possible UTI and was [facility]. Today patient developed fever with chills and was sent to the ED for further evaluation. Patient found to have UTI and started on IV Rocephin. There is no history of nausea vomiting or diarrhea. Patient is a poor historian and unable to provide any review of systems. Though she denies pain or any other complaints at this time.  Allergies:   Allergies  Allergen Reactions  . Sulfonamide Derivatives Anaphylaxis, Swelling and Rash      Past Medical History  Diagnosis Date  . Thyroid disease   . Hypertension   . Gout   . Dementia   . Vertigo   . Low back pain   . Hypothyroidism   . Arthritis   . Hyperlipidemia   . Hemorrhoids with complication   . Cancer 2012    Melanoma-Face Removed at [facility]  . Bladder cancer     saw dr. Rosana Hoes had surgery had follow about 3 years ago  . Atrial fibrillation   . Migraine   . Depression   . Hyperglycemia   . Alzheimer disease     Past Surgical History  Procedure Laterality Date  . Cholecystectomy    . Colonoscopy  11/21/09    Simple adenoma/pancolonic diverticulosis(next on due in 5/10 years)  . Eye surgery      Bilateral Cataract Surgery  . Flexible sigmoidoscopy  12/03/2011    Procedure: FLEXIBLE SIGMOIDOSCOPY;  Surgeon: Danie Binder, MD;  Location: [facility] ENDO [facility];  Service: Endoscopy;  Laterality: N/A;  12:00  . Bladder surgery       remove cancer  . Melanoma excision      on face    Prior to Admission medications   Medication Sig Start Date End Date Taking? Authorizing Provider  allopurinol (ZYLOPRIM) 100 MG tablet TAKE ONE TABLET BY MOUTH ONCE DAILY. 03/24/13  Yes Kathyrn Drown, MD  amitriptyline (ELAVIL) 75 MG tablet TAKE (1) TABLET BY MOUTH AT BEDTIME. 06/22/13  Yes Kathyrn Drown, MD  atorvastatin (LIPITOR) 20 MG tablet TAKE (1) TABLET BY MOUTH AT BEDTIME. 03/24/13  Yes Kathyrn Drown, MD  citalopram (CELEXA) 10 MG tablet TAKE ONE TABLET BY MOUTH ONCE DAILY. 03/24/13  Yes Kathyrn Drown, MD  donepezil (ARICEPT) 10 MG tablet Take 10 mg by mouth at bedtime.   Yes Historical Provider, MD  Lactobacillus (ACIDOPHILUS) TABS TAKE ONE TABLET BY MOUTH ONCE DAILY. 06/22/13  Yes Kathyrn Drown, MD  Lactobacillus (ACIDOPHILUS) TABS Take 1 tablet by mouth daily.   Yes Historical Provider, MD  levothyroxine (SYNTHROID, LEVOTHROID) 112 MCG tablet Take 1 tablet (112 mcg total) by mouth daily. 11/03/13  Yes Kathyrn Drown, MD  linagliptin (TRADJENTA) 5 MG TABS tablet Take 5 mg by mouth daily.   Yes Historical Provider, MD  LORazepam (ATIVAN) 2 MG tablet Take 1 mg by mouth 5 (five) times daily. As needed for anxiety   Yes Historical Provider, MD  losartan-hydrochlorothiazide (HYZAAR) 100-12.5 MG per tablet TAKE ONE TABLET BY  MOUTH ONCE DAILY. 03/24/13  Yes Kathyrn Drown, MD  Multiple Vitamins-Minerals (PRESERVISION/LUTEIN PO) Take 1 tablet by mouth 2 (two) times daily.    Yes Historical Provider, MD  NAMENDA 10 MG tablet TAKE (1) TABLET BY MOUTH TWICE DAILY. 03/24/13  Yes Kathyrn Drown, MD  traZODone (DESYREL) 50 MG tablet Take 25 mg by mouth at bedtime.   Yes Historical Provider, MD  acetaminophen (TYLENOL) 500 MG tablet Take 500-1,000 mg by mouth every 6 (six) hours as needed (pain(Max 5 tablets per 24hours)).     Historical Provider, MD  ALPRAZolam (XANAX) 0.25 MG tablet TAKE 1 TABLET BY MOUTH TWICE DAILY AT 8AM AND 2PM. Patient  not taking: Reported on 09/15/2014 08/24/13   Kathyrn Drown, MD  nystatin-triamcinolone (MYCOLOG II) cream Apply 1 application topically 2 (two) times daily as needed (rash).  12/11/12   Historical Provider, MD    Social History:  reports that she has never smoked. She has never used smokeless tobacco. She reports that she does not drink alcohol or use illicit drugs.  Family History  Problem Relation Age of Onset  . Migraines Mother   . Arthritis Father   . Diabetes Father   . Cancer Brother   . Diabetes Brother   . Breast cancer Daughter   . Diabetes Brother        Review of Systems:  Unable to obtain due to patient's underlying dementia   Physical Exam: Blood pressure 121/79, pulse 107, temperature 101.9 F (38.8 C), temperature source Rectal, resp. rate 22, height 5\' 6"  (1.676 m), weight 81.647 kg (180 lb), SpO2 96 %. Constitutional:   Patient is a well-developed and well-nourished female* in no acute distress and cooperative with exam. Head: Normocephalic and atraumatic Mouth: Mucus membranes moist Eyes: PERRL, EOMI, conjunctivae normal Neck: Supple, No Thyromegaly Cardiovascular: RRR, S1 normal, S2 normal, grade 2/6 systolic murmur auscultated at the mitral area Pulmonary/Chest: CTAB, no wheezes, rales, or rhonchi Abdominal: Soft. Non-tender, non-distended, bowel sounds are normal, no masses, organomegaly, or guarding present.  Neurological: Alert but not oriented 2 Strength is normal and symmetric bilaterally, cranial nerve II-XII are grossly intact, no focal motor deficit, sensory intact to light touch bilaterally.  Extremities : No Cyanosis, Clubbing or Edema  Labs on Admission:  Basic Metabolic Panel:  Recent Labs Lab 09/15/14 2217  NA 128*  K 3.5  CL 96  CO2 25  GLUCOSE 188*  BUN 16  CREATININE 0.92  CALCIUM 8.6   Liver Function Tests:  Recent Labs Lab 09/15/14 2217  AST 35  ALT 32  ALKPHOS 76  BILITOT 1.3*  PROT 6.9  ALBUMIN 3.5   No results  for input(s): LIPASE, AMYLASE in the last 168 hours. No results for input(s): AMMONIA in the last 168 hours. CBC:  Recent Labs Lab 09/15/14 2217  WBC 8.7  NEUTROABS 7.7  HGB 12.1  HCT 34.8*  MCV 88.1  PLT 127*    Radiological Exams on Admission: No results found.  EKG: Independently reviewed. Sinus tachycardia   Assessment/Plan Principal Problem:   UTI (lower urinary tract infection) Active Problems:   Hypothyroidism   Essential hypertension   CARDIAC MURMUR   Alzheimer's disease  UTI Patient is abnormal UA and has been started on Rocephin. We'll continue with Rocephin 1 g IV every 24 hours. We'll obtain urine culture and follow the urine culture results.  Diabetes mellitus We'll hold the oral hypoglycemic agents and start sliding scale insulin with NovoLog.  Dementia Patient has as  him as disease we'll continue with Aricept and Namenda. Also continue with Ativan 1 mg every 4 hours when necessary for anxiety.  Hypothyroidism Continue Synthroid  Hyponatremia Likely due to dehydration. Will check serum osmolarity and start IV normal saline at 75 MR per hour. Check BMP in a.m.   DVT prophylaxis Lovenox  Code status: Patient is DO NOT RESUSCITATE  Family discussion: Admission, patients condition and plan of care including tests being ordered have been discussed with*her daughter at bedside who indicate understanding and agree with the plan and Code Status.   Time Spent on Admission: 60 minutes  [facility] Pager: [phone] 09/16/2014, 12:27 AM  If 7PM-7AM, please contact night-coverage  [email or web address]  Password [email or web address]

## 2014-09-16 NOTE — Progress Notes (Signed)
Notified Dr.Hawkins of patients lab results being positive for gram negative rods in blood.

## 2014-09-16 NOTE — Care Management Utilization Note (Signed)
UR completed 

## 2014-09-17 DIAGNOSIS — R7881 Bacteremia: Secondary | ICD-10-CM | POA: Diagnosis present

## 2014-09-17 DIAGNOSIS — A419 Sepsis, unspecified organism: Secondary | ICD-10-CM | POA: Diagnosis present

## 2014-09-17 LAB — GLUCOSE, CAPILLARY
GLUCOSE-CAPILLARY: 130 mg/dL — AB (ref 70–99)
GLUCOSE-CAPILLARY: 86 mg/dL (ref 70–99)
Glucose-Capillary: 191 mg/dL — ABNORMAL HIGH (ref 70–99)
Glucose-Capillary: 204 mg/dL — ABNORMAL HIGH (ref 70–99)
Glucose-Capillary: 154 mg/dL — ABNORMAL HIGH (ref 70–99)

## 2014-09-17 LAB — CBC WITH DIFFERENTIAL/PLATELET
Basophils Absolute: 0 10*3/uL (ref 0.0–0.1)
Basophils Relative: 0 % (ref 0–1)
EOS PCT: 0 % (ref 0–5)
Eosinophils Absolute: 0 10*3/uL (ref 0.0–0.7)
HCT: 31.1 % — ABNORMAL LOW (ref 36.0–46.0)
Hemoglobin: 10.5 g/dL — ABNORMAL LOW (ref 12.0–15.0)
LYMPHS ABS: 1 10*3/uL (ref 0.7–4.0)
Lymphocytes Relative: 15 % (ref 12–46)
MCH: 29.8 pg (ref 26.0–34.0)
MCHC: 33.8 g/dL (ref 30.0–36.0)
MCV: 88.4 fL (ref 78.0–100.0)
MONO ABS: 0.7 10*3/uL (ref 0.1–1.0)
MONOS PCT: 10 % (ref 3–12)
Neutro Abs: 5.1 10*3/uL (ref 1.7–7.7)
Neutrophils Relative %: 75 % (ref 43–77)
PLATELETS: 103 10*3/uL — AB (ref 150–400)
RBC: 3.52 MIL/uL — ABNORMAL LOW (ref 3.87–5.11)
RDW: 13.6 % (ref 11.5–15.5)
WBC: 6.8 10*3/uL (ref 4.0–10.5)

## 2014-09-17 LAB — BASIC METABOLIC PANEL
ANION GAP: 10 (ref 5–15)
BUN: 10 mg/dL (ref 6–23)
CHLORIDE: 97 mmol/L (ref 96–112)
CO2: 26 mmol/L (ref 19–32)
CREATININE: 0.96 mg/dL (ref 0.50–1.10)
Calcium: 8.2 mg/dL — ABNORMAL LOW (ref 8.4–10.5)
GFR calc Af Amer: 62 mL/min — ABNORMAL LOW (ref >90)
GFR calc non Af Amer: 54 mL/min — ABNORMAL LOW (ref >90)
GLUCOSE: 154 mg/dL — AB (ref 70–99)
Potassium: 3.2 mmol/L — ABNORMAL LOW (ref 3.5–5.1)
SODIUM: 133 mmol/L — AB (ref 135–145)

## 2014-09-17 LAB — HEMOGLOBIN A1C
HEMOGLOBIN A1C: 6.2 % — AB (ref 4.8–5.6)
MEAN PLASMA GLUCOSE: 131 mg/dL

## 2014-09-17 MED ORDER — LORAZEPAM 2 MG/ML IJ SOLN
1.0000 mg | INTRAMUSCULAR | Status: DC | PRN
  Administered 2014-09-17 – 2014-09-19 (×3): 1 mg via INTRAVENOUS
  Filled 2014-09-17 (×3): qty 1

## 2014-09-17 MED ORDER — POTASSIUM CHLORIDE CRYS ER 20 MEQ PO TBCR
20.0000 meq | EXTENDED_RELEASE_TABLET | Freq: Two times a day (BID) | ORAL | Status: DC
  Administered 2014-09-17 – 2014-09-21 (×9): 20 meq via ORAL
  Filled 2014-09-17 (×9): qty 1

## 2014-09-17 NOTE — Progress Notes (Signed)
ANTIBIOTIC CONSULT NOTE - follow up  Pharmacy Consult for Cipro / Rocephin Indication: UTI  Allergies  Allergen Reactions  . Sulfonamide Derivatives Anaphylaxis, Swelling and Rash   Patient Measurements: Height: 5\' 4"  (162.6 cm) Weight: 178 lb 2.1 oz (80.8 kg) IBW/kg (Calculated) : 54.7  Vital Signs: Temp: 100.8 F (38.2 C) (02/12 1056) Temp Source: Rectal (02/12 1056) BP: 98/49 mmHg (02/12 0501) Pulse Rate: 85 (02/12 0501) Intake/Output from previous day: 02/11 0701 - 02/12 0700 In: 3158.8 [P.O.:640; I.V.:2068.8; IV Piggyback:450] Out: 2450 [Urine:2450] Intake/Output from this shift:    Labs:  Recent Labs  09/15/14 2217 09/16/14 0646 09/17/14 0616  WBC 8.7 7.2 6.8  HGB 12.1 10.6* 10.5*  PLT 127* 116* 103*  CREATININE 0.92 0.84 0.96   Estimated Creatinine Clearance: 46.4 mL/min (by C-G formula based on Cr of 0.96). No results for input(s): VANCOTROUGH, VANCOPEAK, VANCORANDOM, GENTTROUGH, GENTPEAK, GENTRANDOM, TOBRATROUGH, TOBRAPEAK, TOBRARND, AMIKACINPEAK, AMIKACINTROU, AMIKACIN in the last 72 hours.   Microbiology: Recent Results (from the past 720 hour(s))  Blood culture (routine x 2)     Status: None (Preliminary result)   Collection Time: 09/15/14 10:24 PM  Result Value Ref Range Status   Specimen Description RIGHT ANTECUBITAL  Final   Special Requests BOTTLES DRAWN AEROBIC AND ANAEROBIC [facility] EACH  Final   Culture   Final    GRAM NEGATIVE RODS Note: Gram Stain Report Called to,Read Back By and Verified With: SANTOS K 1258 09/16/14 BY Tyrone Schimke M Performed at [facility] Performed at [facility]    Report Status PENDING  Incomplete  Blood culture (routine x 2)     Status: None (Preliminary result)   Collection Time: 09/15/14 10:24 PM  Result Value Ref Range Status   Specimen Description BLOOD LEFT HAND  Final   Special Requests BOTTLES DRAWN AEROBIC AND ANAEROBIC [facility] EACH  Final   Culture   Final    GRAM NEGATIVE RODS Note: Gram Stain  Report Called to,Read Back By and Verified With: SANTOS K 12:58 09/16/14 BY Elza Rafter Performed at [facility] Performed at [facility]    Report Status PENDING  Incomplete   Medical History: Past Medical History  Diagnosis Date  . Thyroid disease   . Hypertension   . Gout   . Dementia   . Vertigo   . Low back pain   . Hypothyroidism   . Arthritis   . Hyperlipidemia   . Hemorrhoids with complication   . Cancer 2012    Melanoma-Face Removed at [facility]  . Bladder cancer     saw dr. Rosana Hoes had surgery had follow about 3 years ago  . Atrial fibrillation   . Migraine   . Depression   . Hyperglycemia   . Alzheimer disease    Anti-infectives    Start     Dose/Rate Route Frequency Ordered Stop   09/17/14 0000  cefTRIAXone (ROCEPHIN) 1 g in dextrose 5 % 50 mL IVPB     1 g 100 mL/hr over 30 Minutes Intravenous Every 24 hours 09/16/14 0145     09/16/14 1000  ciprofloxacin (CIPRO) IVPB 400 mg     400 mg 200 mL/hr over 60 Minutes Intravenous Every 12 hours 09/16/14 0831     09/15/14 2300  cefTRIAXone (ROCEPHIN) 1 g in dextrose 5 % 50 mL IVPB     1 g 100 mL/hr over 30 Minutes Intravenous  Once 09/15/14 2253 09/15/14 2354     Assessment: 81yo female with fever  and UTI.  Rocephin started initially and then asked to add Cipro. SCr is at baseline.  Tm 103.  2/2 blood cx's with GNR  Goal of Therapy:  Eradicate infection.  Plan:   Continue Rocephin q24hrs as ordered  Cipro 400mg  IV q12hrs  F/U cultures  Narrow ABX therapy when organism identified  Hart Robinsons A 09/17/2014,11:40 AM

## 2014-09-17 NOTE — Progress Notes (Signed)
Patients family members reported that patient is very agitated right now. I assessed the patient and she is exhibiting facial grimacing and appears very agitated and pulling at her tubes. Paging the on-call MD to make them aware, I will follow any new orders given.

## 2014-09-17 NOTE — Progress Notes (Signed)
Subjective: She looks significantly better this morning. Her temperature has improved. She has positive blood cultures. She is negative for influenza. Her outpatient urine culture has been reported as Escherichia coli but we don't have sensitivities as of yesterday evening.  Objective: Vital signs in last 24 hours: Temp:  [98.3 F (36.8 C)-103 F (39.4 C)] 100.7 F (38.2 C) (02/12 0501) Pulse Rate:  [76-85] 85 (02/12 0501) Resp:  [21-22] 21 (02/12 0501) BP: (96-102)/(47-55) 98/49 mmHg (02/12 0501) SpO2:  [95 %-100 %] 95 % (02/12 0501) Weight change:  Last BM Date: 09/14/14  Intake/Output from previous day: 02/11 0701 - 02/12 0700 In: 3158.8 [P.O.:640; I.V.:2068.8; IV Piggyback:450] Out: 2450 [Urine:2450]  PHYSICAL EXAM General appearance: alert and Confused Resp: clear to auscultation bilaterally Cardio: regular rate and rhythm, S1, S2 normal, no murmur, click, rub or gallop GI: soft, non-tender; bowel sounds normal; no masses,  no organomegaly Extremities: extremities normal, atraumatic, no cyanosis or edema  Lab Results:  Results for orders placed or performed during the hospital encounter of 09/15/14 (from the past 48 hour(s))  Hemoglobin A1c     Status: Abnormal   Collection Time: 09/15/14 10:10 PM  Result Value Ref Range   Hgb A1c MFr Bld 6.2 (H) 4.8 - 5.6 %    Comment: (NOTE)         Pre-diabetes: 5.7 - 6.4         Diabetes: >6.4         Glycemic control for adults with diabetes: <7.0    Mean Plasma Glucose 131 mg/dL    Comment: (NOTE) Performed At: [facility] [place], [place] [id number] Lindon Romp MD JK:[id number]   Urinalysis, Routine w reflex microscopic     Status: Abnormal   Collection Time: 09/15/14 10:15 PM  Result Value Ref Range   Color, Urine YELLOW YELLOW   APPearance HAZY (A) CLEAR   Specific Gravity, Urine 1.015 1.005 - 1.030   pH 6.5 5.0 - 8.0   Glucose, UA NEGATIVE NEGATIVE mg/dL   Hgb urine dipstick TRACE (A)  NEGATIVE   Bilirubin Urine NEGATIVE NEGATIVE   Ketones, ur 15 (A) NEGATIVE mg/dL   Protein, ur TRACE (A) NEGATIVE mg/dL   Urobilinogen, UA 0.2 0.0 - 1.0 mg/dL   Nitrite POSITIVE (A) NEGATIVE   Leukocytes, UA SMALL (A) NEGATIVE  Urine microscopic-add on     Status: Abnormal   Collection Time: 09/15/14 10:15 PM  Result Value Ref Range   Squamous Epithelial / LPF FEW (A) RARE   WBC, UA TOO NUMEROUS TO COUNT <3 WBC/hpf   RBC / HPF 3-6 <3 RBC/hpf   Bacteria, UA MANY (A) RARE  CBC with Differential     Status: Abnormal   Collection Time: 09/15/14 10:17 PM  Result Value Ref Range   WBC 8.7 4.0 - 10.5 K/uL   RBC 3.95 3.87 - 5.11 MIL/uL   Hemoglobin 12.1 12.0 - 15.0 g/dL   HCT 34.8 (L) 36.0 - 46.0 %   MCV 88.1 78.0 - 100.0 fL   MCH 30.6 26.0 - 34.0 pg   MCHC 34.8 30.0 - 36.0 g/dL   RDW 13.9 11.5 - 15.5 %   Platelets 127 (L) 150 - 400 K/uL   Neutrophils Relative % 88 (H) 43 - 77 %   Neutro Abs 7.7 1.7 - 7.7 K/uL   Lymphocytes Relative 6 (L) 12 - 46 %   Lymphs Abs 0.5 (L) 0.7 - 4.0 K/uL   Monocytes Relative 6 3 -  12 %   Monocytes Absolute 0.5 0.1 - 1.0 K/uL   Eosinophils Relative 0 0 - 5 %   Eosinophils Absolute 0.0 0.0 - 0.7 K/uL   Basophils Relative 0 0 - 1 %   Basophils Absolute 0.0 0.0 - 0.1 K/uL  Comprehensive metabolic panel     Status: Abnormal   Collection Time: 09/15/14 10:17 PM  Result Value Ref Range   Sodium 128 (L) 135 - 145 mmol/L   Potassium 3.5 3.5 - 5.1 mmol/L   Chloride 96 96 - 112 mmol/L   CO2 25 19 - 32 mmol/L   Glucose, Bld 188 (H) 70 - 99 mg/dL   BUN 16 6 - 23 mg/dL   Creatinine, Ser 0.92 0.50 - 1.10 mg/dL   Calcium 8.6 8.4 - 10.5 mg/dL   Total Protein 6.9 6.0 - 8.3 g/dL   Albumin 3.5 3.5 - 5.2 g/dL   AST 35 0 - 37 U/L   ALT 32 0 - 35 U/L   Alkaline Phosphatase 76 39 - 117 U/L   Total Bilirubin 1.3 (H) 0.3 - 1.2 mg/dL   GFR calc non Af Amer 56 (L) >90 mL/min   GFR calc Af Amer 65 (L) >90 mL/min    Comment: (NOTE) The eGFR has been calculated using  the CKD EPI equation. This calculation has not been validated in all clinical situations. eGFR's persistently <90 mL/min signify possible Chronic Kidney Disease.    Anion gap 7 5 - 15  Blood culture (routine x 2)     Status: None (Preliminary result)   Collection Time: 09/15/14 10:24 PM  Result Value Ref Range   Specimen Description RIGHT ANTECUBITAL    Special Requests BOTTLES DRAWN AEROBIC AND ANAEROBIC 6CC EACH    Culture      GRAM NEGATIVE RODS Note: Gram Stain Report Called to,Read Back By and Verified With: SANTOS K 1258 09/16/14 BY Tyrone Schimke M Performed at [facility] Performed at [facility]    Report Status PENDING   Blood culture (routine x 2)     Status: None (Preliminary result)   Collection Time: 09/15/14 10:24 PM  Result Value Ref Range   Specimen Description BLOOD LEFT HAND    Special Requests BOTTLES DRAWN AEROBIC AND ANAEROBIC 6CC EACH    Culture      GRAM NEGATIVE RODS Note: Gram Stain Report Called to,Read Back By and Verified With: SANTOS K 12:58 09/16/14 BY Tyrone Schimke M Performed at [facility] Performed at [facility]    Report Status PENDING   I-Stat CG4 Lactic Acid, ED     Status: None   Collection Time: 09/15/14 10:27 PM  Result Value Ref Range   Lactic Acid, Venous 1.10 0.5 - 2.0 mmol/L  Osmolality     Status: Abnormal   Collection Time: 09/15/14 11:00 PM  Result Value Ref Range   Osmolality 205 (L) 275 - 300 mOsm/kg    Comment: Performed at [facility]  CBC     Status: Abnormal   Collection Time: 09/16/14  6:46 AM  Result Value Ref Range   WBC 7.2 4.0 - 10.5 K/uL   RBC 3.51 (L) 3.87 - 5.11 MIL/uL   Hemoglobin 10.6 (L) 12.0 - 15.0 g/dL   HCT 31.2 (L) 36.0 - 46.0 %   MCV 88.9 78.0 - 100.0 fL   MCH 30.2 26.0 - 34.0 pg   MCHC 34.0 30.0 - 36.0 g/dL   RDW 13.8 11.5 - 15.5 %   Platelets  116 (L) 150 - 400 K/uL    Comment: SPECIMEN CHECKED FOR CLOTS CONSISTENT WITH PREVIOUS RESULT   Comprehensive metabolic panel      Status: Abnormal   Collection Time: 09/16/14  6:46 AM  Result Value Ref Range   Sodium 131 (L) 135 - 145 mmol/L   Potassium 3.6 3.5 - 5.1 mmol/L   Chloride 104 96 - 112 mmol/L    Comment: DELTA CHECK NOTED   CO2 22 19 - 32 mmol/L   Glucose, Bld 173 (H) 70 - 99 mg/dL   BUN 14 6 - 23 mg/dL   Creatinine, Ser 0.84 0.50 - 1.10 mg/dL   Calcium 7.8 (L) 8.4 - 10.5 mg/dL   Total Protein 5.7 (L) 6.0 - 8.3 g/dL   Albumin 2.8 (L) 3.5 - 5.2 g/dL   AST 40 (H) 0 - 37 U/L   ALT 39 (H) 0 - 35 U/L   Alkaline Phosphatase 65 39 - 117 U/L   Total Bilirubin 1.1 0.3 - 1.2 mg/dL   GFR calc non Af Amer 63 (L) >90 mL/min   GFR calc Af Amer 73 (L) >90 mL/min    Comment: (NOTE) The eGFR has been calculated using the CKD EPI equation. This calculation has not been validated in all clinical situations. eGFR's persistently <90 mL/min signify possible Chronic Kidney Disease.    Anion gap 5 5 - 15  Glucose, capillary     Status: Abnormal   Collection Time: 09/16/14  8:01 AM  Result Value Ref Range   Glucose-Capillary 163 (H) 70 - 99 mg/dL  Influenza panel by PCR (type A & B, H1N1)     Status: None   Collection Time: 09/16/14  8:32 AM  Result Value Ref Range   Influenza A By PCR NEGATIVE NEGATIVE   Influenza B By PCR NEGATIVE NEGATIVE   H1N1 flu by pcr NOT DETECTED NOT DETECTED    Comment:        The Xpert Flu assay (FDA approved for nasal aspirates or washes and nasopharyngeal swab specimens), is intended as an aid in the diagnosis of influenza and should not be used as a sole basis for treatment.   Glucose, capillary     Status: Abnormal   Collection Time: 09/16/14 11:12 AM  Result Value Ref Range   Glucose-Capillary 147 (H) 70 - 99 mg/dL  Glucose, capillary     Status: Abnormal   Collection Time: 09/16/14  4:50 PM  Result Value Ref Range   Glucose-Capillary 109 (H) 70 - 99 mg/dL  CBC with Differential/Platelet     Status: Abnormal   Collection Time: 09/17/14  6:16 AM  Result Value Ref  Range   WBC 6.8 4.0 - 10.5 K/uL   RBC 3.52 (L) 3.87 - 5.11 MIL/uL   Hemoglobin 10.5 (L) 12.0 - 15.0 g/dL   HCT 31.1 (L) 36.0 - 46.0 %   MCV 88.4 78.0 - 100.0 fL   MCH 29.8 26.0 - 34.0 pg   MCHC 33.8 30.0 - 36.0 g/dL   RDW 13.6 11.5 - 15.5 %   Platelets 103 (L) 150 - 400 K/uL    Comment: SPECIMEN CHECKED FOR CLOTS CONSISTENT WITH PREVIOUS RESULT    Neutrophils Relative % 75 43 - 77 %   Neutro Abs 5.1 1.7 - 7.7 K/uL   Lymphocytes Relative 15 12 - 46 %   Lymphs Abs 1.0 0.7 - 4.0 K/uL   Monocytes Relative 10 3 - 12 %   Monocytes Absolute 0.7 0.1 - 1.0  K/uL   Eosinophils Relative 0 0 - 5 %   Eosinophils Absolute 0.0 0.0 - 0.7 K/uL   Basophils Relative 0 0 - 1 %   Basophils Absolute 0.0 0.0 - 0.1 K/uL  Basic metabolic panel     Status: Abnormal   Collection Time: 09/17/14  6:16 AM  Result Value Ref Range   Sodium 133 (L) 135 - 145 mmol/L   Potassium 3.2 (L) 3.5 - 5.1 mmol/L   Chloride 97 96 - 112 mmol/L   CO2 26 19 - 32 mmol/L   Glucose, Bld 154 (H) 70 - 99 mg/dL   BUN 10 6 - 23 mg/dL   Creatinine, Ser 0.96 0.50 - 1.10 mg/dL   Calcium 8.2 (L) 8.4 - 10.5 mg/dL   GFR calc non Af Amer 54 (L) >90 mL/min   GFR calc Af Amer 62 (L) >90 mL/min    Comment: (NOTE) The eGFR has been calculated using the CKD EPI equation. This calculation has not been validated in all clinical situations. eGFR's persistently <90 mL/min signify possible Chronic Kidney Disease.    Anion gap 10 5 - 15    ABGS No results for input(s): PHART, PO2ART, TCO2, HCO3 in the last 72 hours.  Invalid input(s): PCO2 CULTURES Recent Results (from the past 240 hour(s))  Blood culture (routine x 2)     Status: None (Preliminary result)   Collection Time: 09/15/14 10:24 PM  Result Value Ref Range Status   Specimen Description RIGHT ANTECUBITAL  Final   Special Requests BOTTLES DRAWN AEROBIC AND ANAEROBIC [facility] EACH  Final   Culture   Final    GRAM NEGATIVE RODS Note: Gram Stain Report Called to,Read Back By and  Verified With: SANTOS K 1258 09/16/14 BY Tyrone Schimke M Performed at [facility] Performed at [facility]    Report Status PENDING  Incomplete  Blood culture (routine x 2)     Status: None (Preliminary result)   Collection Time: 09/15/14 10:24 PM  Result Value Ref Range Status   Specimen Description BLOOD LEFT HAND  Final   Special Requests BOTTLES DRAWN AEROBIC AND ANAEROBIC [facility] EACH  Final   Culture   Final    GRAM NEGATIVE RODS Note: Gram Stain Report Called to,Read Back By and Verified With: SANTOS K 12:58 09/16/14 BY Elza Rafter Performed at [facility] Performed at [facility]    Report Status PENDING  Incomplete   Studies/Results: No results found.  Medications:  Prior to Admission:  Prescriptions prior to admission  Medication Sig Dispense Refill Last Dose  . allopurinol (ZYLOPRIM) 100 MG tablet TAKE ONE TABLET BY MOUTH ONCE DAILY. 30 tablet 9 09/15/2014 at Unknown time  . amitriptyline (ELAVIL) 75 MG tablet TAKE (1) TABLET BY MOUTH AT BEDTIME. 30 tablet 5 09/14/2014 at Unknown time  . atorvastatin (LIPITOR) 20 MG tablet TAKE (1) TABLET BY MOUTH AT BEDTIME. 30 tablet 9 09/14/2014 at Unknown time  . citalopram (CELEXA) 10 MG tablet TAKE ONE TABLET BY MOUTH ONCE DAILY. 30 tablet 9 09/15/2014 at Unknown time  . donepezil (ARICEPT) 10 MG tablet Take 10 mg by mouth at bedtime.   09/15/2014 at Unknown time  . Lactobacillus (ACIDOPHILUS) TABS TAKE ONE TABLET BY MOUTH ONCE DAILY. 30 each 5 09/15/2014 at Unknown time  . Lactobacillus (ACIDOPHILUS) TABS Take 1 tablet by mouth daily.   09/15/2014 at Unknown time  . levothyroxine (SYNTHROID, LEVOTHROID) 112 MCG tablet Take 1 tablet (112 mcg total) by mouth daily. 30 tablet  3 09/15/2014 at Unknown time  . linagliptin (TRADJENTA) 5 MG TABS tablet Take 5 mg by mouth daily.   09/15/2014 at Unknown time  . LORazepam (ATIVAN) 2 MG tablet Take 1 mg by mouth 5 (five) times daily. As needed for anxiety   09/14/2014 at Unknown time  .  losartan-hydrochlorothiazide (HYZAAR) 100-12.5 MG per tablet TAKE ONE TABLET BY MOUTH ONCE DAILY. 30 tablet 9 09/15/2014 at Unknown time  . Multiple Vitamins-Minerals (PRESERVISION/LUTEIN PO) Take 1 tablet by mouth 2 (two) times daily.    09/15/2014 at Unknown time  . NAMENDA 10 MG tablet TAKE (1) TABLET BY MOUTH TWICE DAILY. 60 tablet 9 09/15/2014 at Unknown time  . traZODone (DESYREL) 50 MG tablet Take 25 mg by mouth at bedtime.   09/15/2014 at Unknown time  . acetaminophen (TYLENOL) 500 MG tablet Take 500-1,000 mg by mouth every 6 (six) hours as needed (pain(Max 5 tablets per 24hours)).    unknown  . ALPRAZolam (XANAX) 0.25 MG tablet TAKE 1 TABLET BY MOUTH TWICE DAILY AT 8AM AND 2PM. (Patient not taking: Reported on 09/15/2014) 60 tablet 5 Taking  . nystatin-triamcinolone (MYCOLOG II) cream Apply 1 application topically 2 (two) times daily as needed (rash).    unknown   Scheduled: . amitriptyline  75 mg Oral QHS  . atorvastatin  20 mg Oral q1800  . cefTRIAXone (ROCEPHIN)  IV  1 g Intravenous Q24H  . ciprofloxacin  400 mg Intravenous Q12H  . citalopram  10 mg Oral Daily  . donepezil  10 mg Oral QHS  . enoxaparin (LOVENOX) injection  40 mg Subcutaneous Q24H  . insulin aspart  0-9 Units Subcutaneous TID WC  . levothyroxine  112 mcg Oral QAC breakfast  . memantine  10 mg Oral BID  . potassium chloride  20 mEq Oral BID  . traZODone  25 mg Oral QHS   Continuous: . sodium chloride 75 mL/hr at 09/16/14 2213   TGG:YIRSWNIOEVOJJ **OR** acetaminophen, LORazepam, ondansetron **OR** ondansetron (ZOFRAN) IV  Assesment: She was admitted with urinary tract infection. She has bacteremia. She is significantly better. Principal Problem:   UTI (lower urinary tract infection) Active Problems:   Hypothyroidism   Essential hypertension   CARDIAC MURMUR   Alzheimer's disease    Plan: Continue current treatments for now and perhaps I can adjust her antibiotics once I get sensitivities    LOS: 1 day    Mahogany Torrance L 09/17/2014, 8:20 AM

## 2014-09-18 LAB — GLUCOSE, CAPILLARY
GLUCOSE-CAPILLARY: 115 mg/dL — AB (ref 70–99)
Glucose-Capillary: 146 mg/dL — ABNORMAL HIGH (ref 70–99)
Glucose-Capillary: 182 mg/dL — ABNORMAL HIGH (ref 70–99)
Glucose-Capillary: 172 mg/dL — ABNORMAL HIGH (ref 70–99)

## 2014-09-18 LAB — URINE CULTURE
Colony Count: NO GROWTH
Culture: NO GROWTH

## 2014-09-18 NOTE — Progress Notes (Signed)
Upon assessment, continuous temp monitor showed temp of 98.56F. Cooling blanket turned off with temperature probe still in place. Will continue to monitor patient. Per report from RNs during last two shifts, Rise in temperature came post-Ciprofloxacin infusion. Will confer with Dr. Luan Pulling this morning. Patient up in chair resting comfortably.

## 2014-09-18 NOTE — Progress Notes (Signed)
Subjective: She is more alert. She looks better in general. She still had some fever last night. She is eating better. Her urine culture from my office shows Escherichia coli and it is sensitive to Rocephin. There has been some concern that she may be having a febrile response to Cipro.  Objective: Vital signs in last 24 hours: Temp:  [97.4 F (36.3 C)-100.8 F (38.2 C)] 98.3 F (36.8 C) (02/13 0753) Pulse Rate:  [66-71] 71 (02/13 0534) Resp:  [20] 20 (02/13 0534) BP: (126-160)/(70-90) 126/85 mmHg (02/13 0534) SpO2:  [92 %-99 %] 99 % (02/13 0534) Weight change:  Last BM Date: 09/17/14  Intake/Output from previous day: 02/12 0701 - 02/13 0700 In: 2160 [I.V.:1710; IV Piggyback:450] Out: 5250 [Urine:5250]  PHYSICAL EXAM General appearance: alert and no distress Resp: clear to auscultation bilaterally Cardio: regular rate and rhythm, S1, S2 normal, no murmur, click, rub or gallop GI: soft, non-tender; bowel sounds normal; no masses,  no organomegaly Extremities: extremities normal, atraumatic, no cyanosis or edema  Lab Results:  Results for orders placed or performed during the hospital encounter of 09/15/14 (from the past 48 hour(s))  Glucose, capillary     Status: Abnormal   Collection Time: 09/16/14 11:12 AM  Result Value Ref Range   Glucose-Capillary 147 (H) 70 - 99 mg/dL  Glucose, capillary     Status: Abnormal   Collection Time: 09/16/14  4:50 PM  Result Value Ref Range   Glucose-Capillary 109 (H) 70 - 99 mg/dL  Urine culture     Status: None   Collection Time: 09/16/14  6:17 PM  Result Value Ref Range   Specimen Description URINE, CLEAN CATCH    Special Requests NONE    Colony Count NO GROWTH Performed at [facility]     Culture NO GROWTH Performed at [facility]     Report Status 09/18/2014 FINAL   Glucose, capillary     Status: Abnormal   Collection Time: 09/16/14  9:15 PM  Result Value Ref Range   Glucose-Capillary 191 (H) 70 - 99 mg/dL   Comment 1 Notify RN    Comment 2 Document in Chart   CBC with Differential/Platelet     Status: Abnormal   Collection Time: 09/17/14  6:16 AM  Result Value Ref Range   WBC 6.8 4.0 - 10.5 K/uL   RBC 3.52 (L) 3.87 - 5.11 MIL/uL   Hemoglobin 10.5 (L) 12.0 - 15.0 g/dL   HCT 31.1 (L) 36.0 - 46.0 %   MCV 88.4 78.0 - 100.0 fL   MCH 29.8 26.0 - 34.0 pg   MCHC 33.8 30.0 - 36.0 g/dL   RDW 13.6 11.5 - 15.5 %   Platelets 103 (L) 150 - 400 K/uL    Comment: SPECIMEN CHECKED FOR CLOTS CONSISTENT WITH PREVIOUS RESULT    Neutrophils Relative % 75 43 - 77 %   Neutro Abs 5.1 1.7 - 7.7 K/uL   Lymphocytes Relative 15 12 - 46 %   Lymphs Abs 1.0 0.7 - 4.0 K/uL   Monocytes Relative 10 3 - 12 %   Monocytes Absolute 0.7 0.1 - 1.0 K/uL   Eosinophils Relative 0 0 - 5 %   Eosinophils Absolute 0.0 0.0 - 0.7 K/uL   Basophils Relative 0 0 - 1 %   Basophils Absolute 0.0 0.0 - 0.1 K/uL  Basic metabolic panel     Status: Abnormal   Collection Time: 09/17/14  6:16 AM  Result Value Ref Range   Sodium 133 (  L) 135 - 145 mmol/L   Potassium 3.2 (L) 3.5 - 5.1 mmol/L   Chloride 97 96 - 112 mmol/L   CO2 26 19 - 32 mmol/L   Glucose, Bld 154 (H) 70 - 99 mg/dL   BUN 10 6 - 23 mg/dL   Creatinine, Ser 0.96 0.50 - 1.10 mg/dL   Calcium 8.2 (L) 8.4 - 10.5 mg/dL   GFR calc non Af Amer 54 (L) >90 mL/min   GFR calc Af Amer 62 (L) >90 mL/min    Comment: (NOTE) The eGFR has been calculated using the CKD EPI equation. This calculation has not been validated in all clinical situations. eGFR's persistently <90 mL/min signify possible Chronic Kidney Disease.    Anion gap 10 5 - 15  Glucose, capillary     Status: Abnormal   Collection Time: 09/17/14  7:41 AM  Result Value Ref Range   Glucose-Capillary 130 (H) 70 - 99 mg/dL   Comment 1 Notify RN   Glucose, capillary     Status: Abnormal   Collection Time: 09/17/14 11:41 AM  Result Value Ref Range   Glucose-Capillary 204 (H) 70 - 99 mg/dL   Comment 1 Notify RN   Glucose,  capillary     Status: None   Collection Time: 09/17/14  4:58 PM  Result Value Ref Range   Glucose-Capillary 86 70 - 99 mg/dL   Comment 1 Notify RN    Comment 2 Document in Chart   Glucose, capillary     Status: Abnormal   Collection Time: 09/17/14  8:25 PM  Result Value Ref Range   Glucose-Capillary 154 (H) 70 - 99 mg/dL    ABGS No results for input(s): PHART, PO2ART, TCO2, HCO3 in the last 72 hours.  Invalid input(s): PCO2 CULTURES Recent Results (from the past 240 hour(s))  Blood culture (routine x 2)     Status: None (Preliminary result)   Collection Time: 09/15/14 10:24 PM  Result Value Ref Range Status   Specimen Description RIGHT ANTECUBITAL  Final   Special Requests BOTTLES DRAWN AEROBIC AND ANAEROBIC [facility] EACH  Final   Culture   Final    GRAM NEGATIVE RODS Note: Gram Stain Report Called to,Read Back By and Verified With: SANTOS K 1258 09/16/14 BY Tyrone Schimke M Performed at [facility] Performed at [facility]    Report Status PENDING  Incomplete  Blood culture (routine x 2)     Status: None (Preliminary result)   Collection Time: 09/15/14 10:24 PM  Result Value Ref Range Status   Specimen Description BLOOD LEFT HAND  Final   Special Requests BOTTLES DRAWN AEROBIC AND ANAEROBIC [facility] EACH  Final   Culture   Final    GRAM NEGATIVE RODS Note: Gram Stain Report Called to,Read Back By and Verified With: SANTOS K 12:58 09/16/14 BY Elza Rafter Performed at [facility] Performed at [facility]    Report Status PENDING  Incomplete  Urine culture     Status: None   Collection Time: 09/16/14  6:17 PM  Result Value Ref Range Status   Specimen Description URINE, CLEAN CATCH  Final   Special Requests NONE  Final   Colony Count NO GROWTH Performed at [facility]   Final   Culture NO GROWTH Performed at [facility]   Final   Report Status 09/18/2014 FINAL  Final   Studies/Results: No results found.  Medications:  Prior to  Admission:  Prescriptions prior to admission  Medication Sig Dispense  Refill Last Dose  . allopurinol (ZYLOPRIM) 100 MG tablet TAKE ONE TABLET BY MOUTH ONCE DAILY. 30 tablet 9 09/15/2014 at Unknown time  . amitriptyline (ELAVIL) 75 MG tablet TAKE (1) TABLET BY MOUTH AT BEDTIME. 30 tablet 5 09/14/2014 at Unknown time  . atorvastatin (LIPITOR) 20 MG tablet TAKE (1) TABLET BY MOUTH AT BEDTIME. 30 tablet 9 09/14/2014 at Unknown time  . citalopram (CELEXA) 10 MG tablet TAKE ONE TABLET BY MOUTH ONCE DAILY. 30 tablet 9 09/15/2014 at Unknown time  . donepezil (ARICEPT) 10 MG tablet Take 10 mg by mouth at bedtime.   09/15/2014 at Unknown time  . Lactobacillus (ACIDOPHILUS) TABS TAKE ONE TABLET BY MOUTH ONCE DAILY. 30 each 5 09/15/2014 at Unknown time  . Lactobacillus (ACIDOPHILUS) TABS Take 1 tablet by mouth daily.   09/15/2014 at Unknown time  . levothyroxine (SYNTHROID, LEVOTHROID) 112 MCG tablet Take 1 tablet (112 mcg total) by mouth daily. 30 tablet 3 09/15/2014 at Unknown time  . linagliptin (TRADJENTA) 5 MG TABS tablet Take 5 mg by mouth daily.   09/15/2014 at Unknown time  . LORazepam (ATIVAN) 2 MG tablet Take 1 mg by mouth 5 (five) times daily. As needed for anxiety   09/14/2014 at Unknown time  . losartan-hydrochlorothiazide (HYZAAR) 100-12.5 MG per tablet TAKE ONE TABLET BY MOUTH ONCE DAILY. 30 tablet 9 09/15/2014 at Unknown time  . Multiple Vitamins-Minerals (PRESERVISION/LUTEIN PO) Take 1 tablet by mouth 2 (two) times daily.    09/15/2014 at Unknown time  . NAMENDA 10 MG tablet TAKE (1) TABLET BY MOUTH TWICE DAILY. 60 tablet 9 09/15/2014 at Unknown time  . traZODone (DESYREL) 50 MG tablet Take 25 mg by mouth at bedtime.   09/15/2014 at Unknown time  . acetaminophen (TYLENOL) 500 MG tablet Take 500-1,000 mg by mouth every 6 (six) hours as needed (pain(Max 5 tablets per 24hours)).    unknown  . ALPRAZolam (XANAX) 0.25 MG tablet TAKE 1 TABLET BY MOUTH TWICE DAILY AT 8AM AND 2PM. (Patient not taking: Reported on  09/15/2014) 60 tablet 5 Taking  . nystatin-triamcinolone (MYCOLOG II) cream Apply 1 application topically 2 (two) times daily as needed (rash).    unknown   Scheduled: . amitriptyline  75 mg Oral QHS  . atorvastatin  20 mg Oral q1800  . cefTRIAXone (ROCEPHIN)  IV  1 g Intravenous Q24H  . citalopram  10 mg Oral Daily  . donepezil  10 mg Oral QHS  . enoxaparin (LOVENOX) injection  40 mg Subcutaneous Q24H  . insulin aspart  0-9 Units Subcutaneous TID WC  . levothyroxine  112 mcg Oral QAC breakfast  . memantine  10 mg Oral BID  . potassium chloride  20 mEq Oral BID  . traZODone  25 mg Oral QHS   Continuous: . sodium chloride 75 mL/hr at 09/17/14 1737   LGX:[id number] **OR** acetaminophen, LORazepam, LORazepam, ondansetron **OR** ondansetron (ZOFRAN) IV  Assesment: She is admitted with urinary tract infection with sepsis. She has bacteremia with positive blood cultures. Her urine culture here was negative but in my office that showed Escherichia coli. Principal Problem:   UTI (lower urinary tract infection) Active Problems:   Hypothyroidism   Essential hypertension   CARDIAC MURMUR   Alzheimer's disease   Bacteremia   Sepsis    Plan: Discontinue Cipro. Try to get her up and moving around some more. Probably decrease IV fluids tomorrow    LOS: 2 days   Toniann Dickerson L 09/18/2014, 10:12 AM

## 2014-09-19 LAB — GLUCOSE, CAPILLARY
GLUCOSE-CAPILLARY: 144 mg/dL — AB (ref 70–99)
GLUCOSE-CAPILLARY: 124 mg/dL — AB (ref 70–99)
GLUCOSE-CAPILLARY: 100 mg/dL — AB (ref 70–99)
Glucose-Capillary: 136 mg/dL — ABNORMAL HIGH (ref 70–99)

## 2014-09-19 LAB — BASIC METABOLIC PANEL
ANION GAP: 5 (ref 5–15)
BUN: 11 mg/dL (ref 6–23)
CALCIUM: 8.2 mg/dL — AB (ref 8.4–10.5)
CO2: 25 mmol/L (ref 19–32)
CREATININE: 0.78 mg/dL (ref 0.50–1.10)
Chloride: 109 mmol/L (ref 96–112)
GFR calc Af Amer: 88 mL/min — ABNORMAL LOW (ref >90)
GFR, EST NON AFRICAN AMERICAN: 76 mL/min — AB (ref >90)
Glucose, Bld: 155 mg/dL — ABNORMAL HIGH (ref 70–99)
POTASSIUM: 3.7 mmol/L (ref 3.5–5.1)
Sodium: 139 mmol/L (ref 135–145)

## 2014-09-19 LAB — CBC WITH DIFFERENTIAL/PLATELET
Basophils Absolute: 0 10*3/uL (ref 0.0–0.1)
Basophils Relative: 0 % (ref 0–1)
EOS ABS: 0.1 10*3/uL (ref 0.0–0.7)
Eosinophils Relative: 1 % (ref 0–5)
HEMATOCRIT: 29.9 % — AB (ref 36.0–46.0)
Hemoglobin: 10.1 g/dL — ABNORMAL LOW (ref 12.0–15.0)
Lymphocytes Relative: 27 % (ref 12–46)
Lymphs Abs: 1.6 10*3/uL (ref 0.7–4.0)
MCH: 30 pg (ref 26.0–34.0)
MCHC: 33.8 g/dL (ref 30.0–36.0)
MCV: 88.7 fL (ref 78.0–100.0)
Monocytes Absolute: 0.7 10*3/uL (ref 0.1–1.0)
Monocytes Relative: 12 % (ref 3–12)
NEUTROS ABS: 3.4 10*3/uL (ref 1.7–7.7)
NEUTROS PCT: 60 % (ref 43–77)
Platelets: 139 10*3/uL — ABNORMAL LOW (ref 150–400)
RBC: 3.37 MIL/uL — ABNORMAL LOW (ref 3.87–5.11)
RDW: 13.9 % (ref 11.5–15.5)
WBC: 5.7 10*3/uL (ref 4.0–10.5)

## 2014-09-19 LAB — CULTURE, BLOOD (ROUTINE X 2)

## 2014-09-19 MED ORDER — GABAPENTIN 100 MG PO CAPS
100.0000 mg | ORAL_CAPSULE | Freq: Every day | ORAL | Status: DC
  Administered 2014-09-19: 100 mg via ORAL
  Filled 2014-09-19: qty 1

## 2014-09-19 NOTE — Progress Notes (Signed)
Family has been very involved and concerned about patient's care. Yesterday, 09/18/2014, family expressed desire to see patient up and walking throughout the day. As documented, patient got up and walked in the room yesterday morning, but asked to return to the chair before entering the hall because she felt dizzy. Patient was also assisted in ambulating in hall before supper, which she tolerated well. Nursing staff offered to ambulate patient again after supper, but family stated patient was feeling agitated. Ativan was given for agitation upon request, and patient assisted back to bed.   Upon returning this morning, family expressed concern that patient did not sleep well through the night and inquired whether or not patient had received medication to help her sleep at night. Nursing staff confirmed that she did receive said medications. Patient was drowsy throughout the morning, perhaps from not sleeping well during the night. After lunch, family requested that patient sit up in the chair, and we assisted her to the chair.   Family asked if patient could walk to the bathroom for toileting. Nursing staff assisted patient to the bathroom. Patient tolerated ambulation fair. RN explained to family that we can certainly assist patient with walking in room and hall; however, for safety reasons it would be wise for patient to use the [facility] in cases of urgency as patient is a high fall risk with history of incontinence. Family expressed more concern, but understanding.   Family showed concern about patient's IV, so RN assessed site and obtained second opinion from another RN. IV was not found to be infiltrated but had some leaking, so IV was removed and new peripheral IV inserted. Patient tolerated procedure well.   Nursing staff was able to sit down with family and discuss their concerns and worries about patient's condition. Rn expressed appreciation for their concern and support in caring for the patient. Will  continue to monitor patient and attend to needs of both patient and family.

## 2014-09-19 NOTE — Progress Notes (Signed)
Subjective: She was up and agitated most of the night last night. She is better this morning but sleepy.  Objective: Vital signs in last 24 hours: Temp:  [97.9 F (36.6 C)-100.3 F (37.9 C)] 97.9 F (36.6 C) (02/14 0637) Pulse Rate:  [72-77] 75 (02/14 0637) Resp:  [20] 20 (02/14 0637) BP: (115-150)/(64-88) 115/66 mmHg (02/14 0637) SpO2:  [93 %-99 %] 93 % (02/14 0637) Weight change:  Last BM Date: 09/17/14  Intake/Output from previous day: 02/13 0701 - 02/14 0700 In: 1557.5 [P.O.:720; I.V.:787.5; IV Piggyback:50] Out: 1975 [QIWLN:[id number]]  PHYSICAL EXAM General appearance: alert and no distress Resp: clear to auscultation bilaterally Cardio: regular rate and rhythm, S1, S2 normal, no murmur, click, rub or gallop GI: soft, non-tender; bowel sounds normal; no masses,  no organomegaly Extremities: extremities normal, atraumatic, no cyanosis or edema  Lab Results:  Results for orders placed or performed during the hospital encounter of 09/15/14 (from the past 48 hour(s))  Glucose, capillary     Status: Abnormal   Collection Time: 09/17/14 11:41 AM  Result Value Ref Range   Glucose-Capillary 204 (H) 70 - 99 mg/dL   Comment 1 Notify RN   Glucose, capillary     Status: None   Collection Time: 09/17/14  4:58 PM  Result Value Ref Range   Glucose-Capillary 86 70 - 99 mg/dL   Comment 1 Notify RN    Comment 2 Document in Chart   Glucose, capillary     Status: Abnormal   Collection Time: 09/17/14  8:25 PM  Result Value Ref Range   Glucose-Capillary 154 (H) 70 - 99 mg/dL  Glucose, capillary     Status: Abnormal   Collection Time: 09/18/14  7:34 AM  Result Value Ref Range   Glucose-Capillary 146 (H) 70 - 99 mg/dL   Comment 1 Notify RN   Glucose, capillary     Status: Abnormal   Collection Time: 09/18/14 11:34 AM  Result Value Ref Range   Glucose-Capillary 182 (H) 70 - 99 mg/dL   Comment 1 Notify RN   Glucose, capillary     Status: Abnormal   Collection Time: 09/18/14  5:02 PM   Result Value Ref Range   Glucose-Capillary 115 (H) 70 - 99 mg/dL   Comment 1 Notify RN   Glucose, capillary     Status: Abnormal   Collection Time: 09/18/14  9:13 PM  Result Value Ref Range   Glucose-Capillary 172 (H) 70 - 99 mg/dL   Comment 1 Notify RN   Basic metabolic panel     Status: Abnormal   Collection Time: 09/19/14  5:56 AM  Result Value Ref Range   Sodium 139 135 - 145 mmol/L   Potassium 3.7 3.5 - 5.1 mmol/L   Chloride 109 96 - 112 mmol/L    Comment: DELTA CHECK NOTED   CO2 25 19 - 32 mmol/L   Glucose, Bld 155 (H) 70 - 99 mg/dL   BUN 11 6 - 23 mg/dL   Creatinine, Ser 0.78 0.50 - 1.10 mg/dL   Calcium 8.2 (L) 8.4 - 10.5 mg/dL   GFR calc non Af Amer 76 (L) >90 mL/min   GFR calc Af Amer 88 (L) >90 mL/min    Comment: (NOTE) The eGFR has been calculated using the CKD EPI equation. This calculation has not been validated in all clinical situations. eGFR's persistently <90 mL/min signify possible Chronic Kidney Disease.    Anion gap 5 5 - 15  CBC with Differential/Platelet     Status:  Abnormal   Collection Time: 09/19/14  5:56 AM  Result Value Ref Range   WBC 5.7 4.0 - 10.5 K/uL   RBC 3.37 (L) 3.87 - 5.11 MIL/uL   Hemoglobin 10.1 (L) 12.0 - 15.0 g/dL   HCT 29.9 (L) 36.0 - 46.0 %   MCV 88.7 78.0 - 100.0 fL   MCH 30.0 26.0 - 34.0 pg   MCHC 33.8 30.0 - 36.0 g/dL   RDW 13.9 11.5 - 15.5 %   Platelets 139 (L) 150 - 400 K/uL    Comment: DELTA CHECK NOTED RESULT REPEATED AND VERIFIED    Neutrophils Relative % 60 43 - 77 %   Neutro Abs 3.4 1.7 - 7.7 K/uL   Lymphocytes Relative 27 12 - 46 %   Lymphs Abs 1.6 0.7 - 4.0 K/uL   Monocytes Relative 12 3 - 12 %   Monocytes Absolute 0.7 0.1 - 1.0 K/uL   Eosinophils Relative 1 0 - 5 %   Eosinophils Absolute 0.1 0.0 - 0.7 K/uL   Basophils Relative 0 0 - 1 %   Basophils Absolute 0.0 0.0 - 0.1 K/uL  Glucose, capillary     Status: Abnormal   Collection Time: 09/19/14  8:38 AM  Result Value Ref Range   Glucose-Capillary 144 (H)  70 - 99 mg/dL   Comment 1 Notify RN     ABGS No results for input(s): PHART, PO2ART, TCO2, HCO3 in the last 72 hours.  Invalid input(s): PCO2 CULTURES Recent Results (from the past 240 hour(s))  Blood culture (routine x 2)     Status: None (Preliminary result)   Collection Time: 09/15/14 10:24 PM  Result Value Ref Range Status   Specimen Description RIGHT ANTECUBITAL  Final   Special Requests BOTTLES DRAWN AEROBIC AND ANAEROBIC [facility] EACH  Final   Culture   Final    GRAM NEGATIVE RODS Note: Gram Stain Report Called to,Read Back By and Verified With: SANTOS K 1258 09/16/14 BY Tyrone Schimke M Performed at [facility] Performed at [facility]    Report Status PENDING  Incomplete  Blood culture (routine x 2)     Status: None (Preliminary result)   Collection Time: 09/15/14 10:24 PM  Result Value Ref Range Status   Specimen Description BLOOD LEFT HAND  Final   Special Requests BOTTLES DRAWN AEROBIC AND ANAEROBIC [facility] EACH  Final   Culture   Final    GRAM NEGATIVE RODS Note: Gram Stain Report Called to,Read Back By and Verified With: SANTOS K 12:58 09/16/14 BY Elza Rafter Performed at [facility] Performed at [facility]    Report Status PENDING  Incomplete  Urine culture     Status: None   Collection Time: 09/16/14  6:17 PM  Result Value Ref Range Status   Specimen Description URINE, CLEAN CATCH  Final   Special Requests NONE  Final   Colony Count NO GROWTH Performed at [facility]   Final   Culture NO GROWTH Performed at [facility]   Final   Report Status 09/18/2014 FINAL  Final   Studies/Results: No results found.  Medications:  Prior to Admission:  Prescriptions prior to admission  Medication Sig Dispense Refill Last Dose  . allopurinol (ZYLOPRIM) 100 MG tablet TAKE ONE TABLET BY MOUTH ONCE DAILY. 30 tablet 9 09/15/2014 at Unknown time  . amitriptyline (ELAVIL) 75 MG tablet TAKE (1) TABLET BY MOUTH AT BEDTIME. 30 tablet 5  09/14/2014 at Unknown time  . atorvastatin (LIPITOR) 20  MG tablet TAKE (1) TABLET BY MOUTH AT BEDTIME. 30 tablet 9 09/14/2014 at Unknown time  . citalopram (CELEXA) 10 MG tablet TAKE ONE TABLET BY MOUTH ONCE DAILY. 30 tablet 9 09/15/2014 at Unknown time  . donepezil (ARICEPT) 10 MG tablet Take 10 mg by mouth at bedtime.   09/15/2014 at Unknown time  . Lactobacillus (ACIDOPHILUS) TABS TAKE ONE TABLET BY MOUTH ONCE DAILY. 30 each 5 09/15/2014 at Unknown time  . Lactobacillus (ACIDOPHILUS) TABS Take 1 tablet by mouth daily.   09/15/2014 at Unknown time  . levothyroxine (SYNTHROID, LEVOTHROID) 112 MCG tablet Take 1 tablet (112 mcg total) by mouth daily. 30 tablet 3 09/15/2014 at Unknown time  . linagliptin (TRADJENTA) 5 MG TABS tablet Take 5 mg by mouth daily.   09/15/2014 at Unknown time  . LORazepam (ATIVAN) 2 MG tablet Take 1 mg by mouth 5 (five) times daily. As needed for anxiety   09/14/2014 at Unknown time  . losartan-hydrochlorothiazide (HYZAAR) 100-12.5 MG per tablet TAKE ONE TABLET BY MOUTH ONCE DAILY. 30 tablet 9 09/15/2014 at Unknown time  . Multiple Vitamins-Minerals (PRESERVISION/LUTEIN PO) Take 1 tablet by mouth 2 (two) times daily.    09/15/2014 at Unknown time  . NAMENDA 10 MG tablet TAKE (1) TABLET BY MOUTH TWICE DAILY. 60 tablet 9 09/15/2014 at Unknown time  . traZODone (DESYREL) 50 MG tablet Take 25 mg by mouth at bedtime.   09/15/2014 at Unknown time  . acetaminophen (TYLENOL) 500 MG tablet Take 500-1,000 mg by mouth every 6 (six) hours as needed (pain(Max 5 tablets per 24hours)).    unknown  . ALPRAZolam (XANAX) 0.25 MG tablet TAKE 1 TABLET BY MOUTH TWICE DAILY AT 8AM AND 2PM. (Patient not taking: Reported on 09/15/2014) 60 tablet 5 Taking  . nystatin-triamcinolone (MYCOLOG II) cream Apply 1 application topically 2 (two) times daily as needed (rash).    unknown   Scheduled: . amitriptyline  75 mg Oral QHS  . atorvastatin  20 mg Oral q1800  . cefTRIAXone (ROCEPHIN)  IV  1 g Intravenous Q24H  .  citalopram  10 mg Oral Daily  . donepezil  10 mg Oral QHS  . enoxaparin (LOVENOX) injection  40 mg Subcutaneous Q24H  . insulin aspart  0-9 Units Subcutaneous TID WC  . levothyroxine  112 mcg Oral QAC breakfast  . memantine  10 mg Oral BID  . potassium chloride  20 mEq Oral BID  . traZODone  25 mg Oral QHS   Continuous: . sodium chloride 75 mL/hr at 09/17/14 1737   SAY:[id number] **OR** acetaminophen, LORazepam, LORazepam, ondansetron **OR** ondansetron (ZOFRAN) IV  Assesment: She was admitted with urinary tract infection bacteremia and sepsis. She is improving. At baseline she has dementia. She has trouble with becoming agitated at night and we have tried multiple medications with very little success. She has mild thrombocytopenia and anemia which I am not going to work. Principal Problem:   UTI (lower urinary tract infection) Active Problems:   Hypothyroidism   Essential hypertension   CARDIAC MURMUR   Alzheimer's disease   Bacteremia   Sepsis    Plan: Continue treatments. Try gabapentin for the agitation at night. Remove Foley catheter    LOS: 3 days   Aundraya Dripps L 09/19/2014, 10:11 AM

## 2014-09-20 ENCOUNTER — Inpatient Hospital Stay (HOSPITAL_COMMUNITY): Payer: Medicare Other

## 2014-09-20 LAB — CBC
HCT: 32.2 % — ABNORMAL LOW (ref 36.0–46.0)
Hemoglobin: 10.8 g/dL — ABNORMAL LOW (ref 12.0–15.0)
MCH: 29.9 pg (ref 26.0–34.0)
MCHC: 33.5 g/dL (ref 30.0–36.0)
MCV: 89.2 fL (ref 78.0–100.0)
PLATELETS: 170 10*3/uL (ref 150–400)
RBC: 3.61 MIL/uL — ABNORMAL LOW (ref 3.87–5.11)
RDW: 13.9 % (ref 11.5–15.5)
WBC: 4.7 10*3/uL (ref 4.0–10.5)

## 2014-09-20 LAB — LIPASE, BLOOD: LIPASE: 22 U/L (ref 11–59)

## 2014-09-20 LAB — GLUCOSE, CAPILLARY
GLUCOSE-CAPILLARY: 139 mg/dL — AB (ref 70–99)
Glucose-Capillary: 143 mg/dL — ABNORMAL HIGH (ref 70–99)
Glucose-Capillary: 128 mg/dL — ABNORMAL HIGH (ref 70–99)
Glucose-Capillary: 151 mg/dL — ABNORMAL HIGH (ref 70–99)

## 2014-09-20 MED ORDER — HYDROMORPHONE HCL 1 MG/ML IJ SOLN: 0.5000 mg | INTRAMUSCULAR | Status: DC | PRN

## 2014-09-20 MED ORDER — GABAPENTIN 100 MG PO CAPS: 100.0000 mg | ORAL_CAPSULE | Freq: Every day | ORAL | Status: DC

## 2014-09-20 MED ORDER — CEFUROXIME AXETIL 250 MG PO TABS: 250.0000 mg | ORAL_TABLET | Freq: Two times a day (BID) | ORAL | Status: DC

## 2014-09-20 NOTE — Clinical Social Work Note (Signed)
CSW updated [name] at facility on pt. They have been updated by other family this weekend and today. Plan remains for pt to return to [facility] when medically stable. CSW will continue to follow.   Benay Pike, [place]

## 2014-09-20 NOTE — Progress Notes (Signed)
Subjective: She is sleepy but arousable. She apparently slept most of the day yesterday. She has a chronic situation with being agitated at night. She took gabapentin last night and I'm not sure if the gabapentin has made her sleepy or if she is still simply tired from lack of sleep previously. She has no other new complaints. She has positive blood cultures for Escherichia coli.  Objective: Vital signs in last 24 hours: Temp:  [98.3 F (36.8 C)-98.6 F (37 C)] 98.6 F (37 C) (02/15 0610) Pulse Rate:  [65-71] 65 (02/15 0610) Resp:  [20] 20 (02/15 0610) BP: (137-150)/(71-83) 137/77 mmHg (02/15 0610) SpO2:  [96 %-99 %] 99 % (02/15 0610) Weight change:  Last BM Date: 09/19/14  Intake/Output from previous day: 02/14 0701 - 02/15 0700 In: 1240 [P.O.:440; I.V.:750; IV Piggyback:50] Out: 1350 [Urine:1350]  PHYSICAL EXAM General appearance: She is awake and arousable but generally sleepy Resp: clear to auscultation bilaterally Cardio: regular rate and rhythm, S1, S2 normal, no murmur, click, rub or gallop GI: soft, non-tender; bowel sounds normal; no masses,  no organomegaly Extremities: extremities normal, atraumatic, no cyanosis or edema  Lab Results:  Results for orders placed or performed during the hospital encounter of 09/15/14 (from the past 48 hour(s))  Glucose, capillary     Status: Abnormal   Collection Time: 09/18/14 11:34 AM  Result Value Ref Range   Glucose-Capillary 182 (H) 70 - 99 mg/dL   Comment 1 Notify RN   Glucose, capillary     Status: Abnormal   Collection Time: 09/18/14  5:02 PM  Result Value Ref Range   Glucose-Capillary 115 (H) 70 - 99 mg/dL   Comment 1 Notify RN   Glucose, capillary     Status: Abnormal   Collection Time: 09/18/14  9:13 PM  Result Value Ref Range   Glucose-Capillary 172 (H) 70 - 99 mg/dL   Comment 1 Notify RN   Basic metabolic panel     Status: Abnormal   Collection Time: 09/19/14  5:56 AM  Result Value Ref Range   Sodium 139 135 -  145 mmol/L   Potassium 3.7 3.5 - 5.1 mmol/L   Chloride 109 96 - 112 mmol/L    Comment: DELTA CHECK NOTED   CO2 25 19 - 32 mmol/L   Glucose, Bld 155 (H) 70 - 99 mg/dL   BUN 11 6 - 23 mg/dL   Creatinine, Ser 0.78 0.50 - 1.10 mg/dL   Calcium 8.2 (L) 8.4 - 10.5 mg/dL   GFR calc non Af Amer 76 (L) >90 mL/min   GFR calc Af Amer 88 (L) >90 mL/min    Comment: (NOTE) The eGFR has been calculated using the CKD EPI equation. This calculation has not been validated in all clinical situations. eGFR's persistently <90 mL/min signify possible Chronic Kidney Disease.    Anion gap 5 5 - 15  CBC with Differential/Platelet     Status: Abnormal   Collection Time: 09/19/14  5:56 AM  Result Value Ref Range   WBC 5.7 4.0 - 10.5 K/uL   RBC 3.37 (L) 3.87 - 5.11 MIL/uL   Hemoglobin 10.1 (L) 12.0 - 15.0 g/dL   HCT 29.9 (L) 36.0 - 46.0 %   MCV 88.7 78.0 - 100.0 fL   MCH 30.0 26.0 - 34.0 pg   MCHC 33.8 30.0 - 36.0 g/dL   RDW 13.9 11.5 - 15.5 %   Platelets 139 (L) 150 - 400 K/uL    Comment: DELTA CHECK NOTED RESULT REPEATED AND  VERIFIED    Neutrophils Relative % 60 43 - 77 %   Neutro Abs 3.4 1.7 - 7.7 K/uL   Lymphocytes Relative 27 12 - 46 %   Lymphs Abs 1.6 0.7 - 4.0 K/uL   Monocytes Relative 12 3 - 12 %   Monocytes Absolute 0.7 0.1 - 1.0 K/uL   Eosinophils Relative 1 0 - 5 %   Eosinophils Absolute 0.1 0.0 - 0.7 K/uL   Basophils Relative 0 0 - 1 %   Basophils Absolute 0.0 0.0 - 0.1 K/uL  Glucose, capillary     Status: Abnormal   Collection Time: 09/19/14  8:38 AM  Result Value Ref Range   Glucose-Capillary 144 (H) 70 - 99 mg/dL   Comment 1 Notify RN   Glucose, capillary     Status: Abnormal   Collection Time: 09/19/14 11:46 AM  Result Value Ref Range   Glucose-Capillary 124 (H) 70 - 99 mg/dL   Comment 1 Notify RN   Glucose, capillary     Status: Abnormal   Collection Time: 09/19/14  5:08 PM  Result Value Ref Range   Glucose-Capillary 100 (H) 70 - 99 mg/dL   Comment 1 Notify RN   Glucose,  capillary     Status: Abnormal   Collection Time: 09/19/14  9:23 PM  Result Value Ref Range   Glucose-Capillary 136 (H) 70 - 99 mg/dL  Glucose, capillary     Status: Abnormal   Collection Time: 09/20/14  7:27 AM  Result Value Ref Range   Glucose-Capillary 143 (H) 70 - 99 mg/dL   Comment 1 Notify RN    Comment 2 Document in Chart     ABGS No results for input(s): PHART, PO2ART, TCO2, HCO3 in the last 72 hours.  Invalid input(s): PCO2 CULTURES Recent Results (from the past 240 hour(s))  Blood culture (routine x 2)     Status: None   Collection Time: 09/15/14 10:24 PM  Result Value Ref Range Status   Specimen Description RIGHT ANTECUBITAL  Final   Special Requests BOTTLES DRAWN AEROBIC AND ANAEROBIC [facility] EACH  Final   Culture   Final    ESCHERICHIA COLI Note: Gram Stain Report Called to,Read Back By and Verified With: SANTOS K 1258 09/16/14 BY Tyrone Schimke M Performed at [facility] Performed at [facility]    Report Status 09/19/2014 FINAL  Final   Organism ID, Bacteria ESCHERICHIA COLI  Final      Susceptibility   Escherichia coli - MIC*    AMPICILLIN >=32 RESISTANT Resistant     AMPICILLIN/SULBACTAM >=32 RESISTANT Resistant     CEFAZOLIN <=4 SENSITIVE Sensitive     CEFEPIME <=1 SENSITIVE Sensitive     CEFTAZIDIME <=1 SENSITIVE Sensitive     CEFTRIAXONE <=1 SENSITIVE Sensitive     CIPROFLOXACIN 0.5 SENSITIVE Sensitive     GENTAMICIN <=1 SENSITIVE Sensitive     IMIPENEM <=0.25 SENSITIVE Sensitive     PIP/TAZO <=4 SENSITIVE Sensitive     TOBRAMYCIN <=1 SENSITIVE Sensitive     TRIMETH/SULFA <=20 SENSITIVE Sensitive     * ESCHERICHIA COLI  Blood culture (routine x 2)     Status: None   Collection Time: 09/15/14 10:24 PM  Result Value Ref Range Status   Specimen Description BLOOD LEFT HAND  Final   Special Requests BOTTLES DRAWN AEROBIC AND ANAEROBIC [facility] EACH  Final   Culture   Final    ESCHERICHIA COLI Note: SUSCEPTIBILITIES PERFORMED ON PREVIOUS CULTURE  WITHIN THE LAST 5 DAYS.  Note: Gram Stain Report Called to,Read Back By and Verified With: SANTOS K 12:58 09/16/14 BY Elza Rafter Performed at [facility] Performed at [facility]    Report Status 09/19/2014 FINAL  Final  Urine culture     Status: None   Collection Time: 09/16/14  6:17 PM  Result Value Ref Range Status   Specimen Description URINE, CLEAN CATCH  Final   Special Requests NONE  Final   Colony Count NO GROWTH Performed at [facility]   Final   Culture NO GROWTH Performed at [facility]   Final   Report Status 09/18/2014 FINAL  Final   Studies/Results: No results found.  Medications:  Prior to Admission:  Prescriptions prior to admission  Medication Sig Dispense Refill Last Dose  . allopurinol (ZYLOPRIM) 100 MG tablet TAKE ONE TABLET BY MOUTH ONCE DAILY. 30 tablet 9 09/15/2014 at Unknown time  . amitriptyline (ELAVIL) 75 MG tablet TAKE (1) TABLET BY MOUTH AT BEDTIME. 30 tablet 5 09/14/2014 at Unknown time  . atorvastatin (LIPITOR) 20 MG tablet TAKE (1) TABLET BY MOUTH AT BEDTIME. 30 tablet 9 09/14/2014 at Unknown time  . citalopram (CELEXA) 10 MG tablet TAKE ONE TABLET BY MOUTH ONCE DAILY. 30 tablet 9 09/15/2014 at Unknown time  . donepezil (ARICEPT) 10 MG tablet Take 10 mg by mouth at bedtime.   09/15/2014 at Unknown time  . Lactobacillus (ACIDOPHILUS) TABS TAKE ONE TABLET BY MOUTH ONCE DAILY. 30 each 5 09/15/2014 at Unknown time  . Lactobacillus (ACIDOPHILUS) TABS Take 1 tablet by mouth daily.   09/15/2014 at Unknown time  . levothyroxine (SYNTHROID, LEVOTHROID) 112 MCG tablet Take 1 tablet (112 mcg total) by mouth daily. 30 tablet 3 09/15/2014 at Unknown time  . linagliptin (TRADJENTA) 5 MG TABS tablet Take 5 mg by mouth daily.   09/15/2014 at Unknown time  . LORazepam (ATIVAN) 2 MG tablet Take 1 mg by mouth 5 (five) times daily. As needed for anxiety   09/14/2014 at Unknown time  . losartan-hydrochlorothiazide (HYZAAR) 100-12.5 MG per tablet TAKE  ONE TABLET BY MOUTH ONCE DAILY. 30 tablet 9 09/15/2014 at Unknown time  . Multiple Vitamins-Minerals (PRESERVISION/LUTEIN PO) Take 1 tablet by mouth 2 (two) times daily.    09/15/2014 at Unknown time  . NAMENDA 10 MG tablet TAKE (1) TABLET BY MOUTH TWICE DAILY. 60 tablet 9 09/15/2014 at Unknown time  . traZODone (DESYREL) 50 MG tablet Take 25 mg by mouth at bedtime.   09/15/2014 at Unknown time  . acetaminophen (TYLENOL) 500 MG tablet Take 500-1,000 mg by mouth every 6 (six) hours as needed (pain(Max 5 tablets per 24hours)).    unknown  . ALPRAZolam (XANAX) 0.25 MG tablet TAKE 1 TABLET BY MOUTH TWICE DAILY AT 8AM AND 2PM. (Patient not taking: Reported on 09/15/2014) 60 tablet 5 Taking  . nystatin-triamcinolone (MYCOLOG II) cream Apply 1 application topically 2 (two) times daily as needed (rash).    unknown   Scheduled: . amitriptyline  75 mg Oral QHS  . atorvastatin  20 mg Oral q1800  . cefTRIAXone (ROCEPHIN)  IV  1 g Intravenous Q24H  . citalopram  10 mg Oral Daily  . donepezil  10 mg Oral QHS  . enoxaparin (LOVENOX) injection  40 mg Subcutaneous Q24H  . gabapentin  100 mg Oral QHS  . insulin aspart  0-9 Units Subcutaneous TID WC  . levothyroxine  112 mcg Oral QAC breakfast  . memantine  10 mg Oral BID  . potassium chloride  20 mEq Oral BID  . traZODone  25 mg Oral QHS   Continuous: . sodium chloride 75 mL/hr at 09/19/14 1026   GYB:[id number] **OR** acetaminophen, LORazepam, LORazepam, ondansetron **OR** ondansetron (ZOFRAN) IV  Assesment: She was admitted with urinary tract infection bacteremia and sepsis. She is much better. She is sluggish. Principal Problem:   UTI (lower urinary tract infection) Active Problems:   Hypothyroidism   Essential hypertension   CARDIAC MURMUR   Alzheimer's disease   Bacteremia   Sepsis    Plan: She may be able to go back to her family care home if she gets more alert. We'll reassess later today    LOS: 4 days   Alana Dayton L 09/20/2014,  10:01 AM

## 2014-09-20 NOTE — Progress Notes (Signed)
Patient ambulated to bathroom and up to chair at this time. Voiding well. Pain is relieved.

## 2014-09-20 NOTE — Progress Notes (Signed)
Patient went down for abdominal xray. Returned and bed was saturated with urine. After cleaning patient up she stated she needed to urinate. Patient assisted to bedside commode where she had a large bowel movement and urinated another 600 ml. Patient stated her stomach feels better at this time.

## 2014-09-20 NOTE — Progress Notes (Signed)
Patient lying in bed crying. Patient complaining that her stomach is hurting. Patient has said she needs to urinate multiple times this morning and has only urinated 150 ml. Bladder scanner showed less that 200 ml. Patients pain noted in right mid quadrant near umbilical to lower abdomen. Dr. Luan Pulling paged.

## 2014-09-21 DIAGNOSIS — K5900 Constipation, unspecified: Secondary | ICD-10-CM | POA: Diagnosis present

## 2014-09-21 LAB — GLUCOSE, CAPILLARY: GLUCOSE-CAPILLARY: 157 mg/dL — AB (ref 70–99)

## 2014-09-21 NOTE — Clinical Social Work Psychosocial (Deleted)
Clinical Social Work Department BRIEF PSYCHOSOCIAL ASSESSMENT 09/21/2014  Patient:  [name]     Account Number:  [id number]     Admit date:  09/20/2014  Clinical Social Worker:  Wyatt Haste  Date/Time:  09/21/2014 09:52 AM  Referred by:  CSW  Date Referred:  09/21/2014 Referred for  ALF Placement   Other Referral:   Interview type:  Patient Other interview type:    PSYCHOSOCIAL DATA Living Status:  FACILITY Admitted from facility:  Other Level of care:  Assisted Living Primary support name:  Joe Primary support relationship to patient:  CHILD, ADULT Degree of support available:   supportive per pt    CURRENT CONCERNS Current Concerns  Post-Acute Placement   Other Concerns:    SOCIAL WORK ASSESSMENT / PLAN CSW met with pt at bedside. Pt alert and oriented and reports she has been a resident at [facility] for over 3 years. Pt has 5 sons, but only one "looks after" her. Joe lives nearby and visits regularly. Pt states she was living at home alone and decided she wanted to search for a facility. She found [facility] and really liked it. Pt states she enjoys social interaction that she was missing at home. She has been on 2 liters oxygen for many years. Pt requests to return to [facility] at d/c. Per Judeen Hammans, supervisor in charge at facility, pt requires assist with bathing, dressing, and toileting. She uses an IT trainer wheelchair and can transfer independently. Pt did not have any home health prior to admission and is okay to return.   Assessment/plan status:  Psychosocial Support/Ongoing Assessment of Needs Other assessment/ plan:   Information/referral to community resources:   [facility] [place]    PATIENT'S/FAMILY'S RESPONSE TO PLAN OF CARE: Pt reports positive feelings regarding return to [facility] when medically stable. CSW will continue to follow.       Benay Pike, [place]

## 2014-09-21 NOTE — Discharge Summary (Signed)
Physician Discharge Summary  Patient ID: [name] MRN: [id number] DOB/AGE: [birth year] 81 y.o. Primary Care Physician:LUKING,SCOTT, MD Admit date: 09/15/2014 Discharge date: 09/21/2014    Discharge Diagnoses:    Principal Problem:   UTI (lower urinary tract infection) Active Problems:   Hypothyroidism   Essential hypertension   CARDIAC MURMUR   Alzheimer's disease   Bacteremia   Sepsis   Constipation     Medication List    STOP taking these medications        ALPRAZolam 0.25 MG tablet  Commonly known as:  XANAX      TAKE these medications        acetaminophen 500 MG tablet  Commonly known as:  TYLENOL  Take 500-1,000 mg by mouth every 6 (six) hours as needed (pain(Max 5 tablets per 24hours)).     Acidophilus Tabs  Take 1 tablet by mouth daily.     Acidophilus Tabs  TAKE ONE TABLET BY MOUTH ONCE DAILY.     allopurinol 100 MG tablet  Commonly known as:  ZYLOPRIM  TAKE ONE TABLET BY MOUTH ONCE DAILY.     amitriptyline 75 MG tablet  Commonly known as:  ELAVIL  TAKE (1) TABLET BY MOUTH AT BEDTIME.     atorvastatin 20 MG tablet  Commonly known as:  LIPITOR  TAKE (1) TABLET BY MOUTH AT BEDTIME.     cefUROXime 250 MG tablet  Commonly known as:  CEFTIN  Take 1 tablet (250 mg total) by mouth 2 (two) times daily with a meal.     citalopram 10 MG tablet  Commonly known as:  CELEXA  TAKE ONE TABLET BY MOUTH ONCE DAILY.     donepezil 10 MG tablet  Commonly known as:  ARICEPT  Take 10 mg by mouth at bedtime.     gabapentin 100 MG capsule  Commonly known as:  NEURONTIN  Take 1 capsule (100 mg total) by mouth at bedtime.     levothyroxine 112 MCG tablet  Commonly known as:  SYNTHROID, LEVOTHROID  Take 1 tablet (112 mcg total) by mouth daily.     linagliptin 5 MG Tabs tablet  Commonly known as:  TRADJENTA  Take 5 mg by mouth daily.     LORazepam 2 MG tablet  Commonly known as:  ATIVAN  Take 1 mg by mouth 5 (five) times daily. As needed for anxiety      losartan-hydrochlorothiazide 100-12.5 MG per tablet  Commonly known as:  HYZAAR  TAKE ONE TABLET BY MOUTH ONCE DAILY.     NAMENDA 10 MG tablet  Generic drug:  memantine  TAKE (1) TABLET BY MOUTH TWICE DAILY.     nystatin-triamcinolone cream  Commonly known as:  MYCOLOG II  Apply 1 application topically 2 (two) times daily as needed (rash).     PRESERVISION/LUTEIN PO  Take 1 tablet by mouth 2 (two) times daily.     traZODone 50 MG tablet  Commonly known as:  DESYREL  Take 25 mg by mouth at bedtime.        Discharged Condition:improved    Consults:none  Significant Diagnostic Studies: Dg Abd Acute W/chest  09/20/2014   CLINICAL DATA:  Periumbilical mid abdominal pain extending into the lower abdomen, onset today, history of cholecystectomy and diabetes.  EXAM: ACUTE ABDOMEN SERIES (ABDOMEN 2 VIEW & CHEST 1 VIEW)  COMPARISON:  Chest x-ray dated October 24, 2013  FINDINGS: The lungs are adequately inflated. There is subsegmental atelectasis in the right perihilar region. There is no significant  pleural effusion. The cardiac silhouette is top-normal in size. There is tortuosity of the ascending and descending thoracic aorta. There is scoliosis of the thoracolumbar spine as well as multilevel degenerative disc disease.  Within the abdomen the bowel gas pattern is nonspecific. There is no evidence of ileus or obstruction. There is mild gaseous distention of the stomach. There is stool and gas in the rectosigmoid. There are surgical clips in the gallbladder fossa. No free extraluminal gas collections are demonstrated.  IMPRESSION: 1. Nonspecific bowel gas pattern without evidence of obstruction or perforation. Moderately increased stool burden in the left colon and rectosigmoid may reflect clinical constipation. Followup abdominal and pelvic CT scanning may be useful. 2. Subsegmental atelectasis in the right perihilar region is new since the previous study. There is no evidence of CHF nor  pneumonia. 3. Moderate S-shaped thoracolumbar scoliosis.   Electronically Signed   By: David  Martinique   On: 09/20/2014 12:15    Lab Results: Basic Metabolic Panel:  Recent Labs  09/19/14 0556  NA 139  K 3.7  CL 109  CO2 25  GLUCOSE 155*  BUN 11  CREATININE 0.78  CALCIUM 8.2*   Liver Function Tests: No results for input(s): AST, ALT, ALKPHOS, BILITOT, PROT, ALBUMIN in the last 72 hours.   CBC:  Recent Labs  09/19/14 0556 09/20/14 1344  WBC 5.7 4.7  NEUTROABS 3.4  --   HGB 10.1* 10.8*  HCT 29.9* 32.2*  MCV 88.7 89.2  PLT 139* 170    Recent Results (from the past 240 hour(s))  Blood culture (routine x 2)     Status: None   Collection Time: 09/15/14 10:24 PM  Result Value Ref Range Status   Specimen Description RIGHT ANTECUBITAL  Final   Special Requests BOTTLES DRAWN AEROBIC AND ANAEROBIC [facility] EACH  Final   Culture   Final    ESCHERICHIA COLI Note: Gram Stain Report Called to,Read Back By and Verified With: SANTOS K 1258 09/16/14 BY Tyrone Schimke M Performed at [facility] Performed at [facility]    Report Status 09/19/2014 FINAL  Final   Organism ID, Bacteria ESCHERICHIA COLI  Final      Susceptibility   Escherichia coli - MIC*    AMPICILLIN >=32 RESISTANT Resistant     AMPICILLIN/SULBACTAM >=32 RESISTANT Resistant     CEFAZOLIN <=4 SENSITIVE Sensitive     CEFEPIME <=1 SENSITIVE Sensitive     CEFTAZIDIME <=1 SENSITIVE Sensitive     CEFTRIAXONE <=1 SENSITIVE Sensitive     CIPROFLOXACIN 0.5 SENSITIVE Sensitive     GENTAMICIN <=1 SENSITIVE Sensitive     IMIPENEM <=0.25 SENSITIVE Sensitive     PIP/TAZO <=4 SENSITIVE Sensitive     TOBRAMYCIN <=1 SENSITIVE Sensitive     TRIMETH/SULFA <=20 SENSITIVE Sensitive     * ESCHERICHIA COLI  Blood culture (routine x 2)     Status: None   Collection Time: 09/15/14 10:24 PM  Result Value Ref Range Status   Specimen Description BLOOD LEFT HAND  Final   Special Requests BOTTLES DRAWN AEROBIC AND ANAEROBIC [facility]  EACH  Final   Culture   Final    ESCHERICHIA COLI Note: SUSCEPTIBILITIES PERFORMED ON PREVIOUS CULTURE WITHIN THE LAST 5 DAYS. Note: Gram Stain Report Called to,Read Back By and Verified With: SANTOS K 12:58 09/16/14 BY Elza Rafter Performed at [facility] Performed at [facility]    Report Status 09/19/2014 FINAL  Final  Urine culture     Status: None  Collection Time: 09/16/14  6:17 PM  Result Value Ref Range Status   Specimen Description URINE, CLEAN CATCH  Final   Special Requests NONE  Final   Colony Count NO GROWTH Performed at [facility]   Final   Culture NO GROWTH Performed at [facility]   Final   Report Status 09/18/2014 FINAL  Final     Hospital Course: she was admitted with uti and sepsis. She grew E.coli on outpatient urine culture and on blood in the hospital. She was treated with IV antibiotics and improved.   She has been more sleepy than usual.  She had an episode of abdominal pain related to constipation. She has been afebrile and essentially back to baseline.   Discharge Exam: Blood pressure 137/74, pulse 71, temperature 98.5 F (36.9 C), temperature source Oral, resp. rate 20, height 5\' 4"  (1.626 m), weight 80.8 kg (178 lb 2.1 oz), SpO2 97 %. Sleepy but arousable. Chest clear. Abdomen soft  Disposition: home      Discharge Instructions    Discharge patient    Complete by:  As directed              Signed: Daveigh Batty L   09/21/2014, 10:55 AM

## 2014-09-21 NOTE — Progress Notes (Signed)
Subjective: The events of yesterday are noted. I think she had constipation causing her abdominal pai.n.  Objective: Vital signs in last 24 hours: Temp:  [98.2 F (36.8 C)-98.7 F (37.1 C)] 98.5 F (36.9 C) (02/16 0545) Pulse Rate:  [65-98] 71 (02/16 0545) Resp:  [18-20] 20 (02/16 0545) BP: (137-147)/(60-74) 137/74 mmHg (02/16 0545) SpO2:  [83 %-99 %] 97 % (02/16 0545) Weight change:  Last BM Date: 09/20/14  Intake/Output from previous day: 02/15 0701 - 02/16 0700 In: 2137.5 [P.O.:240; I.V.:1847.5; IV Piggyback:50] Out: 5573 [Urine:3100; Stool:1]  PHYSICAL EXAM General appearance: She is still sleepy but better Resp: clear to auscultation bilaterally Cardio: regular rate and rhythm, S1, S2 normal, no murmur, click, rub or gallop GI: soft, non-tender; bowel sounds normal; no masses,  no organomegaly Extremities: extremities normal, atraumatic, no cyanosis or edema  Lab Results:  Results for orders placed or performed during the hospital encounter of 09/15/14 (from the past 48 hour(s))  Glucose, capillary     Status: Abnormal   Collection Time: 09/19/14 11:46 AM  Result Value Ref Range   Glucose-Capillary 124 (H) 70 - 99 mg/dL   Comment 1 Notify RN   Glucose, capillary     Status: Abnormal   Collection Time: 09/19/14  5:08 PM  Result Value Ref Range   Glucose-Capillary 100 (H) 70 - 99 mg/dL   Comment 1 Notify RN   Glucose, capillary     Status: Abnormal   Collection Time: 09/19/14  9:23 PM  Result Value Ref Range   Glucose-Capillary 136 (H) 70 - 99 mg/dL  Glucose, capillary     Status: Abnormal   Collection Time: 09/20/14  7:27 AM  Result Value Ref Range   Glucose-Capillary 143 (H) 70 - 99 mg/dL   Comment 1 Notify RN    Comment 2 Document in Chart   Glucose, capillary     Status: Abnormal   Collection Time: 09/20/14 12:16 PM  Result Value Ref Range   Glucose-Capillary 139 (H) 70 - 99 mg/dL  CBC     Status: Abnormal   Collection Time: 09/20/14  1:44 PM  Result  Value Ref Range   WBC 4.7 4.0 - 10.5 K/uL   RBC 3.61 (L) 3.87 - 5.11 MIL/uL   Hemoglobin 10.8 (L) 12.0 - 15.0 g/dL   HCT 32.2 (L) 36.0 - 46.0 %   MCV 89.2 78.0 - 100.0 fL   MCH 29.9 26.0 - 34.0 pg   MCHC 33.5 30.0 - 36.0 g/dL   RDW 13.9 11.5 - 15.5 %   Platelets 170 150 - 400 K/uL  Lipase, blood     Status: None   Collection Time: 09/20/14  1:44 PM  Result Value Ref Range   Lipase 22 11 - 59 U/L  Glucose, capillary     Status: Abnormal   Collection Time: 09/20/14  4:18 PM  Result Value Ref Range   Glucose-Capillary 128 (H) 70 - 99 mg/dL  Glucose, capillary     Status: Abnormal   Collection Time: 09/20/14  8:19 PM  Result Value Ref Range   Glucose-Capillary 151 (H) 70 - 99 mg/dL  Glucose, capillary     Status: Abnormal   Collection Time: 09/21/14  8:16 AM  Result Value Ref Range   Glucose-Capillary 157 (H) 70 - 99 mg/dL    ABGS No results for input(s): PHART, PO2ART, TCO2, HCO3 in the last 72 hours.  Invalid input(s): PCO2 CULTURES Recent Results (from the past 240 hour(s))  Blood culture (routine x  2)     Status: None   Collection Time: 09/15/14 10:24 PM  Result Value Ref Range Status   Specimen Description RIGHT ANTECUBITAL  Final   Special Requests BOTTLES DRAWN AEROBIC AND ANAEROBIC [facility] EACH  Final   Culture   Final    ESCHERICHIA COLI Note: Gram Stain Report Called to,Read Back By and Verified With: SANTOS K [id number] 09/16/14 BY Tyrone Schimke M Performed at [facility] Performed at [facility]    Report Status 09/19/2014 FINAL  Final   Organism ID, Bacteria ESCHERICHIA COLI  Final      Susceptibility   Escherichia coli - MIC*    AMPICILLIN >=32 RESISTANT Resistant     AMPICILLIN/SULBACTAM >=32 RESISTANT Resistant     CEFAZOLIN <=4 SENSITIVE Sensitive     CEFEPIME <=1 SENSITIVE Sensitive     CEFTAZIDIME <=1 SENSITIVE Sensitive     CEFTRIAXONE <=1 SENSITIVE Sensitive     CIPROFLOXACIN 0.5 SENSITIVE Sensitive     GENTAMICIN <=1 SENSITIVE Sensitive      IMIPENEM <=0.25 SENSITIVE Sensitive     PIP/TAZO <=4 SENSITIVE Sensitive     TOBRAMYCIN <=1 SENSITIVE Sensitive     TRIMETH/SULFA <=20 SENSITIVE Sensitive     * ESCHERICHIA COLI  Blood culture (routine x 2)     Status: None   Collection Time: 09/15/14 10:24 PM  Result Value Ref Range Status   Specimen Description BLOOD LEFT HAND  Final   Special Requests BOTTLES DRAWN AEROBIC AND ANAEROBIC [facility] EACH  Final   Culture   Final    ESCHERICHIA COLI Note: SUSCEPTIBILITIES PERFORMED ON PREVIOUS CULTURE WITHIN THE LAST 5 DAYS. Note: Gram Stain Report Called to,Read Back By and Verified With: SANTOS K 12:58 09/16/14 BY Elza Rafter Performed at [facility] Performed at [facility]    Report Status 09/19/2014 FINAL  Final  Urine culture     Status: None   Collection Time: 09/16/14  6:17 PM  Result Value Ref Range Status   Specimen Description URINE, CLEAN CATCH  Final   Special Requests NONE  Final   Colony Count NO GROWTH Performed at [facility]   Final   Culture NO GROWTH Performed at [facility]   Final   Report Status 09/18/2014 FINAL  Final   Studies/Results: Dg Abd Acute W/chest  09/20/2014   CLINICAL DATA:  Periumbilical mid abdominal pain extending into the lower abdomen, onset today, history of cholecystectomy and diabetes.  EXAM: ACUTE ABDOMEN SERIES (ABDOMEN 2 VIEW & CHEST 1 VIEW)  COMPARISON:  Chest x-ray dated October 24, 2013  FINDINGS: The lungs are adequately inflated. There is subsegmental atelectasis in the right perihilar region. There is no significant pleural effusion. The cardiac silhouette is top-normal in size. There is tortuosity of the ascending and descending thoracic aorta. There is scoliosis of the thoracolumbar spine as well as multilevel degenerative disc disease.  Within the abdomen the bowel gas pattern is nonspecific. There is no evidence of ileus or obstruction. There is mild gaseous distention of the stomach. There is stool and  gas in the rectosigmoid. There are surgical clips in the gallbladder fossa. No free extraluminal gas collections are demonstrated.  IMPRESSION: 1. Nonspecific bowel gas pattern without evidence of obstruction or perforation. Moderately increased stool burden in the left colon and rectosigmoid may reflect clinical constipation. Followup abdominal and pelvic CT scanning may be useful. 2. Subsegmental atelectasis in the right perihilar region is new since the previous study. There is no  evidence of CHF nor pneumonia. 3. Moderate S-shaped thoracolumbar scoliosis.   Electronically Signed   By: David  Martinique   On: 09/20/2014 12:15    Medications:  Prior to Admission:  Prescriptions prior to admission  Medication Sig Dispense Refill Last Dose  . allopurinol (ZYLOPRIM) 100 MG tablet TAKE ONE TABLET BY MOUTH ONCE DAILY. 30 tablet 9 09/15/2014 at Unknown time  . amitriptyline (ELAVIL) 75 MG tablet TAKE (1) TABLET BY MOUTH AT BEDTIME. 30 tablet 5 09/14/2014 at Unknown time  . atorvastatin (LIPITOR) 20 MG tablet TAKE (1) TABLET BY MOUTH AT BEDTIME. 30 tablet 9 09/14/2014 at Unknown time  . citalopram (CELEXA) 10 MG tablet TAKE ONE TABLET BY MOUTH ONCE DAILY. 30 tablet 9 09/15/2014 at Unknown time  . donepezil (ARICEPT) 10 MG tablet Take 10 mg by mouth at bedtime.   09/15/2014 at Unknown time  . Lactobacillus (ACIDOPHILUS) TABS TAKE ONE TABLET BY MOUTH ONCE DAILY. 30 each 5 09/15/2014 at Unknown time  . Lactobacillus (ACIDOPHILUS) TABS Take 1 tablet by mouth daily.   09/15/2014 at Unknown time  . levothyroxine (SYNTHROID, LEVOTHROID) 112 MCG tablet Take 1 tablet (112 mcg total) by mouth daily. 30 tablet 3 09/15/2014 at Unknown time  . linagliptin (TRADJENTA) 5 MG TABS tablet Take 5 mg by mouth daily.   09/15/2014 at Unknown time  . LORazepam (ATIVAN) 2 MG tablet Take 1 mg by mouth 5 (five) times daily. As needed for anxiety   09/14/2014 at Unknown time  . losartan-hydrochlorothiazide (HYZAAR) 100-12.5 MG per tablet TAKE ONE  TABLET BY MOUTH ONCE DAILY. 30 tablet 9 09/15/2014 at Unknown time  . Multiple Vitamins-Minerals (PRESERVISION/LUTEIN PO) Take 1 tablet by mouth 2 (two) times daily.    09/15/2014 at Unknown time  . NAMENDA 10 MG tablet TAKE (1) TABLET BY MOUTH TWICE DAILY. 60 tablet 9 09/15/2014 at Unknown time  . traZODone (DESYREL) 50 MG tablet Take 25 mg by mouth at bedtime.   09/15/2014 at Unknown time  . acetaminophen (TYLENOL) 500 MG tablet Take 500-1,000 mg by mouth every 6 (six) hours as needed (pain(Max 5 tablets per 24hours)).    unknown  . ALPRAZolam (XANAX) 0.25 MG tablet TAKE 1 TABLET BY MOUTH TWICE DAILY AT 8AM AND 2PM. (Patient not taking: Reported on 09/15/2014) 60 tablet 5 Taking  . nystatin-triamcinolone (MYCOLOG II) cream Apply 1 application topically 2 (two) times daily as needed (rash).    unknown   Scheduled: . amitriptyline  75 mg Oral QHS  . atorvastatin  20 mg Oral q1800  . cefTRIAXone (ROCEPHIN)  IV  1 g Intravenous Q24H  . citalopram  10 mg Oral Daily  . donepezil  10 mg Oral QHS  . enoxaparin (LOVENOX) injection  40 mg Subcutaneous Q24H  . insulin aspart  0-9 Units Subcutaneous TID WC  . levothyroxine  112 mcg Oral QAC breakfast  . memantine  10 mg Oral BID  . potassium chloride  20 mEq Oral BID  . traZODone  25 mg Oral QHS   Continuous: . sodium chloride 1 mL (09/21/14 0618)   PPI:RJJOACZYSAYTK **OR** acetaminophen, HYDROmorphone (DILAUDID) injection, LORazepam, LORazepam, ondansetron **OR** ondansetron (ZOFRAN) IV  Assesment: She had UTI with bacteremia and sepsis. She is much improved. She had abdominal pain related to constipation. That is improved. She is still sleepy but lives in a assisted living facility/family care home and her caretakers think they can take care of her at home Principal Problem:   UTI (lower urinary tract infection) Active Problems:  Hypothyroidism   Essential hypertension   CARDIAC MURMUR   Alzheimer's disease   Bacteremia   Sepsis    Plan:  Discharge today    LOS: 5 days   Traycen Goyer L 09/21/2014, 9:09 AM

## 2014-09-21 NOTE — Clinical Social Work Note (Signed)
Pt d/c today back to [facility]. Pt's son Elta [place] and facility aware and agreeable. Facility here to provide transport. Request home health PT/RN. CM notified.   Benay Pike, [place]

## 2014-09-22 NOTE — Care Management Utilization Note (Signed)
UR completed 

## 2014-10-17 IMAGING — CT CT ANGIO CHEST
2 of 6 series · 19 of 46 positions shown · IV contrast (APPLIED)
Comparison: None.

CLINICAL DATA: Shortness of breath.

CT ANGIOGRAPHY CHEST
TECHNIQUE: Multidetector CT imaging of the chest using the
standard protocol during bolus administration of intravenous
contrast. Multiplanar reconstructed images including MIPs were
obtained and reviewed to evaluate the vascular anatomy.
Contrast: 100mL OMNIPAQUE IOHEXOL 350 MG/ML SOLN

[Series 6: pulm embolism 1.0 b25f thin · axial · 0.63mm/px · z∈[-248,-7]mm · 16 of 265 slices shown]
[im 12/265  lung]
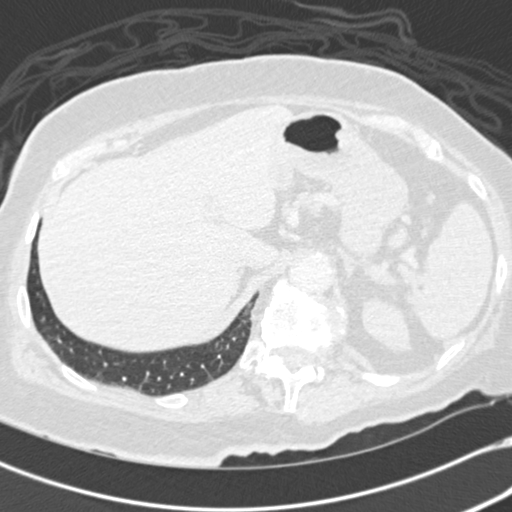
[im 35/265  soft-tissue]
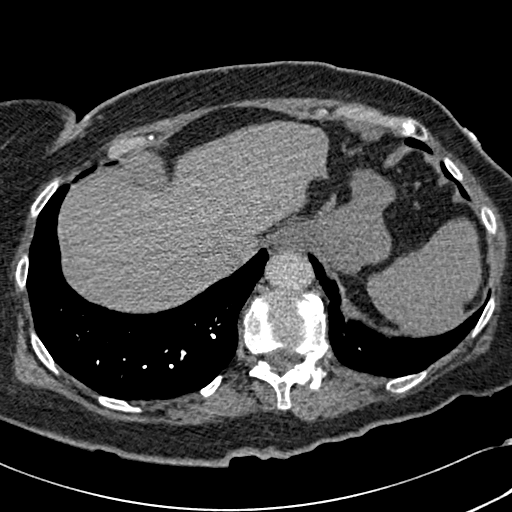
[im 46/265  lung]
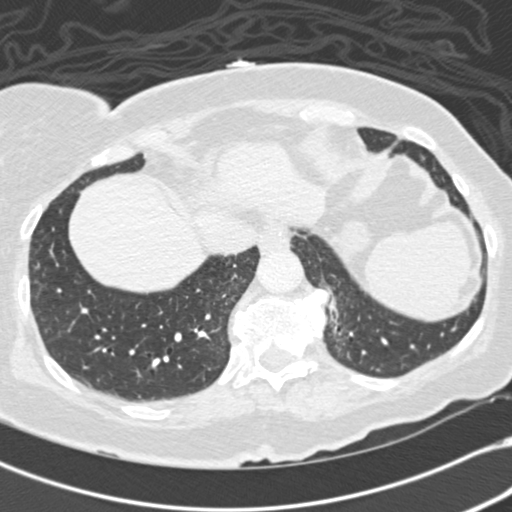
[im 58/265  soft-tissue]
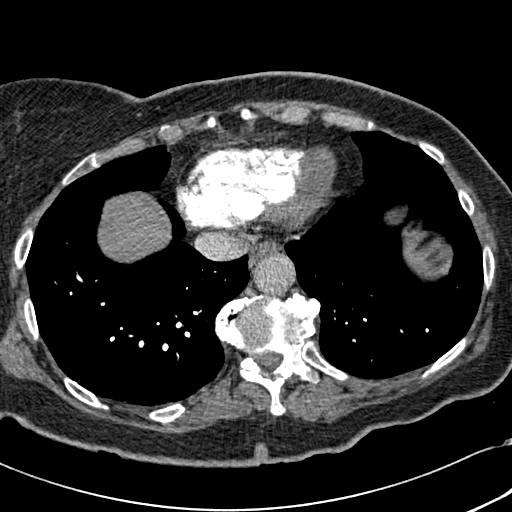
[im 81/265  lung]
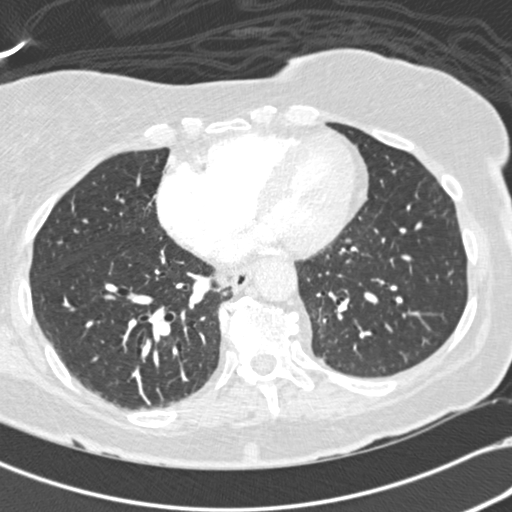
[im 92/265  soft-tissue]
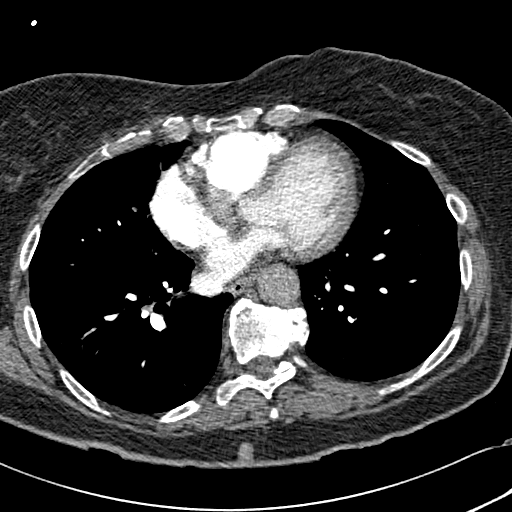
[im 104/265  lung]
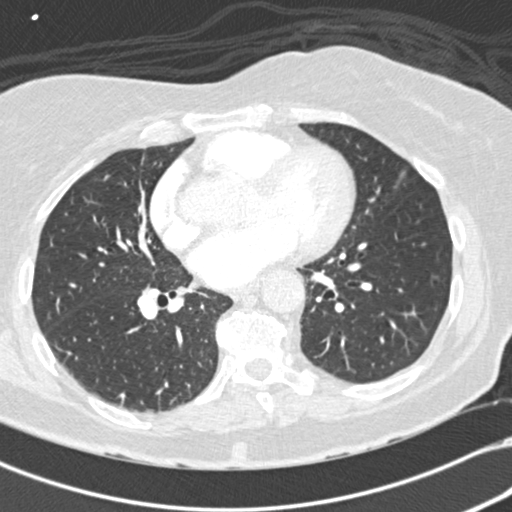
[im 127/265  soft-tissue]
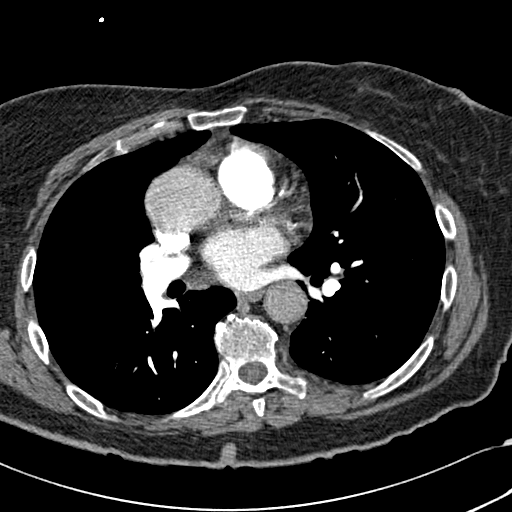
[im 138/265  lung]
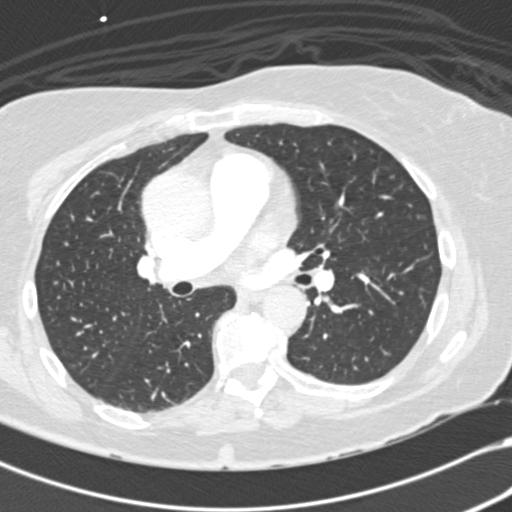
[im 161/265  soft-tissue]
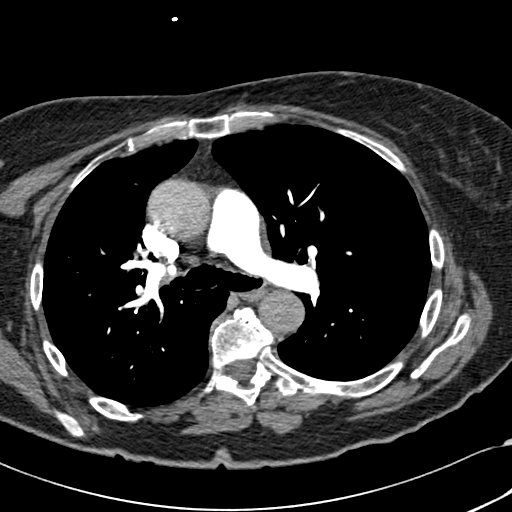
[im 173/265  lung]
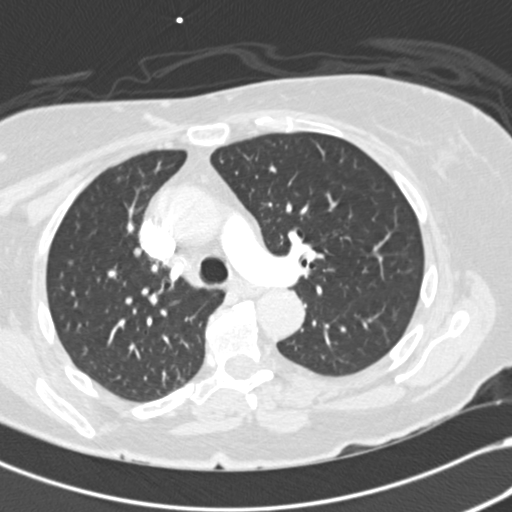
[im 184/265  soft-tissue]
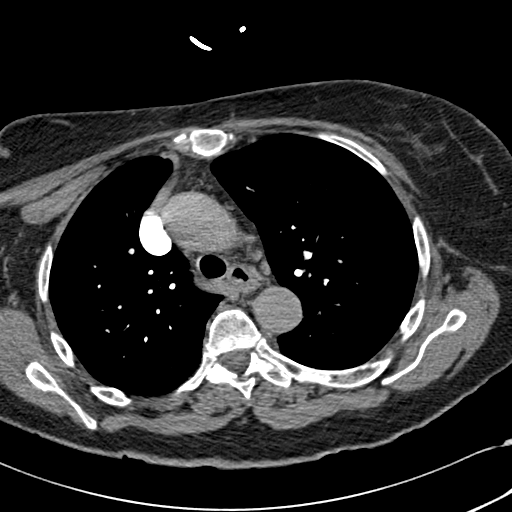
[im 207/265  lung]
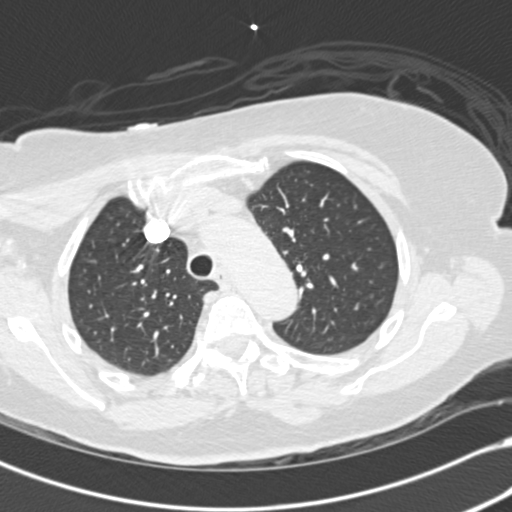
[im 219/265  soft-tissue]
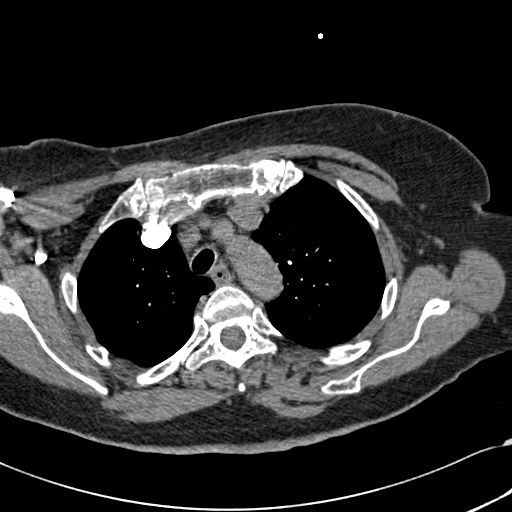
[im 230/265  lung]
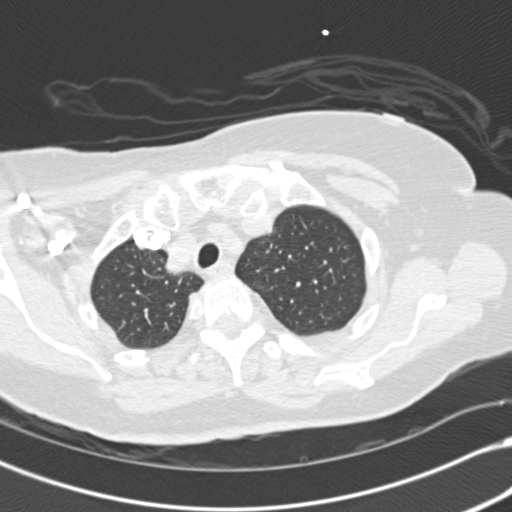
[im 253/265  soft-tissue]
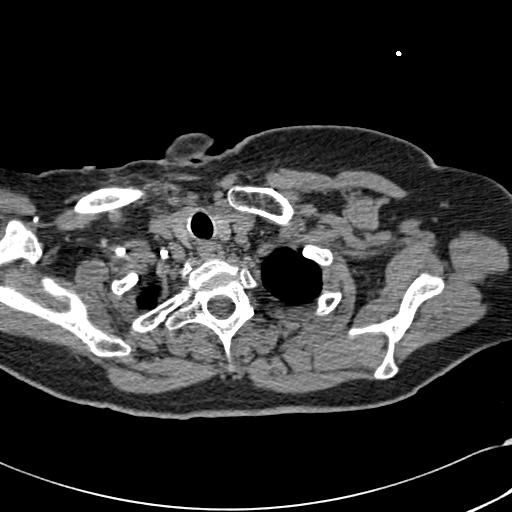

[Series 602: coronal mpr · coronal · 0.63mm/px · 3 of 114 slices shown]
[im 29/114  soft-tissue]
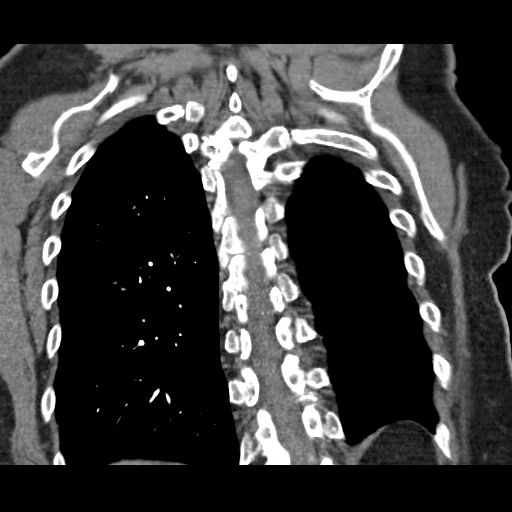
[im 57/114  soft-tissue]
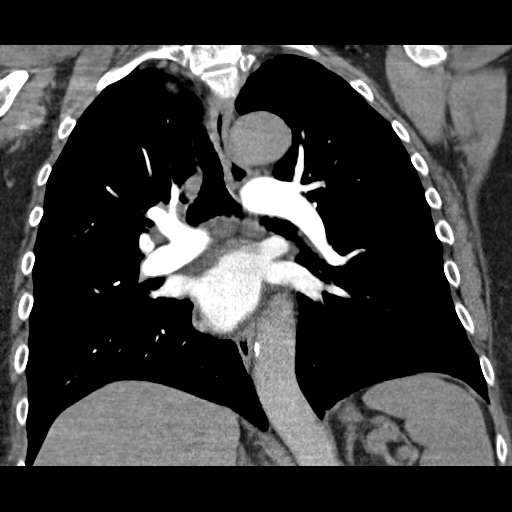
[im 85/114  soft-tissue]
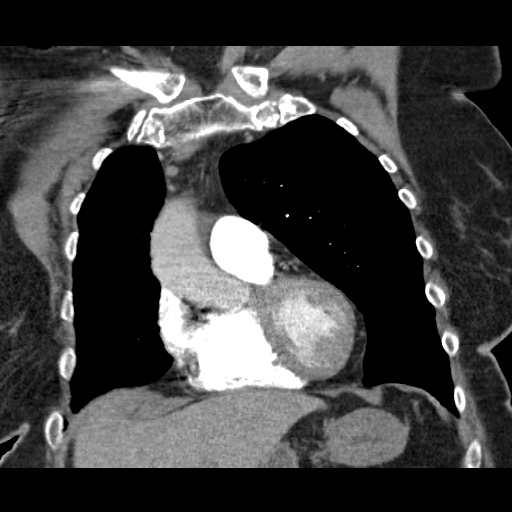

[19 of 46 positions shown; findings below may reference images not displayed]

FINDINGS: No filling defects in the pulmonary arteries to suggest
pulmonary emboli. Heart is normal size. Aorta is normal caliber. No
mediastinal, hilar, or axillary adenopathy.  Visualized thyroid and
chest wall soft tissues unremarkable. Lungs are clear.  No focal
airspace opacities or suspicious nodules.  No effusions. Imaging
into the upper abdomen shows no acute findings.

Degenerative changes throughout the thoracic spine.  No acute bony
abnormality.
IMPRESSION: No acute cardiopulmonary disease.  No evidence of pulmonary
embolus.

## 2014-10-26 ENCOUNTER — Encounter: Payer: Self-pay | Admitting: Gastroenterology

## 2015-01-03 ENCOUNTER — Emergency Department (HOSPITAL_COMMUNITY): Payer: Medicare Other

## 2015-01-03 ENCOUNTER — Emergency Department (HOSPITAL_COMMUNITY)
Admission: EM | Admit: 2015-01-03 | Discharge: 2015-01-03 | Disposition: A | Discharge status: Home / Self Care | Payer: Medicare Other | Attending: Emergency Medicine | Admitting: Emergency Medicine

## 2015-01-03 ENCOUNTER — Encounter (HOSPITAL_COMMUNITY): Payer: Self-pay | Admitting: Emergency Medicine

## 2015-01-03 ENCOUNTER — Emergency Department (HOSPITAL_COMMUNITY)
Admission: EM | Admit: 2015-01-03 | Discharge: 2015-01-03 | Disposition: A | Discharge status: Other Acute Inpatient Hospital | Payer: Self-pay

## 2015-01-03 DIAGNOSIS — G309 Alzheimer's disease, unspecified: Secondary | ICD-10-CM | POA: Insufficient documentation

## 2015-01-03 DIAGNOSIS — I10 Essential (primary) hypertension: Secondary | ICD-10-CM | POA: Insufficient documentation

## 2015-01-03 DIAGNOSIS — E039 Hypothyroidism, unspecified: Secondary | ICD-10-CM | POA: Insufficient documentation

## 2015-01-03 DIAGNOSIS — F329 Major depressive disorder, single episode, unspecified: Secondary | ICD-10-CM | POA: Insufficient documentation

## 2015-01-03 DIAGNOSIS — E079 Disorder of thyroid, unspecified: Secondary | ICD-10-CM | POA: Diagnosis not present

## 2015-01-03 DIAGNOSIS — G43909 Migraine, unspecified, not intractable, without status migrainosus: Secondary | ICD-10-CM | POA: Diagnosis not present

## 2015-01-03 DIAGNOSIS — Z8719 Personal history of other diseases of the digestive system: Secondary | ICD-10-CM | POA: Diagnosis not present

## 2015-01-03 DIAGNOSIS — Z79899 Other long term (current) drug therapy: Secondary | ICD-10-CM | POA: Insufficient documentation

## 2015-01-03 DIAGNOSIS — F0390 Unspecified dementia without behavioral disturbance: Secondary | ICD-10-CM

## 2015-01-03 DIAGNOSIS — M109 Gout, unspecified: Secondary | ICD-10-CM | POA: Diagnosis not present

## 2015-01-03 DIAGNOSIS — M1990 Unspecified osteoarthritis, unspecified site: Secondary | ICD-10-CM | POA: Insufficient documentation

## 2015-01-03 DIAGNOSIS — R4182 Altered mental status, unspecified: Secondary | ICD-10-CM | POA: Diagnosis present

## 2015-01-03 DIAGNOSIS — Z8551 Personal history of malignant neoplasm of bladder: Secondary | ICD-10-CM | POA: Diagnosis not present

## 2015-01-03 DIAGNOSIS — F0280 Dementia in other diseases classified elsewhere without behavioral disturbance: Secondary | ICD-10-CM | POA: Diagnosis not present

## 2015-01-03 LAB — CBC WITH DIFFERENTIAL/PLATELET
BASOS PCT: 0 % (ref 0–1)
Basophils Absolute: 0 10*3/uL (ref 0.0–0.1)
Eosinophils Absolute: 0.1 10*3/uL (ref 0.0–0.7)
Eosinophils Relative: 1 % (ref 0–5)
HEMATOCRIT: 37.2 % (ref 36.0–46.0)
Hemoglobin: 12.7 g/dL (ref 12.0–15.0)
LYMPHS ABS: 1.7 10*3/uL (ref 0.7–4.0)
LYMPHS PCT: 27 % (ref 12–46)
MCH: 30 pg (ref 26.0–34.0)
MCHC: 34.1 g/dL (ref 30.0–36.0)
MCV: 87.7 fL (ref 78.0–100.0)
MONO ABS: 0.5 10*3/uL (ref 0.1–1.0)
MONOS PCT: 8 % (ref 3–12)
NEUTROS ABS: 4 10*3/uL (ref 1.7–7.7)
NEUTROS PCT: 64 % (ref 43–77)
PLATELETS: 138 10*3/uL — AB (ref 150–400)
RBC: 4.24 MIL/uL (ref 3.87–5.11)
RDW: 13.6 % (ref 11.5–15.5)
WBC: 6.3 10*3/uL (ref 4.0–10.5)

## 2015-01-03 LAB — COMPREHENSIVE METABOLIC PANEL
ALT: 17 U/L (ref 14–54)
AST: 21 U/L (ref 15–41)
Albumin: 3.7 g/dL (ref 3.5–5.0)
Alkaline Phosphatase: 77 U/L (ref 38–126)
Anion gap: 9 (ref 5–15)
BILIRUBIN TOTAL: 0.5 mg/dL (ref 0.3–1.2)
BUN: 16 mg/dL (ref 6–20)
CO2: 26 mmol/L (ref 22–32)
Calcium: 9.2 mg/dL (ref 8.9–10.3)
Chloride: 98 mmol/L — ABNORMAL LOW (ref 101–111)
Creatinine, Ser: 0.77 mg/dL (ref 0.44–1.00)
GFR calc Af Amer: >60 mL/min (ref >60)
GLUCOSE: 136 mg/dL — AB (ref 65–99)
POTASSIUM: 3.9 mmol/L (ref 3.5–5.1)
SODIUM: 133 mmol/L — AB (ref 135–145)
Total Protein: 6.8 g/dL (ref 6.5–8.1)

## 2015-01-03 LAB — URINALYSIS, ROUTINE W REFLEX MICROSCOPIC
BILIRUBIN URINE: NEGATIVE
Glucose, UA: NEGATIVE mg/dL
Hgb urine dipstick: NEGATIVE
KETONES UR: NEGATIVE mg/dL
NITRITE: NEGATIVE
PH: 7 (ref 5.0–8.0)
PROTEIN: NEGATIVE mg/dL
Specific Gravity, Urine: 1.015 (ref 1.005–1.030)
UROBILINOGEN UA: 0.2 mg/dL (ref 0.0–1.0)

## 2015-01-03 LAB — URINE MICROSCOPIC-ADD ON

## 2015-01-03 MED ORDER — CIPROFLOXACIN HCL 250 MG PO TABS
250.0000 mg | ORAL_TABLET | Freq: Once | ORAL | Status: AC
  Administered 2015-01-03: 250 mg via ORAL
  Filled 2015-01-03: qty 1

## 2015-01-03 MED ORDER — CIPROFLOXACIN HCL 250 MG PO TABS: 500.0000 mg | ORAL_TABLET | Freq: Two times a day (BID) | ORAL | Status: DC

## 2015-01-03 NOTE — ED Notes (Signed)
Pts daughter brought pt in stating that she thinks pt has a UTI.  Unsure of symptoms as daughter left triage.

## 2015-01-03 NOTE — ED Provider Notes (Signed)
CSN: [id number]     Arrival date & time 01/03/15  1614 History   First MD Initiated Contact with Patient 01/03/15 1630     Chief Complaint  Patient presents with  . Urinary Tract Infection     (Consider location/radiation/quality/duration/timing/severity/associated sxs/prior Treatment) Patient is a 81 y.o. female presenting with altered mental status. The history is provided by a relative (the pt has been more confused this week.  this has happened before when the pt had a uti).  Altered Mental Status Presenting symptoms: no combativeness   Severity:  Mild Most recent episode:  2 days ago Episode history:  Multiple Timing:  Intermittent Progression:  Waxing and waning Chronicity:  Recurrent Context: dementia   Associated symptoms: no abdominal pain, no hallucinations, no headaches, no rash and no seizures     Past Medical History  Diagnosis Date  . Thyroid disease   . Hypertension   . Gout   . Dementia   . Vertigo   . Low back pain   . Hypothyroidism   . Arthritis   . Hyperlipidemia   . Hemorrhoids with complication   . Cancer 2012    Melanoma-Face Removed at [facility]  . Bladder cancer     saw dr. Rosana Hoes had surgery had follow about 3 years ago  . Atrial fibrillation   . Migraine   . Depression   . Hyperglycemia   . Alzheimer disease    Past Surgical History  Procedure Laterality Date  . Cholecystectomy    . Colonoscopy  11/21/09    Simple adenoma/pancolonic diverticulosis(next on due in 5/10 years)  . Eye surgery      Bilateral Cataract Surgery  . Flexible sigmoidoscopy  12/03/2011    Procedure: FLEXIBLE SIGMOIDOSCOPY;  Surgeon: Danie Binder, MD;  Location: [facility];  Service: Endoscopy;  Laterality: N/A;  12:00  . Bladder surgery      remove cancer  . Melanoma excision      on face   Family History  Problem Relation Age of Onset  . Migraines Mother   . Arthritis Father   . Diabetes Father   . Cancer Brother   . Diabetes Brother   .  Breast cancer Daughter   . Diabetes Brother    History  Substance Use Topics  . Smoking status: Never Smoker   . Smokeless tobacco: Never Used  . Alcohol Use: No   OB History    No data available     Review of Systems  Constitutional: Negative for appetite change and fatigue.  HENT: Negative for congestion, ear discharge and sinus pressure.   Eyes: Negative for discharge.  Respiratory: Negative for cough.   Cardiovascular: Negative for chest pain.  Gastrointestinal: Negative for abdominal pain and diarrhea.  Genitourinary: Negative for frequency and hematuria.  Musculoskeletal: Negative for back pain.  Skin: Negative for rash.  Neurological: Negative for seizures and headaches.  Psychiatric/Behavioral: Negative for hallucinations.      Allergies  Sulfonamide derivatives  Home Medications   Prior to Admission medications   Medication Sig Start Date End Date Taking? Authorizing Provider  acetaminophen (TYLENOL) 500 MG tablet Take 500-1,000 mg by mouth every 6 (six) hours as needed (pain(Max 5 tablets per 24hours)).    Yes Historical Provider, MD  allopurinol (ZYLOPRIM) 100 MG tablet TAKE ONE TABLET BY MOUTH ONCE DAILY. 03/24/13  Yes Kathyrn Drown, MD  amitriptyline (ELAVIL) 75 MG tablet TAKE (1) TABLET BY MOUTH AT BEDTIME. 06/22/13  Yes Scott A Luking,  MD  atorvastatin (LIPITOR) 20 MG tablet TAKE (1) TABLET BY MOUTH AT BEDTIME. 03/24/13  Yes Kathyrn Drown, MD  citalopram (CELEXA) 10 MG tablet TAKE ONE TABLET BY MOUTH ONCE DAILY. 03/24/13  Yes Kathyrn Drown, MD  donepezil (ARICEPT) 10 MG tablet Take 10 mg by mouth at bedtime.   Yes Historical Provider, MD  gabapentin (NEURONTIN) 100 MG capsule Take 1 capsule (100 mg total) by mouth at bedtime. 09/20/14  Yes Sinda Du, MD  Lactobacillus (ACIDOPHILUS) TABS TAKE ONE TABLET BY MOUTH ONCE DAILY. 06/22/13  Yes Kathyrn Drown, MD  levothyroxine (SYNTHROID, LEVOTHROID) 112 MCG tablet Take 1 tablet (112 mcg total) by mouth daily.  11/03/13  Yes Kathyrn Drown, MD  linagliptin (TRADJENTA) 5 MG TABS tablet Take 5 mg by mouth daily.   Yes Historical Provider, MD  LORazepam (ATIVAN) 2 MG tablet Take 1 mg by mouth 5 (five) times daily. As needed for anxiety   Yes Historical Provider, MD  losartan-hydrochlorothiazide (HYZAAR) 100-12.5 MG per tablet TAKE ONE TABLET BY MOUTH ONCE DAILY. 03/24/13  Yes Kathyrn Drown, MD  Multiple Vitamins-Minerals (PRESERVISION/LUTEIN PO) Take 1 tablet by mouth 2 (two) times daily.    Yes Historical Provider, MD  NAMENDA 10 MG tablet TAKE (1) TABLET BY MOUTH TWICE DAILY. 03/24/13  Yes Kathyrn Drown, MD  nystatin-triamcinolone (MYCOLOG II) cream Apply 1 application topically 2 (two) times daily as needed (rash).  12/11/12  Yes Historical Provider, MD  risperiDONE (RISPERDAL) 0.5 MG tablet Take 0.5 mg by mouth 2 (two) times daily as needed.   Yes Historical Provider, MD  traZODone (DESYREL) 50 MG tablet Take 25 mg by mouth at bedtime.   Yes Historical Provider, MD  cefUROXime (CEFTIN) 250 MG tablet Take 1 tablet (250 mg total) by mouth 2 (two) times daily with a meal. Patient not taking: Reported on 01/03/2015 09/20/14   Sinda Du, MD  ciprofloxacin (CIPRO) 250 MG tablet Take 2 tablets (500 mg total) by mouth 2 (two) times daily. One po bid x 7 days 01/03/15   Milton Ferguson, MD   BP 160/83 mmHg  Pulse 71  Temp(Src) 98.6 F (37 C) (Oral)  Resp 18  Ht 5\' 4"  (1.626 m)  Wt 173 lb 3 oz (78.557 kg)  BMI 29.71 kg/m2  SpO2 100% Physical Exam  Constitutional: She appears well-developed.  HENT:  Head: Normocephalic.  Eyes: Conjunctivae and EOM are normal. No scleral icterus.  Neck: Neck supple. No thyromegaly present.  Cardiovascular: Normal rate and regular rhythm.  Exam reveals no gallop and no friction rub.   No murmur heard. Pulmonary/Chest: No stridor. She has no wheezes. She has no rales. She exhibits no tenderness.  Abdominal: She exhibits no distension. There is no tenderness. There is no  rebound.  Musculoskeletal: Normal range of motion. She exhibits no edema.  Lymphadenopathy:    She has no cervical adenopathy.  Neurological: She is alert. She exhibits normal muscle tone. Coordination normal.  Pt only oriented to person, but follows commands   Skin: No rash noted. No erythema.  Psychiatric: She has a normal mood and affect. Her behavior is normal.    ED Course  Procedures (including critical care time) Labs Review Labs Reviewed  URINALYSIS, [place] (NOT AT [facility]) - Abnormal; Notable for the following:    Leukocytes, UA TRACE (*)    All other components within normal limits  CBC WITH DIFFERENTIAL/PLATELET - Abnormal; Notable for the following:    Platelets 138 (*)  All other components within normal limits  COMPREHENSIVE METABOLIC PANEL - Abnormal; Notable for the following:    Sodium 133 (*)    Chloride 98 (*)    Glucose, Bld 136 (*)    All other components within normal limits  URINE CULTURE  URINE CULTURE  URINE MICROSCOPIC-ADD ON    Imaging Review Ct Head Wo Contrast  01/03/2015   CLINICAL DATA:  Acute onset of altered mental status. Urinary tract infection. Initial encounter.  EXAM: CT HEAD WITHOUT CONTRAST  TECHNIQUE: Contiguous axial images were obtained from the base of the skull through the vertex without intravenous contrast.  COMPARISON:  CT of the head performed 10/24/2013  FINDINGS: There is no evidence of acute infarction, mass lesion, or intra- or extra-axial hemorrhage on CT.  Prominence of the ventricles and sulci reflects moderate cortical volume loss. Mild cerebellar atrophy is noted. Scattered periventricular and subcortical white matter change likely reflects small vessel ischemic microangiopathy. Chronic ischemic change is noted at the basal ganglia bilaterally.  The brainstem and fourth ventricle are within normal limits. The cerebral hemispheres demonstrate grossly normal gray-white differentiation. No mass effect or  midline shift is seen.  There is no evidence of fracture; visualized osseous structures are unremarkable in appearance. The orbits are within normal limits. The paranasal sinuses and mastoid air cells are well-aerated. No significant soft tissue abnormalities are seen.  IMPRESSION: 1. No acute intracranial pathology seen on CT. 2. Moderate cortical volume loss and scattered small vessel ischemic microangiopathy. 3. Chronic ischemic change at the basal ganglia bilaterally.   Electronically Signed   By: Garald Balding M.D.   On: 01/03/2015 19:36   Dg Chest Portable 1 View  01/03/2015   CLINICAL DATA:  Pt was brought to ED by daughter tonight due to UTI symptoms. Fever and AMS currently. Hx HTN, alzheimer's, melanoma, bladder CA, nonsmoker  EXAM: PORTABLE CHEST - 1 VIEW  COMPARISON:  09/20/2014  FINDINGS: Cardiac silhouette normal in size. Aorta is mildly uncoiled. No mediastinal or hilar masses or evidence of adenopathy.  Clear lungs. No pleural effusion on the right in the thorax is demineralized but grossly intact.  IMPRESSION: No active disease.   Electronically Signed   By: Lajean Manes M.D.   On: 01/03/2015 17:13     EKG Interpretation None      MDM   Final diagnoses:  Dementia, without behavioral disturbance    Will treat empirically for possible uti.  Pt to follow up with pcp in 2-3    Milton Ferguson, MD 01/03/15 2024

## 2015-01-03 NOTE — Discharge Instructions (Signed)
Follow up with your md this week. Start the antibiotics tomorrow

## 2015-01-03 NOTE — ED Notes (Signed)
Dr. Roderic Palau in room with patient

## 2015-01-04 ENCOUNTER — Telehealth (HOSPITAL_BASED_OUTPATIENT_CLINIC_OR_DEPARTMENT_OTHER): Payer: Self-pay | Admitting: Emergency Medicine

## 2015-01-05 LAB — URINE CULTURE: Colony Count: 25000

## 2015-03-05 ENCOUNTER — Inpatient Hospital Stay (HOSPITAL_COMMUNITY)
Admission: EM | Admit: 2015-03-05 | Discharge: 2015-03-08 | DRG: 641 | Disposition: A | Discharge status: Home Health | Payer: Medicare Other | Attending: Pulmonary Disease | Admitting: Pulmonary Disease

## 2015-03-05 ENCOUNTER — Emergency Department (HOSPITAL_COMMUNITY): Payer: Medicare Other

## 2015-03-05 ENCOUNTER — Encounter (HOSPITAL_COMMUNITY): Payer: Self-pay | Admitting: Emergency Medicine

## 2015-03-05 DIAGNOSIS — F0280 Dementia in other diseases classified elsewhere without behavioral disturbance: Secondary | ICD-10-CM | POA: Diagnosis present

## 2015-03-05 DIAGNOSIS — Z66 Do not resuscitate: Secondary | ICD-10-CM | POA: Diagnosis present

## 2015-03-05 DIAGNOSIS — R410 Disorientation, unspecified: Secondary | ICD-10-CM

## 2015-03-05 DIAGNOSIS — N390 Urinary tract infection, site not specified: Secondary | ICD-10-CM | POA: Diagnosis not present

## 2015-03-05 DIAGNOSIS — I471 Supraventricular tachycardia: Secondary | ICD-10-CM | POA: Diagnosis present

## 2015-03-05 DIAGNOSIS — Z833 Family history of diabetes mellitus: Secondary | ICD-10-CM | POA: Diagnosis not present

## 2015-03-05 DIAGNOSIS — Z85820 Personal history of malignant melanoma of skin: Secondary | ICD-10-CM | POA: Diagnosis not present

## 2015-03-05 DIAGNOSIS — E785 Hyperlipidemia, unspecified: Secondary | ICD-10-CM | POA: Diagnosis present

## 2015-03-05 DIAGNOSIS — R5383 Other fatigue: Secondary | ICD-10-CM

## 2015-03-05 DIAGNOSIS — G309 Alzheimer's disease, unspecified: Secondary | ICD-10-CM | POA: Diagnosis present

## 2015-03-05 DIAGNOSIS — I4891 Unspecified atrial fibrillation: Secondary | ICD-10-CM | POA: Diagnosis present

## 2015-03-05 DIAGNOSIS — E119 Type 2 diabetes mellitus without complications: Secondary | ICD-10-CM | POA: Diagnosis present

## 2015-03-05 DIAGNOSIS — E039 Hypothyroidism, unspecified: Secondary | ICD-10-CM | POA: Diagnosis present

## 2015-03-05 DIAGNOSIS — Z803 Family history of malignant neoplasm of breast: Secondary | ICD-10-CM

## 2015-03-05 DIAGNOSIS — R7989 Other specified abnormal findings of blood chemistry: Secondary | ICD-10-CM

## 2015-03-05 DIAGNOSIS — R079 Chest pain, unspecified: Secondary | ICD-10-CM | POA: Diagnosis not present

## 2015-03-05 DIAGNOSIS — I10 Essential (primary) hypertension: Secondary | ICD-10-CM | POA: Diagnosis present

## 2015-03-05 DIAGNOSIS — Z515 Encounter for palliative care: Secondary | ICD-10-CM | POA: Insufficient documentation

## 2015-03-05 DIAGNOSIS — R531 Weakness: Secondary | ICD-10-CM | POA: Diagnosis not present

## 2015-03-05 DIAGNOSIS — E860 Dehydration: Principal | ICD-10-CM

## 2015-03-05 DIAGNOSIS — R339 Retention of urine, unspecified: Secondary | ICD-10-CM | POA: Diagnosis present

## 2015-03-05 DIAGNOSIS — Z8551 Personal history of malignant neoplasm of bladder: Secondary | ICD-10-CM | POA: Diagnosis not present

## 2015-03-05 LAB — URINALYSIS, ROUTINE W REFLEX MICROSCOPIC
BILIRUBIN URINE: NEGATIVE
GLUCOSE, UA: NEGATIVE mg/dL
HGB URINE DIPSTICK: NEGATIVE
NITRITE: NEGATIVE
PH: 6 (ref 5.0–8.0)
Protein, ur: NEGATIVE mg/dL
SPECIFIC GRAVITY, URINE: 1.025 (ref 1.005–1.030)
Urobilinogen, UA: 0.2 mg/dL (ref 0.0–1.0)

## 2015-03-05 LAB — COMPREHENSIVE METABOLIC PANEL
ALK PHOS: 68 U/L (ref 38–126)
ALT: 32 U/L (ref 14–54)
AST: 34 U/L (ref 15–41)
Albumin: 3.5 g/dL (ref 3.5–5.0)
Anion gap: 9 (ref 5–15)
BUN: 15 mg/dL (ref 6–20)
CALCIUM: 9.1 mg/dL (ref 8.9–10.3)
CO2: 26 mmol/L (ref 22–32)
Chloride: 101 mmol/L (ref 101–111)
Creatinine, Ser: 0.97 mg/dL (ref 0.44–1.00)
GFR calc non Af Amer: 53 mL/min — ABNORMAL LOW (ref >60)
GLUCOSE: 168 mg/dL — AB (ref 65–99)
Potassium: 4.4 mmol/L (ref 3.5–5.1)
SODIUM: 136 mmol/L (ref 135–145)
Total Bilirubin: 0.7 mg/dL (ref 0.3–1.2)
Total Protein: 6.7 g/dL (ref 6.5–8.1)

## 2015-03-05 LAB — CBC WITH DIFFERENTIAL/PLATELET
BASOS ABS: 0 10*3/uL (ref 0.0–0.1)
Basophils Relative: 0 % (ref 0–1)
Eosinophils Absolute: 0 10*3/uL (ref 0.0–0.7)
Eosinophils Relative: 1 % (ref 0–5)
HEMATOCRIT: 37.7 % (ref 36.0–46.0)
Hemoglobin: 12.5 g/dL (ref 12.0–15.0)
LYMPHS ABS: 1.5 10*3/uL (ref 0.7–4.0)
Lymphocytes Relative: 18 % (ref 12–46)
MCH: 29.6 pg (ref 26.0–34.0)
MCHC: 33.2 g/dL (ref 30.0–36.0)
MCV: 89.3 fL (ref 78.0–100.0)
Monocytes Absolute: 0.5 10*3/uL (ref 0.1–1.0)
Monocytes Relative: 6 % (ref 3–12)
Neutro Abs: 6.4 10*3/uL (ref 1.7–7.7)
Neutrophils Relative %: 75 % (ref 43–77)
Platelets: 170 10*3/uL (ref 150–400)
RBC: 4.22 MIL/uL (ref 3.87–5.11)
RDW: 14.3 % (ref 11.5–15.5)
WBC: 8.4 10*3/uL (ref 4.0–10.5)

## 2015-03-05 LAB — TSH: TSH: 2.412 u[IU]/mL (ref 0.350–4.500)

## 2015-03-05 LAB — TROPONIN I
TROPONIN I: 0.06 ng/mL — AB (ref <0.031)
Troponin I: 0.04 ng/mL — ABNORMAL HIGH (ref <0.031)
Troponin I: 0.03 ng/mL (ref <0.031)

## 2015-03-05 LAB — URINE MICROSCOPIC-ADD ON

## 2015-03-05 LAB — GLUCOSE, CAPILLARY: GLUCOSE-CAPILLARY: 131 mg/dL — AB (ref 65–99)

## 2015-03-05 MED ORDER — LOSARTAN POTASSIUM 50 MG PO TABS
100.0000 mg | ORAL_TABLET | Freq: Every day | ORAL | Status: DC
  Administered 2015-03-06 – 2015-03-08 (×3): 100 mg via ORAL
  Filled 2015-03-05 (×3): qty 2

## 2015-03-05 MED ORDER — SODIUM CHLORIDE 0.9 % IV BOLUS (SEPSIS)
500.0000 mL | Freq: Once | INTRAVENOUS | Status: AC
  Administered 2015-03-05: 500 mL via INTRAVENOUS

## 2015-03-05 MED ORDER — CITALOPRAM HYDROBROMIDE 20 MG PO TABS
10.0000 mg | ORAL_TABLET | Freq: Every day | ORAL | Status: DC
  Administered 2015-03-06 – 2015-03-08 (×3): 10 mg via ORAL
  Filled 2015-03-05 (×3): qty 1

## 2015-03-05 MED ORDER — SODIUM CHLORIDE 0.9 % IV SOLN
INTRAVENOUS | Status: DC
  Administered 2015-03-05 – 2015-03-07 (×3): via INTRAVENOUS

## 2015-03-05 MED ORDER — CEFTRIAXONE SODIUM 1 G IJ SOLR
1.0000 g | Freq: Once | INTRAMUSCULAR | Status: AC
  Administered 2015-03-05: 1 g via INTRAVENOUS
  Filled 2015-03-05: qty 10

## 2015-03-05 MED ORDER — LEVOTHYROXINE SODIUM 112 MCG PO TABS
112.0000 ug | ORAL_TABLET | Freq: Every day | ORAL | Status: DC
  Administered 2015-03-06 – 2015-03-08 (×3): 112 ug via ORAL
  Filled 2015-03-05 (×3): qty 1

## 2015-03-05 MED ORDER — LORAZEPAM 1 MG PO TABS
1.0000 mg | ORAL_TABLET | Freq: Two times a day (BID) | ORAL | Status: DC | PRN
  Administered 2015-03-05 – 2015-03-06 (×2): 1 mg via ORAL
  Filled 2015-03-05 (×2): qty 1

## 2015-03-05 MED ORDER — DONEPEZIL HCL 5 MG PO TABS
10.0000 mg | ORAL_TABLET | Freq: Every day | ORAL | Status: DC
  Administered 2015-03-05 – 2015-03-07 (×3): 10 mg via ORAL
  Filled 2015-03-05 (×3): qty 2

## 2015-03-05 MED ORDER — INSULIN ASPART 100 UNIT/ML ~~LOC~~ SOLN: 0.0000 [IU] | Freq: Every day | SUBCUTANEOUS | Status: DC

## 2015-03-05 MED ORDER — ACETAMINOPHEN 650 MG RE SUPP: 650.0000 mg | Freq: Four times a day (QID) | RECTAL | Status: DC | PRN

## 2015-03-05 MED ORDER — ONDANSETRON HCL 4 MG/2ML IJ SOLN: 4.0000 mg | Freq: Four times a day (QID) | INTRAMUSCULAR | Status: DC | PRN

## 2015-03-05 MED ORDER — ASPIRIN EC 81 MG PO TBEC
81.0000 mg | DELAYED_RELEASE_TABLET | Freq: Every day | ORAL | Status: DC
  Administered 2015-03-06 – 2015-03-08 (×3): 81 mg via ORAL
  Filled 2015-03-05 (×3): qty 1

## 2015-03-05 MED ORDER — CEFTRIAXONE SODIUM IN DEXTROSE 20 MG/ML IV SOLN
1.0000 g | INTRAVENOUS | Status: DC
  Filled 2015-03-05 (×2): qty 50

## 2015-03-05 MED ORDER — RISAQUAD PO CAPS
1.0000 | ORAL_CAPSULE | Freq: Every day | ORAL | Status: DC
  Administered 2015-03-06 – 2015-03-08 (×3): 1 via ORAL
  Filled 2015-03-05 (×3): qty 1

## 2015-03-05 MED ORDER — ENOXAPARIN SODIUM 40 MG/0.4ML ~~LOC~~ SOLN
40.0000 mg | SUBCUTANEOUS | Status: DC
  Administered 2015-03-05 – 2015-03-07 (×3): 40 mg via SUBCUTANEOUS
  Filled 2015-03-05 (×3): qty 0.4

## 2015-03-05 MED ORDER — INSULIN ASPART 100 UNIT/ML ~~LOC~~ SOLN
0.0000 [IU] | Freq: Three times a day (TID) | SUBCUTANEOUS | Status: DC
  Administered 2015-03-06 – 2015-03-07 (×4): 1 [IU] via SUBCUTANEOUS

## 2015-03-05 MED ORDER — SODIUM CHLORIDE 0.9 % IV SOLN
Freq: Once | INTRAVENOUS | Status: AC
  Administered 2015-03-05: 11:00:00 via INTRAVENOUS

## 2015-03-05 MED ORDER — ATORVASTATIN CALCIUM 20 MG PO TABS
20.0000 mg | ORAL_TABLET | Freq: Every day | ORAL | Status: DC
  Administered 2015-03-05 – 2015-03-07 (×3): 20 mg via ORAL
  Filled 2015-03-05 (×3): qty 1

## 2015-03-05 MED ORDER — GABAPENTIN 100 MG PO CAPS
100.0000 mg | ORAL_CAPSULE | Freq: Every day | ORAL | Status: DC
  Administered 2015-03-05 – 2015-03-07 (×3): 100 mg via ORAL
  Filled 2015-03-05 (×3): qty 1

## 2015-03-05 MED ORDER — SODIUM CHLORIDE 0.9 % IJ SOLN: 3.0000 mL | Freq: Two times a day (BID) | INTRAMUSCULAR | Status: DC

## 2015-03-05 MED ORDER — ALLOPURINOL 100 MG PO TABS
100.0000 mg | ORAL_TABLET | Freq: Every day | ORAL | Status: DC
  Administered 2015-03-06 – 2015-03-08 (×3): 100 mg via ORAL
  Filled 2015-03-05 (×3): qty 1

## 2015-03-05 MED ORDER — RISPERIDONE 0.5 MG PO TABS
0.5000 mg | ORAL_TABLET | Freq: Two times a day (BID) | ORAL | Status: DC
  Administered 2015-03-05 – 2015-03-08 (×6): 0.5 mg via ORAL
  Filled 2015-03-05 (×6): qty 1

## 2015-03-05 MED ORDER — ONDANSETRON HCL 4 MG PO TABS: 4.0000 mg | ORAL_TABLET | Freq: Four times a day (QID) | ORAL | Status: DC | PRN

## 2015-03-05 MED ORDER — ACETAMINOPHEN 325 MG PO TABS
650.0000 mg | ORAL_TABLET | Freq: Four times a day (QID) | ORAL | Status: DC | PRN
  Administered 2015-03-07 – 2015-03-08 (×3): 650 mg via ORAL
  Filled 2015-03-05 (×2): qty 2

## 2015-03-05 MED ORDER — MEMANTINE HCL 10 MG PO TABS
10.0000 mg | ORAL_TABLET | Freq: Two times a day (BID) | ORAL | Status: DC
  Administered 2015-03-05 – 2015-03-08 (×6): 10 mg via ORAL
  Filled 2015-03-05 (×6): qty 1

## 2015-03-05 NOTE — H&P (Addendum)
[facility] History and Physical  [name] CBJ:[id number] DOB: [birth year] DOA: 03/05/2015  Referring physician: Tanna Furry, MD PCP: Alonza Bogus, MD   Chief Complaint: increased confusion and weakness   HPI: [name] is a 81 y.o. female  Hx limited due to dementia.  Daughter reports pt has not urinated since 10:30pm last night. She reports that the nursing home had informed her that today the patient was hollering and crawling on the floor. Daughter reports she able to ambulate unassisted, but has not been talking as much lately. She is only able to recognize her daughter, great granddaughter, son, and daughter-in-law. She cannot dress herself, but can somewhat feed herself. Daughter is concerned that pt may be on too many sedative medications throughout the day. She reports that pt has been increasingly weak lately, somnolent as far as the daughter knows, her po intake has been adequate. Daughter is unaware if patient has had any fever, vomiting, diarrhea, cough or shortness of breath.    Today daughter reports pt informed her she was tired, but not hurting anywhere when awake.    In the ER she was noted to be lethargic but responds to questions. Concerns for dehydration and UTI, so she was referred for admission.   Review of Systems:  Could not access due to dementia/Alheimer's.  Past Medical History  Diagnosis Date  . Thyroid disease   . Hypertension   . Gout   . Dementia   . Vertigo   . Low back pain   . Hypothyroidism   . Arthritis   . Hyperlipidemia   . Hemorrhoids with complication   . Cancer 2012    Melanoma-Face Removed at [facility]  . Bladder cancer     saw dr. Rosana Hoes had surgery had follow about 3 years ago  . Atrial fibrillation   . Migraine   . Depression   . Hyperglycemia   . Alzheimer disease    Past Surgical History  Procedure Laterality Date  . Cholecystectomy    . Colonoscopy  11/21/09    Simple adenoma/pancolonic  diverticulosis(next on due in 5/10 years)  . Eye surgery      Bilateral Cataract Surgery  . Flexible sigmoidoscopy  12/03/2011    Procedure: FLEXIBLE SIGMOIDOSCOPY;  Surgeon: Danie Binder, MD;  [facility];  Service: Endoscopy;  Laterality: N/A;  12:00  . Bladder surgery      remove cancer  . Melanoma excision      on face   Social History:  reports that she has never smoked. She has never used smokeless tobacco. She reports that she does not drink alcohol or use illicit drugs.  Allergies  Allergen Reactions  . Sulfonamide Derivatives Anaphylaxis, Swelling and Rash    Family History  Problem Relation Age of Onset  . Migraines Mother   . Arthritis Father   . Diabetes Father   . Cancer Brother   . Diabetes Brother   . Breast cancer Daughter   . Diabetes Brother     Prior to Admission medications   Medication Sig Start Date End Date Taking? Authorizing Provider  acetaminophen (TYLENOL) 500 MG tablet Take 500-1,000 mg by mouth every 6 (six) hours as needed (pain(Max 5 tablets per 24hours)).     Historical Provider, MD  allopurinol (ZYLOPRIM) 100 MG tablet TAKE ONE TABLET BY MOUTH ONCE DAILY. 03/24/13   Kathyrn Drown, MD  amitriptyline (ELAVIL) 75 MG tablet TAKE (1) TABLET BY MOUTH AT BEDTIME. 06/22/13   Scott A  Luking, MD  atorvastatin (LIPITOR) 20 MG tablet TAKE (1) TABLET BY MOUTH AT BEDTIME. 03/24/13   Kathyrn Drown, MD  cefUROXime (CEFTIN) 250 MG tablet Take 1 tablet (250 mg total) by mouth 2 (two) times daily with a meal. Patient not taking: Reported on 01/03/2015 09/20/14   Sinda Du, MD  ciprofloxacin (CIPRO) 250 MG tablet Take 2 tablets (500 mg total) by mouth 2 (two) times daily. One po bid x 7 days 01/03/15   Milton Ferguson, MD  citalopram (CELEXA) 10 MG tablet TAKE ONE TABLET BY MOUTH ONCE DAILY. 03/24/13   Kathyrn Drown, MD  donepezil (ARICEPT) 10 MG tablet Take 10 mg by mouth at bedtime.    Historical Provider, MD  gabapentin (NEURONTIN) 100 MG capsule  Take 1 capsule (100 mg total) by mouth at bedtime. 09/20/14   Sinda Du, MD  Lactobacillus (ACIDOPHILUS) TABS TAKE ONE TABLET BY MOUTH ONCE DAILY. 06/22/13   Kathyrn Drown, MD  levothyroxine (SYNTHROID, LEVOTHROID) 112 MCG tablet Take 1 tablet (112 mcg total) by mouth daily. 11/03/13   Kathyrn Drown, MD  linagliptin (TRADJENTA) 5 MG TABS tablet Take 5 mg by mouth daily.    Historical Provider, MD  LORazepam (ATIVAN) 2 MG tablet Take 1 mg by mouth 5 (five) times daily. As needed for anxiety    Historical Provider, MD  losartan-hydrochlorothiazide (HYZAAR) 100-12.5 MG per tablet TAKE ONE TABLET BY MOUTH ONCE DAILY. 03/24/13   Kathyrn Drown, MD  Multiple Vitamins-Minerals (PRESERVISION/LUTEIN PO) Take 1 tablet by mouth 2 (two) times daily.     Historical Provider, MD  NAMENDA 10 MG tablet TAKE (1) TABLET BY MOUTH TWICE DAILY. 03/24/13   Kathyrn Drown, MD  nystatin-triamcinolone (MYCOLOG II) cream Apply 1 application topically 2 (two) times daily as needed (rash).  12/11/12   Historical Provider, MD  risperiDONE (RISPERDAL) 0.5 MG tablet Take 0.5 mg by mouth 2 (two) times daily as needed.    Historical Provider, MD  traZODone (DESYREL) 50 MG tablet Take 25 mg by mouth at bedtime.    Historical Provider, MD   Physical Exam: Filed Vitals:   03/05/15 1130 03/05/15 1200 03/05/15 1300 03/05/15 1330  BP: 103/59 114/65 109/68 118/64  Pulse: 68 68 59 63  Temp:      TempSrc:      Resp: 12 12 13 14   Height:      Weight:      SpO2: 95% 98% 96% 96%    Wt Readings from Last 3 Encounters:  03/05/15 78.472 kg (173 lb)  01/03/15 78.557 kg (173 lb 3 oz)  09/16/14 80.8 kg (178 lb 2.1 oz)    General:  Resting comfortably on exam, arousable. VSS.  Eyes: PERRL, normal lids, irises & conjunctiva ENT: dry mucous membranes Neck: no LAD, masses or thyromegaly Cardiovascular: RRR, no m/r/g. No LE edema. Telemetry: SR, no arrhythmias  Respiratory: CTA bilaterally, no w/r/r. Normal respiratory  effort. Abdomen: soft, ntnd Skin: no rash or induration seen on limited exam Musculoskeletal: grossly normal tone BUE/BLE Psychiatric: could not assess due to lethargy  Neurologic: grossly non-focal. Follows commands. Disoriented to place.           Labs on Admission:  Basic Metabolic Panel:  Recent Labs Lab 03/05/15 1113  NA 136  K 4.4  CL 101  CO2 26  GLUCOSE 168*  BUN 15  CREATININE 0.97  CALCIUM 9.1   Liver Function Tests:  Recent Labs Lab 03/05/15 1113  AST 34  ALT 32  ALKPHOS 68  BILITOT 0.7  PROT 6.7  ALBUMIN 3.5   No results for input(s): LIPASE, AMYLASE in the last 168 hours. No results for input(s): AMMONIA in the last 168 hours. CBC:  Recent Labs Lab 03/05/15 1113  WBC 8.4  NEUTROABS 6.4  HGB 12.5  HCT 37.7  MCV 89.3  PLT 170   Cardiac Enzymes:  Recent Labs Lab 03/05/15 1113  TROPONINI 0.06*    BNP (last 3 results) No results for input(s): BNP in the last 8760 hours.  ProBNP (last 3 results) No results for input(s): PROBNP in the last 8760 hours.  CBG: No results for input(s): GLUCAP in the last 168 hours.  Radiological Exams on Admission: Dg Chest Port 1 View  03/05/2015   CLINICAL DATA:  81 year old female with a history of confusion.  EXAM: PORTABLE CHEST - 1 VIEW  COMPARISON:  01/03/2015, 10/24/2013, 12/28/2012  FINDINGS: Cardiomediastinal silhouette unchanged. Tortuosity descending thoracic aorta.  Lung volumes are low. No pneumothorax or confluent airspace disease. Chronic interstitial opacities. No displaced fracture.  IMPRESSION: Similar appearance of chronic lung changes with no evidence of acute cardiopulmonary disease.  Signed,  Dulcy Fanny. Earleen Newport, DO  [facility]  [facility]   Electronically Signed   By: Corrie Mckusick D.O.   On: 03/05/2015 11:54    EKG: Independently reviewed. Sinus rhythm, possible T- wave inversion in Lead III and flattening ST waves (chronic) in lateral  leads.   Assessment/Plan Active Problems:   Weakness  1. Dehydration. Likely related to decreased PO intake. Will gently hydrate with IVF. Hold HCTZ 2. Possible UTI. UA indicates possible UTI. Will send UC. Start on Rocephin.  3. Elevated Troponin. No c/o chest pain and EKG does not show convincing evidence of ischemia. Cycle troponin and monitor on telemetry.   4. Generalized weakness. likley related to dehydration. Will provide hydration and consider PT consult, if she does not return to baseline.  5. Alzheimer's/dementia. Advanced, progressive. Continue Namenda and aricept.   6. Hypertension. Stable. Continue losartan, but hold HCTZ   7. Hyperlipidemia.  Continue outpatient regimen.   8. DM. Sliding scale insulin.  Hold linagliptin while admitted.   9. Hypothyroidism.  Continue outpatient regimen. Check TSH  10. Lethargy. Likely related to dehydration and possible UTI. Although polypharmacy can be playing a role. Will hold sedative medications for now. Family has requested to readdress these medication prior to discharge.    Code Status: Full code for now, but family is discussing DNR status  DVT Prophylaxis: Lovenox  Family Communication: daughter (POA in the room and discussed care plan in detail with her. She understands the plan at this time.  Disposition Plan: discharge back to home care once improved.    Time spent: 60 minutes   I have reviewed the above documentation for accuracy and completeness, and I agree with the above.  Casandra Dallaire    Kathie Dike, M.D. [facility] Pager [phone]  I, Norg'e Tisdol, acting a scribe, recorded this note contemporaneously in the presence of Dr. Kathie Dike, M.D. on 03/05/2015 at 1:59 PM

## 2015-03-05 NOTE — ED Notes (Signed)
Pt daughter, Liz Malady, [phone].

## 2015-03-05 NOTE — ED Notes (Signed)
Daughter states that pt has not urinated since 2230 last night.  Has not been drinking very much since 7/22.  Pt is from [facility] and they report that pt has been crawling in floor and acting more confused.

## 2015-03-05 NOTE — ED Provider Notes (Addendum)
CSN: [id number]     Arrival date & time 03/05/15  1036 History  This chart was scribed for Tanna Furry, MD by Hansel Feinstein, ED Scribe. This patient was seen in room APA17/AP[facility]17 and the patient's care was started at 10:50 AM.     Chief Complaint  Patient presents with  . Urinary Retention   The history is provided by the patient and a relative. No language interpreter was used.   HPI Comments: [name] is a 81 y.o. female with Hx of bladder cancer, HTN, hypothyroidism, Afib, hyperglycemia, dementia, Alzheimer's disease who presents to the Emergency Department complaining of urinary retention that began at 2230 last night. The daughter states associated recent increased confusion onset 2 years ago and worsened today when she was picked up. Pt's daughter states that she stays at [facility] and has not been urinating much for 8 days. She notes that a urine sample was sent off 2 days ago by Dr. Luan Pulling and have not gotten the results back yet. She also notes that per CNA at the care center, she has been having trouble sleeping recently. The daughter states a new mood stabilizer recently, but is not sure when it was administered initially. Pt denies nausea, urgency.   Past Medical History  Diagnosis Date  . Thyroid disease   . Hypertension   . Gout   . Dementia   . Vertigo   . Low back pain   . Hypothyroidism   . Arthritis   . Hyperlipidemia   . Hemorrhoids with complication   . Cancer 2012    Melanoma-Face Removed at [facility]  . Bladder cancer     saw dr. Rosana Hoes had surgery had follow about 3 years ago  . Atrial fibrillation   . Migraine   . Depression   . Hyperglycemia   . Alzheimer disease    Past Surgical History  Procedure Laterality Date  . Cholecystectomy    . Colonoscopy  11/21/09    Simple adenoma/pancolonic diverticulosis(next on due in 5/10 years)  . Eye surgery      Bilateral Cataract Surgery  . Flexible sigmoidoscopy  12/03/2011    Procedure: FLEXIBLE  SIGMOIDOSCOPY;  Surgeon: Danie Binder, MD;  Location: [facility];  Service: Endoscopy;  Laterality: N/A;  12:00  . Bladder surgery      remove cancer  . Melanoma excision      on face   Family History  Problem Relation Age of Onset  . Migraines Mother   . Arthritis Father   . Diabetes Father   . Cancer Brother   . Diabetes Brother   . Breast cancer Daughter   . Diabetes Brother    History  Substance Use Topics  . Smoking status: Never Smoker   . Smokeless tobacco: Never Used  . Alcohol Use: No   OB History    No data available     Review of Systems  Constitutional: Negative for fever, chills, diaphoresis, appetite change and fatigue.  HENT: Negative for mouth sores, sore throat and trouble swallowing.   Eyes: Negative for visual disturbance.  Respiratory: Negative for cough, chest tightness, shortness of breath and wheezing.   Cardiovascular: Negative for chest pain.  Gastrointestinal: Negative for nausea, vomiting, abdominal pain, diarrhea and abdominal distention.  Endocrine: Negative for polydipsia, polyphagia and polyuria.  Genitourinary: Positive for decreased urine volume. Negative for dysuria, urgency, frequency and hematuria.  Musculoskeletal: Negative for gait problem.  Skin: Negative for color change, pallor and rash.  Neurological: Negative for dizziness, syncope, light-headedness and headaches.  Hematological: Does not bruise/bleed easily.  Psychiatric/Behavioral: Positive for confusion and sleep disturbance. Negative for behavioral problems.   Allergies  Sulfonamide derivatives  Home Medications   Prior to Admission medications   Medication Sig Start Date End Date Taking? Authorizing Provider  acetaminophen (TYLENOL) 500 MG tablet Take 500-1,000 mg by mouth every 6 (six) hours as needed (pain(Max 5 tablets per 24hours)).     Historical Provider, MD  allopurinol (ZYLOPRIM) 100 MG tablet TAKE ONE TABLET BY MOUTH ONCE DAILY. 03/24/13   Kathyrn Drown, MD   amitriptyline (ELAVIL) 75 MG tablet TAKE (1) TABLET BY MOUTH AT BEDTIME. 06/22/13   Kathyrn Drown, MD  atorvastatin (LIPITOR) 20 MG tablet TAKE (1) TABLET BY MOUTH AT BEDTIME. 03/24/13   Kathyrn Drown, MD  cefUROXime (CEFTIN) 250 MG tablet Take 1 tablet (250 mg total) by mouth 2 (two) times daily with a meal. Patient not taking: Reported on 01/03/2015 09/20/14   Sinda Du, MD  ciprofloxacin (CIPRO) 250 MG tablet Take 2 tablets (500 mg total) by mouth 2 (two) times daily. One po bid x 7 days 01/03/15   Milton Ferguson, MD  citalopram (CELEXA) 10 MG tablet TAKE ONE TABLET BY MOUTH ONCE DAILY. 03/24/13   Kathyrn Drown, MD  donepezil (ARICEPT) 10 MG tablet Take 10 mg by mouth at bedtime.    Historical Provider, MD  gabapentin (NEURONTIN) 100 MG capsule Take 1 capsule (100 mg total) by mouth at bedtime. 09/20/14   Sinda Du, MD  Lactobacillus (ACIDOPHILUS) TABS TAKE ONE TABLET BY MOUTH ONCE DAILY. 06/22/13   Kathyrn Drown, MD  levothyroxine (SYNTHROID, LEVOTHROID) 112 MCG tablet Take 1 tablet (112 mcg total) by mouth daily. 11/03/13   Kathyrn Drown, MD  linagliptin (TRADJENTA) 5 MG TABS tablet Take 5 mg by mouth daily.    Historical Provider, MD  LORazepam (ATIVAN) 2 MG tablet Take 1 mg by mouth 5 (five) times daily. As needed for anxiety    Historical Provider, MD  losartan-hydrochlorothiazide (HYZAAR) 100-12.5 MG per tablet TAKE ONE TABLET BY MOUTH ONCE DAILY. 03/24/13   Kathyrn Drown, MD  Multiple Vitamins-Minerals (PRESERVISION/LUTEIN PO) Take 1 tablet by mouth 2 (two) times daily.     Historical Provider, MD  NAMENDA 10 MG tablet TAKE (1) TABLET BY MOUTH TWICE DAILY. 03/24/13   Kathyrn Drown, MD  nystatin-triamcinolone (MYCOLOG II) cream Apply 1 application topically 2 (two) times daily as needed (rash).  12/11/12   Historical Provider, MD  risperiDONE (RISPERDAL) 0.5 MG tablet Take 0.5 mg by mouth 2 (two) times daily as needed.    Historical Provider, MD  traZODone (DESYREL) 50 MG tablet Take  25 mg by mouth at bedtime.    Historical Provider, MD   BP 130/59 mmHg  Pulse 70  Temp(Src) 97.7 F (36.5 C) (Oral)  Resp 22  Ht 5\' 3"  (1.6 m)  Wt 173 lb (78.472 kg)  BMI 30.65 kg/m2  SpO2 96% Physical Exam  Constitutional: She appears well-developed and well-nourished. No distress.  HENT:  Head: Normocephalic.  Eyes: Conjunctivae are normal. Pupils are equal, round, and reactive to light. No scleral icterus.  Neck: Normal range of motion. Neck supple. No thyromegaly present.  Cardiovascular: Normal rate and regular rhythm.  Exam reveals no gallop and no friction rub.   No murmur heard. Pulmonary/Chest: Effort normal and breath sounds normal. No respiratory distress. She has no wheezes. She has no rales.  Abdominal: Soft.  Bowel sounds are normal. She exhibits no distension. There is no tenderness. There is no rebound.  Musculoskeletal: Normal range of motion.  Neurological:  Prefers to sleep. Her eyes. Will say one or 2 word answers. Not oriented to place or time. Marked generalized weakness without focal deficits.  Skin: Skin is warm and dry. No rash noted.  Nursing note and vitals reviewed.  ED Course  Procedures (including critical care time) DIAGNOSTIC STUDIES: Oxygen Saturation is 96% on RA, normal by my interpretation.    COORDINATION OF CARE: 10:56 AM Discussed treatment plan with pt at bedside and pt agreed to plan.   Labs Review Labs Reviewed  COMPREHENSIVE METABOLIC PANEL - Abnormal; Notable for the following:    Glucose, Bld 168 (*)    GFR calc non Af Amer 53 (*)    All other components within normal limits  URINALYSIS, ROUTINE W REFLEX MICROSCOPIC (NOT AT [facility]) - Abnormal; Notable for the following:    APPearance CLOUDY (*)    Ketones, ur TRACE (*)    Leukocytes, UA TRACE (*)    All other components within normal limits  TROPONIN I - Abnormal; Notable for the following:    Troponin I 0.06 (*)    All other components within normal limits  URINE  MICROSCOPIC-ADD ON - Abnormal; Notable for the following:    Squamous Epithelial / LPF FEW (*)    Bacteria, UA MANY (*)    All other components within normal limits  URINE CULTURE  CBC WITH DIFFERENTIAL/PLATELET    Imaging Review Dg Chest Port 1 View  03/05/2015   CLINICAL DATA:  81 year old female with a history of confusion.  EXAM: PORTABLE CHEST - 1 VIEW  COMPARISON:  01/03/2015, 10/24/2013, 12/28/2012  FINDINGS: Cardiomediastinal silhouette unchanged. Tortuosity descending thoracic aorta.  Lung volumes are low. No pneumothorax or confluent airspace disease. Chronic interstitial opacities. No displaced fracture.  IMPRESSION: Similar appearance of chronic lung changes with no evidence of acute cardiopulmonary disease.  Signed,  Dulcy Fanny. Earleen Newport, DO  [facility]  [facility]   Electronically Signed   By: Corrie Mckusick D.O.   On: 03/05/2015 11:54     EKG Interpretation   Date/Time:  Saturday March 05 2015 10:49:35 EDT Ventricular Rate:  67 PR Interval:  159 QRS Duration: 96 QT Interval:  381 QTC Calculation: 402 R Axis:   43 Text Interpretation:  Sinus rhythm Borderline T wave abnormalities  Confirmed by Jeneen Rinks  MD, Omao ([id number]) on 03/05/2015 12:24:22 PM      MDM   Final diagnoses:  Confusion  UTI (lower urinary tract infection)  Weakness   Patient not febrile or hypoxemic. Urine shows wbc's and bacteria. Although nitrite negative. Culture pending. EKG without acute changes. Troponin 0.06.  I have discussed the patient with her family that accompanies her here. She is requiring assistance of 2 to transfer to a bedside commode due to generalized weakness. This is a change for her. Family states that at times she will need minimal assistance with walking long distances. Apparently a family member of the patient "runs" the facility she resides at there was concern about her level of weakness and confusion. Placed a call hospitalist  regarding admission. Urine culture is pending. She has been given IV Rocephin.   I personally performed the services described in this documentation, which was scribed in my presence. The recorded information has been reviewed and is accurate.   Tanna Furry, MD 03/05/15 1304  Tanna Furry, MD 03/05/15  1333 

## 2015-03-05 NOTE — ED Notes (Signed)
RN attempted to call daughter, no answer.

## 2015-03-06 LAB — CBC
HCT: 31.5 % — ABNORMAL LOW (ref 36.0–46.0)
Hemoglobin: 10.5 g/dL — ABNORMAL LOW (ref 12.0–15.0)
MCH: 29.9 pg (ref 26.0–34.0)
MCHC: 33.3 g/dL (ref 30.0–36.0)
MCV: 89.7 fL (ref 78.0–100.0)
PLATELETS: 149 10*3/uL — AB (ref 150–400)
RBC: 3.51 MIL/uL — ABNORMAL LOW (ref 3.87–5.11)
RDW: 14.3 % (ref 11.5–15.5)
WBC: 5.5 10*3/uL (ref 4.0–10.5)

## 2015-03-06 LAB — GLUCOSE, CAPILLARY
GLUCOSE-CAPILLARY: 133 mg/dL — AB (ref 65–99)
Glucose-Capillary: 105 mg/dL — ABNORMAL HIGH (ref 65–99)
Glucose-Capillary: 126 mg/dL — ABNORMAL HIGH (ref 65–99)
Glucose-Capillary: 126 mg/dL — ABNORMAL HIGH (ref 65–99)

## 2015-03-06 LAB — BASIC METABOLIC PANEL
Anion gap: 6 (ref 5–15)
BUN: 15 mg/dL (ref 6–20)
CALCIUM: 8.4 mg/dL — AB (ref 8.9–10.3)
CHLORIDE: 106 mmol/L (ref 101–111)
CO2: 26 mmol/L (ref 22–32)
CREATININE: 0.82 mg/dL (ref 0.44–1.00)
GFR calc non Af Amer: >60 mL/min (ref >60)
Glucose, Bld: 121 mg/dL — ABNORMAL HIGH (ref 65–99)
Potassium: 3.6 mmol/L (ref 3.5–5.1)
Sodium: 138 mmol/L (ref 135–145)

## 2015-03-06 LAB — TROPONIN I: Troponin I: <0.03 ng/mL (ref <0.031)

## 2015-03-06 MED ORDER — DEXTROSE 5 % IV SOLN
1.0000 g | INTRAVENOUS | Status: DC
  Administered 2015-03-06 – 2015-03-07 (×2): 1 g via INTRAVENOUS
  Filled 2015-03-06 (×4): qty 10

## 2015-03-06 MED ORDER — LORAZEPAM 2 MG/ML IJ SOLN
0.5000 mg | INTRAMUSCULAR | Status: DC | PRN
  Administered 2015-03-06: 0.5 mg via INTRAVENOUS
  Filled 2015-03-06 (×2): qty 1

## 2015-03-06 NOTE — Progress Notes (Signed)
Subjective: She was admitted with UTI. Apparently she had urine obtained on 728 but I don't have any results from that.  Objective: Vital signs in last 24 hours: Temp:  [97.2 F (36.2 C)-98.3 F (36.8 C)] 97.8 F (36.6 C) (07/31 0645) Pulse Rate:  [59-86] 84 (07/31 0645) Resp:  [12-22] 16 (07/31 0645) BP: (103-152)/(51-83) 129/75 mmHg (07/31 0645) SpO2:  [95 %-100 %] 100 % (07/31 0645) Weight:  [77.111 kg (170 lb)-78.472 kg (173 lb)] 77.111 kg (170 lb) (07/30 1608) Weight change:  Last BM Date:  (unsure)  Intake/Output from previous day: 07/30 0701 - 07/31 0700 In: 120 [P.O.:120] Out: 913 [Urine:913]  PHYSICAL EXAM General appearance: alert and moderate distress Resp: clear to auscultation bilaterally Cardio: regular rate and rhythm, S1, S2 normal, no murmur, click, rub or gallop GI: soft, non-tender; bowel sounds normal; no masses,  no organomegaly Extremities: extremities normal, atraumatic, no cyanosis or edema  Lab Results:  Results for orders placed or performed during the hospital encounter of 03/05/15 (from the past 48 hour(s))  TSH     Status: None   Collection Time: 03/05/15 11:12 AM  Result Value Ref Range   TSH 2.412 0.350 - 4.500 uIU/mL  CBC with Differential/Platelet     Status: None   Collection Time: 03/05/15 11:13 AM  Result Value Ref Range   WBC 8.4 4.0 - 10.5 K/uL   RBC 4.22 3.87 - 5.11 MIL/uL   Hemoglobin 12.5 12.0 - 15.0 g/dL   HCT 37.7 36.0 - 46.0 %   MCV 89.3 78.0 - 100.0 fL   MCH 29.6 26.0 - 34.0 pg   MCHC 33.2 30.0 - 36.0 g/dL   RDW 14.3 11.5 - 15.5 %   Platelets 170 150 - 400 K/uL   Neutrophils Relative % 75 43 - 77 %   Neutro Abs 6.4 1.7 - 7.7 K/uL   Lymphocytes Relative 18 12 - 46 %   Lymphs Abs 1.5 0.7 - 4.0 K/uL   Monocytes Relative 6 3 - 12 %   Monocytes Absolute 0.5 0.1 - 1.0 K/uL   Eosinophils Relative 1 0 - 5 %   Eosinophils Absolute 0.0 0.0 - 0.7 K/uL   Basophils Relative 0 0 - 1 %   Basophils Absolute 0.0 0.0 - 0.1 K/uL   Comprehensive metabolic panel     Status: Abnormal   Collection Time: 03/05/15 11:13 AM  Result Value Ref Range   Sodium 136 135 - 145 mmol/L   Potassium 4.4 3.5 - 5.1 mmol/L   Chloride 101 101 - 111 mmol/L   CO2 26 22 - 32 mmol/L   Glucose, Bld 168 (H) 65 - 99 mg/dL   BUN 15 6 - 20 mg/dL   Creatinine, Ser 0.97 0.44 - 1.00 mg/dL   Calcium 9.1 8.9 - 10.3 mg/dL   Total Protein 6.7 6.5 - 8.1 g/dL   Albumin 3.5 3.5 - 5.0 g/dL   AST 34 15 - 41 U/L   ALT 32 14 - 54 U/L   Alkaline Phosphatase 68 38 - 126 U/L   Total Bilirubin 0.7 0.3 - 1.2 mg/dL   GFR calc non Af Amer 53 (L) >60 mL/min   GFR calc Af Amer >60 >60 mL/min    Comment: (NOTE) The eGFR has been calculated using the CKD EPI equation. This calculation has not been validated in all clinical situations. eGFR's persistently <60 mL/min signify possible Chronic Kidney Disease.    Anion gap 9 5 - 15  Troponin I  Status: Abnormal   Collection Time: 03/05/15 11:13 AM  Result Value Ref Range   Troponin I 0.06 (H) <0.031 ng/mL    Comment:        PERSISTENTLY INCREASED TROPONIN VALUES IN THE RANGE OF 0.04-0.49 ng/mL CAN BE SEEN IN:       -UNSTABLE ANGINA       -CONGESTIVE HEART FAILURE       -MYOCARDITIS       -CHEST TRAUMA       -ARRYHTHMIAS       -LATE PRESENTING MYOCARDIAL INFARCTION       -COPD   CLINICAL FOLLOW-UP RECOMMENDED.   Urinalysis, Routine w reflex microscopic (not at [facility])     Status: Abnormal   Collection Time: 03/05/15 11:19 AM  Result Value Ref Range   Color, Urine YELLOW YELLOW   APPearance CLOUDY (A) CLEAR   Specific Gravity, Urine 1.025 1.005 - 1.030   pH 6.0 5.0 - 8.0   Glucose, UA NEGATIVE NEGATIVE mg/dL   Hgb urine dipstick NEGATIVE NEGATIVE   Bilirubin Urine NEGATIVE NEGATIVE   Ketones, ur TRACE (A) NEGATIVE mg/dL   Protein, ur NEGATIVE NEGATIVE mg/dL   Urobilinogen, UA 0.2 0.0 - 1.0 mg/dL   Nitrite NEGATIVE NEGATIVE   Leukocytes, UA TRACE (A) NEGATIVE  Urine microscopic-add on      Status: Abnormal   Collection Time: 03/05/15 11:19 AM  Result Value Ref Range   Squamous Epithelial / LPF FEW (A) RARE   WBC, UA 7-10 <3 WBC/hpf   RBC / HPF 3-6 <3 RBC/hpf   Bacteria, UA MANY (A) RARE  Troponin I     Status: Abnormal   Collection Time: 03/05/15  5:23 PM  Result Value Ref Range   Troponin I 0.04 (H) <0.031 ng/mL    Comment:        PERSISTENTLY INCREASED TROPONIN VALUES IN THE RANGE OF 0.04-0.49 ng/mL CAN BE SEEN IN:       -UNSTABLE ANGINA       -CONGESTIVE HEART FAILURE       -MYOCARDITIS       -CHEST TRAUMA       -ARRYHTHMIAS       -LATE PRESENTING MYOCARDIAL INFARCTION       -COPD   CLINICAL FOLLOW-UP RECOMMENDED.   Glucose, capillary     Status: Abnormal   Collection Time: 03/05/15  9:29 PM  Result Value Ref Range   Glucose-Capillary 131 (H) 65 - 99 mg/dL  Troponin I     Status: None   Collection Time: 03/05/15 10:06 PM  Result Value Ref Range   Troponin I 0.03 <0.031 ng/mL    Comment:        NO INDICATION OF MYOCARDIAL INJURY.   Troponin I     Status: None   Collection Time: 03/06/15  6:04 AM  Result Value Ref Range   Troponin I <0.03 <0.031 ng/mL    Comment:        NO INDICATION OF MYOCARDIAL INJURY.   Basic metabolic panel     Status: Abnormal   Collection Time: 03/06/15  6:04 AM  Result Value Ref Range   Sodium 138 135 - 145 mmol/L   Potassium 3.6 3.5 - 5.1 mmol/L    Comment: DELTA CHECK NOTED   Chloride 106 101 - 111 mmol/L   CO2 26 22 - 32 mmol/L   Glucose, Bld 121 (H) 65 - 99 mg/dL   BUN 15 6 - 20 mg/dL   Creatinine, Ser 0.82 0.44 -  1.00 mg/dL   Calcium 8.4 (L) 8.9 - 10.3 mg/dL   GFR calc non Af Amer >60 >60 mL/min   GFR calc Af Amer >60 >60 mL/min    Comment: (NOTE) The eGFR has been calculated using the CKD EPI equation. This calculation has not been validated in all clinical situations. eGFR's persistently <60 mL/min signify possible Chronic Kidney Disease.    Anion gap 6 5 - 15  CBC     Status: Abnormal   Collection Time:  03/06/15  6:04 AM  Result Value Ref Range   WBC 5.5 4.0 - 10.5 K/uL   RBC 3.51 (L) 3.87 - 5.11 MIL/uL   Hemoglobin 10.5 (L) 12.0 - 15.0 g/dL   HCT 31.5 (L) 36.0 - 46.0 %   MCV 89.7 78.0 - 100.0 fL   MCH 29.9 26.0 - 34.0 pg   MCHC 33.3 30.0 - 36.0 g/dL   RDW 14.3 11.5 - 15.5 %   Platelets 149 (L) 150 - 400 K/uL  Glucose, capillary     Status: Abnormal   Collection Time: 03/06/15  7:23 AM  Result Value Ref Range   Glucose-Capillary 105 (H) 65 - 99 mg/dL   Comment 1 Notify RN    Comment 2 Document in Chart     ABGS No results for input(s): PHART, PO2ART, TCO2, HCO3 in the last 72 hours.  Invalid input(s): PCO2 CULTURES No results found for this or any previous visit (from the past 240 hour(s)). Studies/Results: Dg Chest Port 1 View  03/05/2015   CLINICAL DATA:  81 year old female with a history of confusion.  EXAM: PORTABLE CHEST - 1 VIEW  COMPARISON:  01/03/2015, 10/24/2013, 12/28/2012  FINDINGS: Cardiomediastinal silhouette unchanged. Tortuosity descending thoracic aorta.  Lung volumes are low. No pneumothorax or confluent airspace disease. Chronic interstitial opacities. No displaced fracture.  IMPRESSION: Similar appearance of chronic lung changes with no evidence of acute cardiopulmonary disease.  Signed,  Dulcy Fanny. Earleen Newport, DO  [facility]  [facility]   Electronically Signed   By: Corrie Mckusick D.O.   On: 03/05/2015 11:54    Medications:  Prior to Admission:  Prescriptions prior to admission  Medication Sig Dispense Refill Last Dose  . acidophilus (RISAQUAD) CAPS capsule Take 1 capsule by mouth daily.   03/04/2015  . allopurinol (ZYLOPRIM) 100 MG tablet TAKE ONE TABLET BY MOUTH ONCE DAILY. 30 tablet 9 03/04/2015  . amitriptyline (ELAVIL) 75 MG tablet TAKE (1) TABLET BY MOUTH AT BEDTIME. 30 tablet 5 03/03/2015  . atorvastatin (LIPITOR) 20 MG tablet TAKE (1) TABLET BY MOUTH AT BEDTIME. 30 tablet 9 03/03/2015  . citalopram (CELEXA) 10 MG  tablet TAKE ONE TABLET BY MOUTH ONCE DAILY. 30 tablet 9 03/04/2015  . donepezil (ARICEPT) 10 MG tablet Take 10 mg by mouth at bedtime.   03/03/2015  . gabapentin (NEURONTIN) 100 MG capsule Take 1 capsule (100 mg total) by mouth at bedtime. 30 capsule 5 03/03/2015  . levothyroxine (SYNTHROID, LEVOTHROID) 112 MCG tablet Take 1 tablet (112 mcg total) by mouth daily. 30 tablet 3 03/04/2015  . linagliptin (TRADJENTA) 5 MG TABS tablet Take 5 mg by mouth daily.   03/04/2015  . LORazepam (ATIVAN) 1 MG tablet Take 1 mg by mouth 2 (two) times daily.   03/04/2015  . LORazepam (ATIVAN) 2 MG tablet Take 2 mg by mouth 5 (five) times daily as needed for anxiety.   Unknown  . losartan-hydrochlorothiazide (HYZAAR) 100-12.5 MG per tablet TAKE ONE TABLET BY MOUTH ONCE DAILY.  30 tablet 9 03/04/2015  . Multiple Vitamins-Minerals (PRESERVISION/LUTEIN PO) Take 1 tablet by mouth 2 (two) times daily.    03/04/2015  . NAMENDA 10 MG tablet TAKE (1) TABLET BY MOUTH TWICE DAILY. 60 tablet 9 03/04/2015  . risperiDONE (RISPERDAL) 0.5 MG tablet Take 0.5 mg by mouth 2 (two) times daily.    03/04/2015  . traZODone (DESYREL) 50 MG tablet Take 25 mg by mouth at bedtime.   03/03/2015  . acetaminophen (TYLENOL) 500 MG tablet Take 500-1,000 mg by mouth every 6 (six) hours as needed (pain(Max 5 tablets per 24hours)).    Unknown  . nystatin-triamcinolone (MYCOLOG II) cream Apply 1 application topically 2 (two) times daily as needed (rash).    Unknown  . [DISCONTINUED] cefUROXime (CEFTIN) 250 MG tablet Take 1 tablet (250 mg total) by mouth 2 (two) times daily with a meal. (Patient not taking: Reported on 01/03/2015) 10 tablet 0   . [DISCONTINUED] ciprofloxacin (CIPRO) 250 MG tablet Take 2 tablets (500 mg total) by mouth 2 (two) times daily. One po bid x 7 days 14 tablet 0   . [DISCONTINUED] Lactobacillus (ACIDOPHILUS) TABS TAKE ONE TABLET BY MOUTH ONCE DAILY. 30 each 5 01/03/2015 at 800a   Scheduled: . acidophilus  1 capsule Oral Daily  . allopurinol   100 mg Oral Daily  . aspirin EC  81 mg Oral Daily  . atorvastatin  20 mg Oral q1800  . cefTRIAXone (ROCEPHIN)  IV  1 g Intravenous Q24H  . citalopram  10 mg Oral Daily  . donepezil  10 mg Oral QHS  . enoxaparin (LOVENOX) injection  40 mg Subcutaneous Q24H  . gabapentin  100 mg Oral QHS  . insulin aspart  0-5 Units Subcutaneous QHS  . insulin aspart  0-9 Units Subcutaneous TID WC  . levothyroxine  112 mcg Oral QAC breakfast  . losartan  100 mg Oral Daily  . memantine  10 mg Oral BID  . risperiDONE  0.5 mg Oral BID  . sodium chloride  3 mL Intravenous Q12H   Continuous: . sodium chloride 100 mL/hr at 03/05/15 1733   [id number] **OR** acetaminophen, LORazepam, LORazepam, ondansetron **OR** ondansetron (ZOFRAN) IV  Assesment: She was admitted with weakness and dehydration probably a urinary tract infection.  I think she had acute urinary retention.  She has severe progressive dementia at baseline and her daughter has now agreed to DO NOT RESUSCITATE status  She has had extreme agitation and has been very difficult to manage at her assisted living facility because of her agitation thus the polypharmacy Active Problems:   Weakness   Dehydration    Plan: Continue treatments. I'm hopeful to get her back to her skilled care facility tomorrow    LOS: 1 day   Zymier Rodgers L 03/06/2015, 8:31 AM

## 2015-03-07 ENCOUNTER — Encounter (HOSPITAL_COMMUNITY): Payer: Self-pay | Admitting: Primary Care

## 2015-03-07 ENCOUNTER — Inpatient Hospital Stay (HOSPITAL_COMMUNITY): Payer: Medicare Other

## 2015-03-07 DIAGNOSIS — R079 Chest pain, unspecified: Secondary | ICD-10-CM

## 2015-03-07 DIAGNOSIS — Z515 Encounter for palliative care: Secondary | ICD-10-CM

## 2015-03-07 LAB — URINE CULTURE: Culture: >=100000

## 2015-03-07 LAB — BASIC METABOLIC PANEL
Anion gap: 5 (ref 5–15)
BUN: 10 mg/dL (ref 6–20)
CO2: 26 mmol/L (ref 22–32)
Calcium: 8.5 mg/dL — ABNORMAL LOW (ref 8.9–10.3)
Chloride: 108 mmol/L (ref 101–111)
Creatinine, Ser: 0.89 mg/dL (ref 0.44–1.00)
GFR calc Af Amer: >60 mL/min (ref >60)
GFR calc non Af Amer: 59 mL/min — ABNORMAL LOW (ref >60)
Glucose, Bld: 144 mg/dL — ABNORMAL HIGH (ref 65–99)
POTASSIUM: 3.5 mmol/L (ref 3.5–5.1)
Sodium: 139 mmol/L (ref 135–145)

## 2015-03-07 LAB — HEMOGLOBIN A1C
Hgb A1c MFr Bld: 6 % — ABNORMAL HIGH (ref 4.8–5.6)
Mean Plasma Glucose: 126 mg/dL

## 2015-03-07 LAB — GLUCOSE, CAPILLARY
GLUCOSE-CAPILLARY: 129 mg/dL — AB (ref 65–99)
GLUCOSE-CAPILLARY: 124 mg/dL — AB (ref 65–99)
Glucose-Capillary: 148 mg/dL — ABNORMAL HIGH (ref 65–99)
Glucose-Capillary: 116 mg/dL — ABNORMAL HIGH (ref 65–99)

## 2015-03-07 LAB — MAGNESIUM: Magnesium: 1.7 mg/dL (ref 1.7–2.4)

## 2015-03-07 MED ORDER — DILTIAZEM HCL ER COATED BEADS 240 MG PO CP24
240.0000 mg | ORAL_CAPSULE | Freq: Every day | ORAL | Status: DC
  Administered 2015-03-07 – 2015-03-08 (×2): 240 mg via ORAL
  Filled 2015-03-07 (×2): qty 1

## 2015-03-07 MED ORDER — DILTIAZEM HCL 25 MG/5ML IV SOLN
10.0000 mg | Freq: Once | INTRAVENOUS | Status: AC
  Administered 2015-03-07: 10 mg via INTRAVENOUS
  Filled 2015-03-07: qty 5

## 2015-03-07 MED ORDER — AMITRIPTYLINE HCL 25 MG PO TABS
75.0000 mg | ORAL_TABLET | Freq: Every day | ORAL | Status: DC
  Administered 2015-03-07: 75 mg via ORAL
  Filled 2015-03-07: qty 3

## 2015-03-07 MED ORDER — TRAZODONE HCL 50 MG PO TABS
25.0000 mg | ORAL_TABLET | Freq: Every day | ORAL | Status: DC
  Administered 2015-03-07: 25 mg via ORAL
  Filled 2015-03-07: qty 1

## 2015-03-07 NOTE — Progress Notes (Signed)
**Note De-Identified  Obfuscation** STAT EKG done; results handed off to RN

## 2015-03-07 NOTE — Progress Notes (Signed)
Brief meeting today with Mrs. [name] and her daughter, [name]).  Ms. [name] states she doesn't want Mrs. [name] "under palliative care".  Palliative care services being another layer of support and NOT HOSPICE was explained, and that people with palliative care can/do seek full treatments and support.    Ms. [name] advised that the hospital will respect her wishes about what is right for their family regardless of medical discipline involved.  Ms. [name] talks about being up for 2 days and that she will share information with her family and call if she wants consultation.  She talks about Mrs. [name] not eating well, and we briefly discuss this as part of the normal dementia decline.  Support of her wishes was expressed and encouraged to call as needed.

## 2015-03-07 NOTE — Progress Notes (Signed)
Subjective: She had some episodes of slightly high heart rate after going to the bathroom through the night last night and early this morning when into SVT with a rate of about 190. She responded to 10 mg of Cardizem IV. Her urine culture that was done as an outpatient is negative  Objective: Vital signs in last 24 hours: Temp:  [97.6 F (36.4 C)-98.8 F (37.1 C)] 98.8 F (37.1 C) (08/01 0652) Pulse Rate:  [65-83] 83 (08/01 0652) Resp:  [16] 16 (08/01 0652) BP: (130-180)/(76-112) 180/112 mmHg (08/01 0652) SpO2:  [99 %-100 %] 100 % (08/01 1540) Weight change:  Last BM Date: 03/06/15  Intake/Output from previous day: 07/31 0701 - 08/01 0700 In: 480 [P.O.:480] Out: 1650 [Urine:1650]  PHYSICAL EXAM General appearance: alert and mild distress Resp: clear to auscultation bilaterally Cardio: regular rate and rhythm, S1, S2 normal, no murmur, click, rub or gallop GI: soft, non-tender; bowel sounds normal; no masses,  no organomegaly Extremities: extremities normal, atraumatic, no cyanosis or edema  Lab Results:  Results for orders placed or performed during the hospital encounter of 03/05/15 (from the past 48 hour(s))  TSH     Status: None   Collection Time: 03/05/15 11:12 AM  Result Value Ref Range   TSH 2.412 0.350 - 4.500 uIU/mL  CBC with Differential/Platelet     Status: None   Collection Time: 03/05/15 11:13 AM  Result Value Ref Range   WBC 8.4 4.0 - 10.5 K/uL   RBC 4.22 3.87 - 5.11 MIL/uL   Hemoglobin 12.5 12.0 - 15.0 g/dL   HCT 37.7 36.0 - 46.0 %   MCV 89.3 78.0 - 100.0 fL   MCH 29.6 26.0 - 34.0 pg   MCHC 33.2 30.0 - 36.0 g/dL   RDW 14.3 11.5 - 15.5 %   Platelets 170 150 - 400 K/uL   Neutrophils Relative % 75 43 - 77 %   Neutro Abs 6.4 1.7 - 7.7 K/uL   Lymphocytes Relative 18 12 - 46 %   Lymphs Abs 1.5 0.7 - 4.0 K/uL   Monocytes Relative 6 3 - 12 %   Monocytes Absolute 0.5 0.1 - 1.0 K/uL   Eosinophils Relative 1 0 - 5 %   Eosinophils Absolute 0.0 0.0 - 0.7 K/uL   Basophils Relative 0 0 - 1 %   Basophils Absolute 0.0 0.0 - 0.1 K/uL  Comprehensive metabolic panel     Status: Abnormal   Collection Time: 03/05/15 11:13 AM  Result Value Ref Range   Sodium 136 135 - 145 mmol/L   Potassium 4.4 3.5 - 5.1 mmol/L   Chloride 101 101 - 111 mmol/L   CO2 26 22 - 32 mmol/L   Glucose, Bld 168 (H) 65 - 99 mg/dL   BUN 15 6 - 20 mg/dL   Creatinine, Ser 0.97 0.44 - 1.00 mg/dL   Calcium 9.1 8.9 - 10.3 mg/dL   Total Protein 6.7 6.5 - 8.1 g/dL   Albumin 3.5 3.5 - 5.0 g/dL   AST 34 15 - 41 U/L   ALT 32 14 - 54 U/L   Alkaline Phosphatase 68 38 - 126 U/L   Total Bilirubin 0.7 0.3 - 1.2 mg/dL   GFR calc non Af Amer 53 (L) >60 mL/min   GFR calc Af Amer >60 >60 mL/min    Comment: (NOTE) The eGFR has been calculated using the CKD EPI equation. This calculation has not been validated in all clinical situations. eGFR's persistently <60 mL/min signify possible Chronic Kidney  Disease.    Anion gap 9 5 - 15  Troponin I     Status: Abnormal   Collection Time: 03/05/15 11:13 AM  Result Value Ref Range   Troponin I 0.06 (H) <0.031 ng/mL    Comment:        PERSISTENTLY INCREASED TROPONIN VALUES IN THE RANGE OF 0.04-0.49 ng/mL CAN BE SEEN IN:       -UNSTABLE ANGINA       -CONGESTIVE HEART FAILURE       -MYOCARDITIS       -CHEST TRAUMA       -ARRYHTHMIAS       -LATE PRESENTING MYOCARDIAL INFARCTION       -COPD   CLINICAL FOLLOW-UP RECOMMENDED.   Urinalysis, Routine w reflex microscopic (not at [facility])     Status: Abnormal   Collection Time: 03/05/15 11:19 AM  Result Value Ref Range   Color, Urine YELLOW YELLOW   APPearance CLOUDY (A) CLEAR   Specific Gravity, Urine 1.025 1.005 - 1.030   pH 6.0 5.0 - 8.0   Glucose, UA NEGATIVE NEGATIVE mg/dL   Hgb urine dipstick NEGATIVE NEGATIVE   Bilirubin Urine NEGATIVE NEGATIVE   Ketones, ur TRACE (A) NEGATIVE mg/dL   Protein, ur NEGATIVE NEGATIVE mg/dL   Urobilinogen, UA 0.2 0.0 - 1.0 mg/dL   Nitrite NEGATIVE NEGATIVE    Leukocytes, UA TRACE (A) NEGATIVE  Urine culture     Status: None (Preliminary result)   Collection Time: 03/05/15 11:19 AM  Result Value Ref Range   Specimen Description URINE, CATHETERIZED    Special Requests NONE    Culture      TOO YOUNG TO READ Performed at [facility]    Report Status PENDING   Urine microscopic-add on     Status: Abnormal   Collection Time: 03/05/15 11:19 AM  Result Value Ref Range   Squamous Epithelial / LPF FEW (A) RARE   WBC, UA 7-10 <3 WBC/hpf   RBC / HPF 3-6 <3 RBC/hpf   Bacteria, UA MANY (A) RARE  Troponin I     Status: Abnormal   Collection Time: 03/05/15  5:23 PM  Result Value Ref Range   Troponin I 0.04 (H) <0.031 ng/mL    Comment:        PERSISTENTLY INCREASED TROPONIN VALUES IN THE RANGE OF 0.04-0.49 ng/mL CAN BE SEEN IN:       -UNSTABLE ANGINA       -CONGESTIVE HEART FAILURE       -MYOCARDITIS       -CHEST TRAUMA       -ARRYHTHMIAS       -LATE PRESENTING MYOCARDIAL INFARCTION       -COPD   CLINICAL FOLLOW-UP RECOMMENDED.   Glucose, capillary     Status: Abnormal   Collection Time: 03/05/15  9:29 PM  Result Value Ref Range   Glucose-Capillary 131 (H) 65 - 99 mg/dL  Troponin I     Status: None   Collection Time: 03/05/15 10:06 PM  Result Value Ref Range   Troponin I 0.03 <0.031 ng/mL    Comment:        NO INDICATION OF MYOCARDIAL INJURY.   Troponin I     Status: None   Collection Time: 03/06/15  6:04 AM  Result Value Ref Range   Troponin I <0.03 <0.031 ng/mL    Comment:        NO INDICATION OF MYOCARDIAL INJURY.   Basic metabolic panel     Status: Abnormal  Collection Time: 03/06/15  6:04 AM  Result Value Ref Range   Sodium 138 135 - 145 mmol/L   Potassium 3.6 3.5 - 5.1 mmol/L    Comment: DELTA CHECK NOTED   Chloride 106 101 - 111 mmol/L   CO2 26 22 - 32 mmol/L   Glucose, Bld 121 (H) 65 - 99 mg/dL   BUN 15 6 - 20 mg/dL   Creatinine, Ser 0.82 0.44 - 1.00 mg/dL   Calcium 8.4 (L) 8.9 - 10.3 mg/dL   GFR calc non  Af Amer >60 >60 mL/min   GFR calc Af Amer >60 >60 mL/min    Comment: (NOTE) The eGFR has been calculated using the CKD EPI equation. This calculation has not been validated in all clinical situations. eGFR's persistently <60 mL/min signify possible Chronic Kidney Disease.    Anion gap 6 5 - 15  CBC     Status: Abnormal   Collection Time: 03/06/15  6:04 AM  Result Value Ref Range   WBC 5.5 4.0 - 10.5 K/uL   RBC 3.51 (L) 3.87 - 5.11 MIL/uL   Hemoglobin 10.5 (L) 12.0 - 15.0 g/dL   HCT 31.5 (L) 36.0 - 46.0 %   MCV 89.7 78.0 - 100.0 fL   MCH 29.9 26.0 - 34.0 pg   MCHC 33.3 30.0 - 36.0 g/dL   RDW 14.3 11.5 - 15.5 %   Platelets 149 (L) 150 - 400 K/uL  Glucose, capillary     Status: Abnormal   Collection Time: 03/06/15  7:23 AM  Result Value Ref Range   Glucose-Capillary 105 (H) 65 - 99 mg/dL   Comment 1 Notify RN    Comment 2 Document in Chart   Glucose, capillary     Status: Abnormal   Collection Time: 03/06/15 12:02 PM  Result Value Ref Range   Glucose-Capillary 126 (H) 65 - 99 mg/dL   Comment 1 Notify RN    Comment 2 Document in Chart   Glucose, capillary     Status: Abnormal   Collection Time: 03/06/15  4:39 PM  Result Value Ref Range   Glucose-Capillary 133 (H) 65 - 99 mg/dL   Comment 1 Notify RN    Comment 2 Document in Chart   Glucose, capillary     Status: Abnormal   Collection Time: 03/06/15 10:11 PM  Result Value Ref Range   Glucose-Capillary 126 (H) 65 - 99 mg/dL  Glucose, capillary     Status: Abnormal   Collection Time: 03/07/15  7:27 AM  Result Value Ref Range   Glucose-Capillary 148 (H) 65 - 99 mg/dL   Comment 1 Notify RN    Comment 2 Document in Chart     ABGS No results for input(s): PHART, PO2ART, TCO2, HCO3 in the last 72 hours.  Invalid input(s): PCO2 CULTURES Recent Results (from the past 240 hour(s))  Urine culture     Status: None (Preliminary result)   Collection Time: 03/05/15 11:19 AM  Result Value Ref Range Status   Specimen Description  URINE, CATHETERIZED  Final   Special Requests NONE  Final   Culture   Final    TOO YOUNG TO READ Performed at [facility]    Report Status PENDING  Incomplete   Studies/Results: Dg Chest Port 1 View  03/05/2015   CLINICAL DATA:  81 year old female with a history of confusion.  EXAM: PORTABLE CHEST - 1 VIEW  COMPARISON:  01/03/2015, 10/24/2013, 12/28/2012  FINDINGS: Cardiomediastinal silhouette unchanged. Tortuosity descending thoracic aorta.  Lung  volumes are low. No pneumothorax or confluent airspace disease. Chronic interstitial opacities. No displaced fracture.  IMPRESSION: Similar appearance of chronic lung changes with no evidence of acute cardiopulmonary disease.  Signed,  Dulcy Fanny. Earleen Newport, DO  [facility]  [facility]   Electronically Signed   By: Corrie Mckusick D.O.   On: 03/05/2015 11:54    Medications:  Prior to Admission:  Prescriptions prior to admission  Medication Sig Dispense Refill Last Dose  . acidophilus (RISAQUAD) CAPS capsule Take 1 capsule by mouth daily.   03/04/2015  . allopurinol (ZYLOPRIM) 100 MG tablet TAKE ONE TABLET BY MOUTH ONCE DAILY. 30 tablet 9 03/04/2015  . amitriptyline (ELAVIL) 75 MG tablet TAKE (1) TABLET BY MOUTH AT BEDTIME. 30 tablet 5 03/03/2015  . atorvastatin (LIPITOR) 20 MG tablet TAKE (1) TABLET BY MOUTH AT BEDTIME. 30 tablet 9 03/03/2015  . citalopram (CELEXA) 10 MG tablet TAKE ONE TABLET BY MOUTH ONCE DAILY. 30 tablet 9 03/04/2015  . donepezil (ARICEPT) 10 MG tablet Take 10 mg by mouth at bedtime.   03/03/2015  . gabapentin (NEURONTIN) 100 MG capsule Take 1 capsule (100 mg total) by mouth at bedtime. 30 capsule 5 03/03/2015  . levothyroxine (SYNTHROID, LEVOTHROID) 112 MCG tablet Take 1 tablet (112 mcg total) by mouth daily. 30 tablet 3 03/04/2015  . linagliptin (TRADJENTA) 5 MG TABS tablet Take 5 mg by mouth daily.   03/04/2015  . LORazepam (ATIVAN) 1 MG tablet Take 1 mg by mouth 2 (two) times daily.    03/04/2015  . LORazepam (ATIVAN) 2 MG tablet Take 2 mg by mouth 5 (five) times daily as needed for anxiety.   Unknown  . losartan-hydrochlorothiazide (HYZAAR) 100-12.5 MG per tablet TAKE ONE TABLET BY MOUTH ONCE DAILY. 30 tablet 9 03/04/2015  . Multiple Vitamins-Minerals (PRESERVISION/LUTEIN PO) Take 1 tablet by mouth 2 (two) times daily.    03/04/2015  . NAMENDA 10 MG tablet TAKE (1) TABLET BY MOUTH TWICE DAILY. 60 tablet 9 03/04/2015  . risperiDONE (RISPERDAL) 0.5 MG tablet Take 0.5 mg by mouth 2 (two) times daily.    03/04/2015  . traZODone (DESYREL) 50 MG tablet Take 25 mg by mouth at bedtime.   03/03/2015  . acetaminophen (TYLENOL) 500 MG tablet Take 500-1,000 mg by mouth every 6 (six) hours as needed (pain(Max 5 tablets per 24hours)).    Unknown  . nystatin-triamcinolone (MYCOLOG II) cream Apply 1 application topically 2 (two) times daily as needed (rash).    Unknown  . [DISCONTINUED] cefUROXime (CEFTIN) 250 MG tablet Take 1 tablet (250 mg total) by mouth 2 (two) times daily with a meal. (Patient not taking: Reported on 01/03/2015) 10 tablet 0   . [DISCONTINUED] ciprofloxacin (CIPRO) 250 MG tablet Take 2 tablets (500 mg total) by mouth 2 (two) times daily. One po bid x 7 days 14 tablet 0   . [DISCONTINUED] Lactobacillus (ACIDOPHILUS) TABS TAKE ONE TABLET BY MOUTH ONCE DAILY. 30 each 5 01/03/2015 at 800a   Scheduled: . acidophilus  1 capsule Oral Daily  . allopurinol  100 mg Oral Daily  . aspirin EC  81 mg Oral Daily  . atorvastatin  20 mg Oral q1800  . cefTRIAXone (ROCEPHIN)  IV  1 g Intravenous Q24H  . citalopram  10 mg Oral Daily  . donepezil  10 mg Oral QHS  . enoxaparin (LOVENOX) injection  40 mg Subcutaneous Q24H  . gabapentin  100 mg Oral QHS  . insulin aspart  0-5 Units Subcutaneous QHS  .  insulin aspart  0-9 Units Subcutaneous TID WC  . levothyroxine  112 mcg Oral QAC breakfast  . losartan  100 mg Oral Daily  . memantine  10 mg Oral BID  . risperiDONE  0.5 mg Oral BID  . sodium  chloride  3 mL Intravenous Q12H   Continuous: . sodium chloride 100 mL/hr at 03/06/15 1550   GFQ:[id number] **OR** acetaminophen, LORazepam, ondansetron **OR** ondansetron (ZOFRAN) IV  Assesment: She was admitted with weakness and dehydration and was concerned that she might have a UTI but that does not appear to be the case. She had SVT and responded to Cardizem. She'll need some evaluation of that. I discussed palliative care consult with her daughter and she agrees to do that. I think part of the problem is that she had acute urinary retention that created her difficulty at home. Her daughter has requested that she go back on home medications Active Problems:   Weakness   Dehydration    Plan: Continue current treatments echocardiogram check magnesium and basic metabolic profile. Palliative care consult.    LOS: 2 days   Rembert Browe L 03/07/2015, 9:18 AM

## 2015-03-07 NOTE — Progress Notes (Signed)
Family at bedside. Echo being done at this time.

## 2015-03-07 NOTE — Progress Notes (Signed)
  Echocardiogram 2D Echocardiogram has been performed.  Bobbye Charleston 03/07/2015, 4:02 PM

## 2015-03-07 NOTE — Clinical Social Work Note (Signed)
Clinical Social Work Assessment  Patient Details  Name: [name] MRN: [id number] Date of Birth: [birth year]  Date of referral:  03/07/15               Reason for consult:  Facility Placement                Permission sought to share information with:    Permission granted to share information::     Name::        Agency::     Relationship::     Contact Information:     Housing/Transportation Living arrangements for the past 2 months:  [place] of Information:  Adult Children Patient Interpreter Needed:  None Criminal Activity/Legal Involvement Pertinent to Current Situation/Hospitalization:  No - Comment as needed Significant Relationships:  Adult Children, Other Family Members Lives with:  Facility Resident Do you feel safe going back to the place where you live?  Yes Need for family participation in patient care:  Yes (Comment)  Care giving concerns:  Pt is [facility] at [facility].    Social Worker assessment / plan:  CSW met with pt's daughter/HCPOA, [name] at bedside. Pt oriented to self only due to dementia. Pt has been a resident at [facility] for several months, but transferred from another family owned facility. Her granddaughter, Magda Paganini is Scientist, physiological at [employer] Family is very involved and supportive. Shirlean Mylar indicates that [employer] can provide best information as she is with pt all the time. Lolita Patella states pt is generally a limited assist with ADLs, primarily requiring cues. She indicates pt can be really "out of it", but then be very alert. Pt was combative before, but since starting Risperdal has been doing much better. They had order to check urine as the were concerned about UTI, but results were not back before weekend. Pt came to ED Saturday. Okay for return and no home health prior to admission. Shirlean Mylar also requests return to [facility].    Employment status:  Retired Nurse, adult PT Recommendations:  Not  assessed at this time Information / Referral to community resources:  Other (Comment Required) (return to [facility])  Patient/Family's Response to care:  Pt's daughter requests return to [facility] at d/c.   Patient/Family's Understanding of and Emotional Response to Diagnosis, Current Treatment, and Prognosis:  Pt's daughter appears to have understanding of admission diagnosis and medical history. She is aware of palliative care consult and had questions about what this was. CSW discussed and she is very open to conversation with palliative care today.   Emotional Assessment Appearance:  Appears stated age Attitude/Demeanor/Rapport:  Unable to Assess Affect (typically observed):  Unable to Assess Orientation:    Alcohol / Substance use:  Not Applicable Psych involvement (Current and /or in the community):  No (Comment)  Discharge Needs  Concerns to be addressed:  Discharge Planning Concerns Readmission within the last 30 days:  No Current discharge risk:  Cognitively Impaired Barriers to Discharge:  Continued Medical Work up   Salome Arnt, [place] 03/07/2015, 11:43 AM [phone]

## 2015-03-07 NOTE — Progress Notes (Signed)
Bladder scanned pt after voiding, 20 cc of urine noted with scan.  Will let Dr Luan Pulling know.  Will continue to monitor.

## 2015-03-07 NOTE — Care Management Note (Signed)
Case Management Note  Patient Details  Name: [name] MRN: [id number] Date of Birth: 1930-03-05  Expected Discharge Date:  03/06/15               Expected Discharge Plan:  Assisted Living / Rest Home  In-House Referral:  Clinical Social Work  Discharge planning Services  CM Consult  Post Acute Care Choice:  NA Choice offered to:  NA  DME Arranged:    DME Agency:     HH Arranged:    [place] Agency:     Status of Service:  In process, will continue to follow  Medicare Important Message Given:    Date Medicare IM Given:    Medicare IM give by:    Date Additional Medicare IM Given:    Additional Medicare Important Message give by:     If discussed at [place] of Stay Meetings, dates discussed:    Additional Comments: Pt is from [facility]. Pt has dementia and confused at baseline. Pt admitted with increased lethargy, ? UTI. Pt requires assistance with ADL's. Pt's daughter/POA present and has declined palliative consult. Family plans for patient to return ALF at [place] is aware and will arrange for return to facility. No CM needs anticipated, will cont to follow to DC.  Sherald Barge, RN 03/07/2015, 2:24 PM

## 2015-03-07 NOTE — Progress Notes (Signed)
Patients HR sustaining in 190's ordered stat EKG and paged Dr Luan Pulling, will follow order to give 10 mg of Cardizem IV and pass on to day shift RN to continue to monitor the patient.

## 2015-03-08 LAB — GLUCOSE, CAPILLARY: Glucose-Capillary: 159 mg/dL — ABNORMAL HIGH (ref 65–99)

## 2015-03-08 MED ORDER — POTASSIUM CHLORIDE CRYS ER 20 MEQ PO TBCR
40.0000 meq | EXTENDED_RELEASE_TABLET | Freq: Once | ORAL | Status: AC
  Administered 2015-03-08: 40 meq via ORAL
  Filled 2015-03-08: qty 2

## 2015-03-08 MED ORDER — DILTIAZEM HCL ER COATED BEADS 240 MG PO CP24: 240.0000 mg | ORAL_CAPSULE | Freq: Every day | ORAL | Status: DC

## 2015-03-08 MED ORDER — TRIMETHOPRIM 100 MG PO TABS: 100.0000 mg | ORAL_TABLET | Freq: Every day | ORAL | Status: AC

## 2015-03-08 NOTE — Discharge Summary (Signed)
Physician Discharge Summary  Patient ID: [name] MRN: [id number] DOB/AGE: Nov 16, 1929 81 y.o. Primary Care Physician:Doyl Bitting L, MD Admit date: 03/05/2015 Discharge date: 03/08/2015    Discharge Diagnoses:   Active Problems:   Weakness   Dehydration   Palliative care encounter  acute urinary retention Paroxysmal supraventricular tachycardia Dementia    Medication List    TAKE these medications        acetaminophen 500 MG tablet  Commonly known as:  TYLENOL  Take 500-1,000 mg by mouth every 6 (six) hours as needed (pain(Max 5 tablets per 24hours)).     acidophilus Caps capsule  Take 1 capsule by mouth daily.     allopurinol 100 MG tablet  Commonly known as:  ZYLOPRIM  TAKE ONE TABLET BY MOUTH ONCE DAILY.     amitriptyline 75 MG tablet  Commonly known as:  ELAVIL  TAKE (1) TABLET BY MOUTH AT BEDTIME.     atorvastatin 20 MG tablet  Commonly known as:  LIPITOR  TAKE (1) TABLET BY MOUTH AT BEDTIME.     citalopram 10 MG tablet  Commonly known as:  CELEXA  TAKE ONE TABLET BY MOUTH ONCE DAILY.     diltiazem 240 MG 24 hr capsule  Commonly known as:  CARDIZEM CD  Take 1 capsule (240 mg total) by mouth daily.     donepezil 10 MG tablet  Commonly known as:  ARICEPT  Take 10 mg by mouth at bedtime.     gabapentin 100 MG capsule  Commonly known as:  NEURONTIN  Take 1 capsule (100 mg total) by mouth at bedtime.     levothyroxine 112 MCG tablet  Commonly known as:  SYNTHROID, LEVOTHROID  Take 1 tablet (112 mcg total) by mouth daily.     linagliptin 5 MG Tabs tablet  Commonly known as:  TRADJENTA  Take 5 mg by mouth daily.     LORazepam 1 MG tablet  Commonly known as:  ATIVAN  Take 1 mg by mouth 2 (two) times daily.     LORazepam 2 MG tablet  Commonly known as:  ATIVAN  Take 2 mg by mouth 5 (five) times daily as needed for anxiety.     losartan-hydrochlorothiazide 100-12.5 MG per tablet  Commonly known as:  HYZAAR  TAKE ONE TABLET BY MOUTH ONCE DAILY.      NAMENDA 10 MG tablet  Generic drug:  memantine  TAKE (1) TABLET BY MOUTH TWICE DAILY.     nystatin-triamcinolone cream  Commonly known as:  MYCOLOG II  Apply 1 application topically 2 (two) times daily as needed (rash).     PRESERVISION/LUTEIN PO  Take 1 tablet by mouth 2 (two) times daily.     risperiDONE 0.5 MG tablet  Commonly known as:  RISPERDAL  Take 0.5 mg by mouth 2 (two) times daily.     traZODone 50 MG tablet  Commonly known as:  DESYREL  Take 25 mg by mouth at bedtime.     trimethoprim 100 MG tablet  Commonly known as:  TRIMPEX  Take 1 tablet (100 mg total) by mouth daily.        Discharged Condition: Improved    Consults: None  Significant Diagnostic Studies: Dg Chest Port 1 View  03/05/2015   CLINICAL DATA:  81 year old female with a history of confusion.  EXAM: PORTABLE CHEST - 1 VIEW  COMPARISON:  01/03/2015, 10/24/2013, 12/28/2012  FINDINGS: Cardiomediastinal silhouette unchanged. Tortuosity descending thoracic aorta.  Lung volumes are low. No pneumothorax or confluent airspace disease.  Chronic interstitial opacities. No displaced fracture.  IMPRESSION: Similar appearance of chronic lung changes with no evidence of acute cardiopulmonary disease.  Signed,  Dulcy Fanny. Earleen Newport, DO  [facility]  [facility]   Electronically Signed   By: Corrie Mckusick D.O.   On: 03/05/2015 11:54    Lab Results: Basic Metabolic Panel:  Recent Labs  03/06/15 0604 03/07/15 1039  NA 138 139  K 3.6 3.5  CL 106 108  CO2 26 26  GLUCOSE 121* 144*  BUN 15 10  CREATININE 0.82 0.89  CALCIUM 8.4* 8.5*  MG  --  1.7   Liver Function Tests:  Recent Labs  03/05/15 1113  AST 34  ALT 32  ALKPHOS 68  BILITOT 0.7  PROT 6.7  ALBUMIN 3.5     CBC:  Recent Labs  03/05/15 1113 03/06/15 0604  WBC 8.4 5.5  NEUTROABS 6.4  --   HGB 12.5 10.5*  HCT 37.7 31.5*  MCV 89.3 89.7  PLT 170 149*    Recent Results (from the past  240 hour(s))  Urine culture     Status: None   Collection Time: 03/05/15 11:19 AM  Result Value Ref Range Status   Specimen Description URINE, CATHETERIZED  Final   Special Requests NONE  Final   Culture   Final    >=100,000 COLONIES/mL AEROCOCCUS URINAE Performed at [facility]    Report Status 03/07/2015 FINAL  Final  Culture, Urine     Status: None (Preliminary result)   Collection Time: 03/05/15  9:30 PM  Result Value Ref Range Status   Specimen Description URINE, CLEAN CATCH  Final   Special Requests NONE  Final   Culture   Final    CULTURE REINCUBATED FOR BETTER GROWTH Performed at [facility]    Report Status PENDING  Incomplete     Hospital Course: This is an 81 year old resident at a assisted living facility who had increasing problems with confusion. She became more agitated. She had urinalysis and culture obtained at her assisted living facility which was negative. She was eventually brought to the emergency room and she was found to have acute urinary retention. Urine grew Aerococcus which is generally not felt to be a pathogen. She was started on Rocephin. She had episodes of supraventricular tachycardia and was treated with Cardizem with good resolution. She has echocardiogram pending. She is back essentially at baseline at time of discharge  Discharge Exam: Blood pressure 131/85, pulse 68, temperature 98.7 F (37.1 C), temperature source Oral, resp. rate 18, height 5\' 3"  (1.6 m), weight 77.111 kg (170 lb), SpO2 99 %. She is awake and alert. Her chest is clear. Heart is regular. She is confused  Disposition: Home with home health services. She will be on Cardizem CD 240 to try to keep her from having the episodes of tachycardia her echocardiogram will be reviewed and she is going to be on trimethoprim 100 mg daily to see if we can keep her from having urinary tract infections. Her family has now agreed to no CODE BLUE status which I think is appropriate       Discharge Instructions    Discharge patient    Complete by:  As directed              Signed: Beya Tipps L   03/08/2015, 9:08 AM

## 2015-03-08 NOTE — Clinical Social Work Note (Signed)
Pt d/c today back to [facility]. Pt's daughter Shirlean Mylar and facility aware and agreeable. [name] plans to transport pt.  Benay Pike, [place]

## 2015-03-08 NOTE — Care Management Note (Signed)
Case Management Note  Patient Details  Name: [name] MRN: [id number] Date of Birth: Jan 07, 1930   Expected Discharge Date:  03/06/15               Expected Discharge Plan:  Assisted Living / Rest Home  In-House Referral:  Clinical Social Work  Discharge planning Services  CM Consult  Post Acute Care Choice:  Home Health Choice offered to:  Adult Children  DME Arranged:    DME Agency:     HH Arranged:  RN, PT [place] Agency:  [place]  Status of Service:  Completed, signed off  Medicare Important Message Given:    Date Medicare IM Given:    Medicare IM give by:    Date Additional Medicare IM Given:    Additional Medicare Important Message give by:     If discussed at [place] of Stay Meetings, dates discussed:    Additional Comments: Pt being discharge to ALF today. Patient's daughter in room. MD has ordered [facility] RN and PT. Pt's daughter agreeable. Pt's ALF facility requests [facility] be used, daughter is agreeable to [facility]. Ernestina Columbia, of [facility] made aware of referral and will obtain pt info from chart. Family made aware [facility] has 48 hours to make first visit. No further CM needs at this time. CSW has spoken with facility and has arranged for return. Patient's family to transport patient back to facility.   Sherald Barge, RN 03/08/2015, 9:18 AM

## 2015-03-08 NOTE — Progress Notes (Signed)
She is overall much better. Echocardiogram report is pending. She was able to ambulate. She has not had any further episodes of rapid heart rate.She is ready for discharge

## 2015-03-08 NOTE — Progress Notes (Signed)
NURSING PROGRESS NOTE  [name] [id number] Discharge Data: 03/08/2015 11:54 AM Attending Provider: No att. providers found DHR:CBULAGT,XMIWOE L, MD   [name] to be D/C'd Nursing Home per MD order.    All IV's discontinued and monitored for bleeding.  All belongings returned to patient for patient to take home.  AVS summary and prescriptions reviewed with patient and caregiver.  Envelope for ALF given to caregiver.  Patient left floor via wheelchair, escorted by NT.  Last Documented Vital Signs:  Blood pressure 131/85, pulse 68, temperature 98.7 F (37.1 C), temperature source Oral, resp. rate 18, height 5\' 3"  (1.6 m), weight 77.111 kg (170 lb), SpO2 99 %.  Cecilie Kicks D

## 2015-03-08 NOTE — Progress Notes (Signed)
Pt requesting to walk. Nurse ambulated pt with gait belt 25 ft. Pt unsteady at times, but tolerated well. Will continue to monitor frequently throughout night. Bed remains in lowest position and call bell is within reach.

## 2015-03-09 LAB — URINE CULTURE: Culture: >=100000

## 2015-03-11 ENCOUNTER — Other Ambulatory Visit (HOSPITAL_COMMUNITY)
Admission: RE | Admit: 2015-03-11 | Discharge: 2015-03-11 | Disposition: A | Discharge status: Home / Self Care | Payer: Medicare Other | Source: Other Acute Inpatient Hospital | Attending: Pulmonary Disease | Admitting: Pulmonary Disease

## 2015-03-11 DIAGNOSIS — N390 Urinary tract infection, site not specified: Secondary | ICD-10-CM | POA: Diagnosis present

## 2015-03-11 LAB — URINALYSIS, ROUTINE W REFLEX MICROSCOPIC
Bilirubin Urine: NEGATIVE
Glucose, UA: NEGATIVE mg/dL
Hgb urine dipstick: NEGATIVE
KETONES UR: NEGATIVE mg/dL
Leukocytes, UA: NEGATIVE
Nitrite: NEGATIVE
Protein, ur: NEGATIVE mg/dL
SPECIFIC GRAVITY, URINE: 1.02 (ref 1.005–1.030)
Urobilinogen, UA: 0.2 mg/dL (ref 0.0–1.0)
pH: 6 (ref 5.0–8.0)

## 2015-03-12 ENCOUNTER — Emergency Department (HOSPITAL_COMMUNITY): Payer: Medicare Other

## 2015-03-12 ENCOUNTER — Encounter (HOSPITAL_COMMUNITY): Payer: Self-pay

## 2015-03-12 ENCOUNTER — Emergency Department (HOSPITAL_COMMUNITY)
Admission: EM | Admit: 2015-03-12 | Discharge: 2015-03-12 | Disposition: A | Discharge status: Home / Self Care | Payer: Medicare Other | Attending: Emergency Medicine | Admitting: Emergency Medicine

## 2015-03-12 DIAGNOSIS — K648 Other hemorrhoids: Secondary | ICD-10-CM | POA: Insufficient documentation

## 2015-03-12 DIAGNOSIS — I10 Essential (primary) hypertension: Secondary | ICD-10-CM | POA: Diagnosis not present

## 2015-03-12 DIAGNOSIS — I4891 Unspecified atrial fibrillation: Secondary | ICD-10-CM | POA: Insufficient documentation

## 2015-03-12 DIAGNOSIS — Z85820 Personal history of malignant melanoma of skin: Secondary | ICD-10-CM | POA: Insufficient documentation

## 2015-03-12 DIAGNOSIS — G43909 Migraine, unspecified, not intractable, without status migrainosus: Secondary | ICD-10-CM | POA: Diagnosis not present

## 2015-03-12 DIAGNOSIS — F329 Major depressive disorder, single episode, unspecified: Secondary | ICD-10-CM | POA: Diagnosis not present

## 2015-03-12 DIAGNOSIS — R519 Headache, unspecified: Secondary | ICD-10-CM

## 2015-03-12 DIAGNOSIS — E039 Hypothyroidism, unspecified: Secondary | ICD-10-CM | POA: Diagnosis not present

## 2015-03-12 DIAGNOSIS — Z792 Long term (current) use of antibiotics: Secondary | ICD-10-CM | POA: Insufficient documentation

## 2015-03-12 DIAGNOSIS — E785 Hyperlipidemia, unspecified: Secondary | ICD-10-CM | POA: Insufficient documentation

## 2015-03-12 DIAGNOSIS — M1990 Unspecified osteoarthritis, unspecified site: Secondary | ICD-10-CM | POA: Insufficient documentation

## 2015-03-12 DIAGNOSIS — Z8551 Personal history of malignant neoplasm of bladder: Secondary | ICD-10-CM | POA: Diagnosis not present

## 2015-03-12 DIAGNOSIS — F0280 Dementia in other diseases classified elsewhere without behavioral disturbance: Secondary | ICD-10-CM | POA: Diagnosis not present

## 2015-03-12 DIAGNOSIS — R531 Weakness: Secondary | ICD-10-CM

## 2015-03-12 DIAGNOSIS — G309 Alzheimer's disease, unspecified: Secondary | ICD-10-CM | POA: Insufficient documentation

## 2015-03-12 DIAGNOSIS — Z79899 Other long term (current) drug therapy: Secondary | ICD-10-CM | POA: Insufficient documentation

## 2015-03-12 DIAGNOSIS — R51 Headache: Secondary | ICD-10-CM

## 2015-03-12 DIAGNOSIS — M109 Gout, unspecified: Secondary | ICD-10-CM | POA: Diagnosis not present

## 2015-03-12 LAB — CBC WITH DIFFERENTIAL/PLATELET
Basophils Absolute: 0 10*3/uL (ref 0.0–0.1)
Basophils Relative: 0 % (ref 0–1)
Eosinophils Absolute: 0.1 10*3/uL (ref 0.0–0.7)
Eosinophils Relative: 1 % (ref 0–5)
HCT: 36.8 % (ref 36.0–46.0)
Hemoglobin: 12.4 g/dL (ref 12.0–15.0)
Lymphocytes Relative: 29 % (ref 12–46)
Lymphs Abs: 2 10*3/uL (ref 0.7–4.0)
MCH: 29.7 pg (ref 26.0–34.0)
MCHC: 33.7 g/dL (ref 30.0–36.0)
MCV: 88 fL (ref 78.0–100.0)
Monocytes Absolute: 0.5 10*3/uL (ref 0.1–1.0)
Monocytes Relative: 7 % (ref 3–12)
Neutro Abs: 4.3 10*3/uL (ref 1.7–7.7)
Neutrophils Relative %: 63 % (ref 43–77)
Platelets: 180 10*3/uL (ref 150–400)
RBC: 4.18 MIL/uL (ref 3.87–5.11)
RDW: 14.3 % (ref 11.5–15.5)
WBC: 6.9 10*3/uL (ref 4.0–10.5)

## 2015-03-12 LAB — URINALYSIS, ROUTINE W REFLEX MICROSCOPIC
BILIRUBIN URINE: NEGATIVE
Glucose, UA: NEGATIVE mg/dL
Hgb urine dipstick: NEGATIVE
Ketones, ur: NEGATIVE mg/dL
Nitrite: NEGATIVE
Protein, ur: NEGATIVE mg/dL
SPECIFIC GRAVITY, URINE: 1.015 (ref 1.005–1.030)
UROBILINOGEN UA: 0.2 mg/dL (ref 0.0–1.0)
pH: 7 (ref 5.0–8.0)

## 2015-03-12 LAB — COMPREHENSIVE METABOLIC PANEL
ALT: 34 U/L (ref 14–54)
AST: 28 U/L (ref 15–41)
Albumin: 3.7 g/dL (ref 3.5–5.0)
Alkaline Phosphatase: 71 U/L (ref 38–126)
Anion gap: 9 (ref 5–15)
BUN: 14 mg/dL (ref 6–20)
CO2: 28 mmol/L (ref 22–32)
Calcium: 9.1 mg/dL (ref 8.9–10.3)
Chloride: 96 mmol/L — ABNORMAL LOW (ref 101–111)
Creatinine, Ser: 0.92 mg/dL (ref 0.44–1.00)
GFR calc Af Amer: >60 mL/min (ref >60)
GFR calc non Af Amer: 56 mL/min — ABNORMAL LOW (ref >60)
Glucose, Bld: 152 mg/dL — ABNORMAL HIGH (ref 65–99)
Potassium: 3.7 mmol/L (ref 3.5–5.1)
Sodium: 133 mmol/L — ABNORMAL LOW (ref 135–145)
Total Bilirubin: 0.5 mg/dL (ref 0.3–1.2)
Total Protein: 6.9 g/dL (ref 6.5–8.1)

## 2015-03-12 LAB — PROTIME-INR
INR: 1 (ref 0.00–1.49)
Prothrombin Time: 13.4 seconds (ref 11.6–15.2)

## 2015-03-12 LAB — URINE MICROSCOPIC-ADD ON

## 2015-03-12 LAB — PHOSPHORUS: Phosphorus: 3.5 mg/dL (ref 2.5–4.6)

## 2015-03-12 LAB — MAGNESIUM: Magnesium: 1.8 mg/dL (ref 1.7–2.4)

## 2015-03-12 MED ORDER — SODIUM CHLORIDE 0.9 % IV SOLN: Freq: Once | INTRAVENOUS | Status: DC

## 2015-03-12 NOTE — ED Notes (Signed)
Pt admitted to room 16 with possible stroke symptoms, not a code stroke at this time. Generalized weakness x 2 days per family member in room. Pt able to verbalize answers to nurse questions. EKG, CT scan done and IV placed. EDP notified. Labs drawn and sent for analysis.

## 2015-03-12 NOTE — ED Notes (Addendum)
Spoke with Dr. Oleta Mouse and notified her of patient's symptoms. CT head ordered and patient transported to CT by primary nurse Randa Evens.

## 2015-03-12 NOTE — Discharge Instructions (Signed)
Your CT head did not show mass or brain bleed. Your presentation today is not consistent with stroke. Unclear of the cause of her headache, but has been pain free in the ED. No evidence of new infection, electrolyte problems, or metabolic problems. Please have this patient follow-up with her primary care physician on Monday for re-evaluation. Return for worsening symptoms, including altered mental status, fever, worsening pain, vomiting and unable to keep down food/fluids, or any other symptoms concerning to you.    General Headache Without Cause A headache is pain or discomfort felt around the head or neck area. The specific cause of a headache may not be found. There are many causes and types of headaches. A few common ones are:  Tension headaches.  Migraine headaches.  Cluster headaches.  Chronic daily headaches. HOME CARE INSTRUCTIONS   Keep all follow-up appointments with your caregiver or any specialist referral.  Only take over-the-counter or prescription medicines for pain or discomfort as directed by your caregiver.  Lie down in a dark, quiet room when you have a headache.  Keep a headache journal to find out what may trigger your migraine headaches. For example, write down:  What you eat and drink.  How much sleep you get.  Any change to your diet or medicines.  Try massage or other relaxation techniques.  Put ice packs or heat on the head and neck. Use these 3 to 4 times per day for 15 to 20 minutes each time, or as needed.  Limit stress.  Sit up straight, and do not tense your muscles.  Quit smoking if you smoke.  Limit alcohol use.  Decrease the amount of caffeine you drink, or stop drinking caffeine.  Eat and sleep on a regular schedule.  Get 7 to 9 hours of sleep, or as recommended by your caregiver.  Keep lights dim if bright lights bother you and make your headaches worse. SEEK MEDICAL CARE IF:   You have problems with the medicines you were  prescribed.  Your medicines are not working.  You have a change from the usual headache.  You have nausea or vomiting. SEEK IMMEDIATE MEDICAL CARE IF:   Your headache becomes severe.  You have a fever.  You have a stiff neck.  You have loss of vision.  You have muscular weakness or loss of muscle control.  You start losing your balance or have trouble walking.  You feel faint or pass out.  You have severe symptoms that are different from your first symptoms. MAKE SURE YOU:   Understand these instructions.  Will watch your condition.  Will get help right away if you are not doing well or get worse. Document Released: 07/23/2005 Document Revised: 10/15/2011 Document Reviewed: 08/08/2011 [facility] Patient Information 2015 [place], [place]. This information is not intended to replace advice given to you by your health care provider. Make sure you discuss any questions you have with your health care provider.

## 2015-03-12 NOTE — ED Notes (Signed)
Discharge instructions given, pt demonstrated teach back and verbal understanding. No concerns voiced.  

## 2015-03-12 NOTE — Discharge Summary (Deleted)
[name], [name]                 ACCOUNT NO.:  [id number]  MEDICAL RECORD NO.:  [id number]  LOCATION:  LAB                           FACILITY:  [facility]  PHYSICIAN:  Emmeline Winebarger L. Luan Pulling, M.D.DATE OF BIRTH:  1929-08-19  DATE OF ADMISSION:  03/11/2015 DATE OF DISCHARGE:  08/05/2016LH                              DISCHARGE SUMMARY   ADDENDUM:  DISCHARGE DIAGNOSIS:  Lethargy related to dehydration.     Rochanda Harpham L. Luan Pulling, M.D.     ELH/MEDQ  D:  03/12/2015  T:  03/12/2015  Job:  [id number]

## 2015-03-12 NOTE — Progress Notes (Signed)
1910-call received from ER regarding [place] coming to CT 1916-patient came with the nurse to CT 1917-patient scanned and sent to [facility] 1920-spoke to Dr Radene Knee

## 2015-03-12 NOTE — ED Provider Notes (Addendum)
CSN: [id number]     Arrival date & time 03/12/15  1848 History   First MD Initiated Contact with Patient 03/12/15 1912     Chief Complaint  Patient presents with  . Headache     (Consider location/radiation/quality/duration/timing/severity/associated sxs/prior Treatment) HPI History provided by patient's daughter given patient's history of dementia.  81 year old female with history of atrial fibrillation, alzheimer's disease (AOx1 at baseline), HTN who presents with headache. She is not on anticoagulation. She presents from nursing facility. Daughter reports that yesterday she was called but nursing facility that her mother complained of headache. On arrival, she was noted to have had her face leaning on her hand and stooped forward. They noticed that she was drooling from the left side of her face which she normally does not do, but daughter reports that she did not appreciate any facial droop. She was also noted to be more confused than usual and her head was leaning more towards the right. She was taken to bed. Noted by staffed to have decreased energy again today. Complained of headache this evening again, and then sent to the ED.   Recently discharged from hospital for dehydration. Started on cardizem and bactrim for presumed UTI.   On arrival, patient reports that she does not headache or pain. Daugther denies slurring of speech or facial droop. Reports that her mother's mental status is her baseline. Patient denies chest pain, abdominal pain, nausea, vomiting, difficulty breathing. No fevers reported from nursing facility.  Past Medical History  Diagnosis Date  . Thyroid disease   . Hypertension   . Gout   . Dementia   . Vertigo   . Low back pain   . Hypothyroidism   . Arthritis   . Hyperlipidemia   . Hemorrhoids with complication   . Cancer 2012    Melanoma-Face Removed at [facility]  . Bladder cancer     saw dr. Rosana Hoes had surgery had follow about 3 years ago  . Atrial  fibrillation   . Migraine   . Depression   . Hyperglycemia   . Alzheimer disease   . Atrial fibrillation    Past Surgical History  Procedure Laterality Date  . Cholecystectomy    . Colonoscopy  11/21/09    Simple adenoma/pancolonic diverticulosis(next on due in 5/10 years)  . Eye surgery      Bilateral Cataract Surgery  . Flexible sigmoidoscopy  12/03/2011    Procedure: FLEXIBLE SIGMOIDOSCOPY;  Surgeon: Danie Binder, MD;  Location: [facility] ENDO SUITE;  Service: Endoscopy;  Laterality: N/A;  12:00  . Bladder surgery      remove cancer  . Melanoma excision      on face  . Dementia     Family History  Problem Relation Age of Onset  . Migraines Mother   . Arthritis Father   . Diabetes Father   . Cancer Brother   . Diabetes Brother   . Breast cancer Daughter   . Diabetes Brother    History  Substance Use Topics  . Smoking status: Never Smoker   . Smokeless tobacco: Never Used  . Alcohol Use: No   OB History    No data available     Review of Systems  Unable to perform ROS: Dementia     Allergies  Sulfonamide derivatives  Home Medications   Prior to Admission medications   Medication Sig Start Date End Date Taking? Authorizing Provider  acetaminophen (TYLENOL) 500 MG tablet Take 500-1,000 mg by mouth  every 6 (six) hours as needed (pain(Max 5 tablets per 24hours)).     Historical Provider, MD  acidophilus (RISAQUAD) CAPS capsule Take 1 capsule by mouth daily.    Historical Provider, MD  allopurinol (ZYLOPRIM) 100 MG tablet TAKE ONE TABLET BY MOUTH ONCE DAILY. 03/24/13   Kathyrn Drown, MD  amitriptyline (ELAVIL) 75 MG tablet TAKE (1) TABLET BY MOUTH AT BEDTIME. 06/22/13   Kathyrn Drown, MD  atorvastatin (LIPITOR) 20 MG tablet TAKE (1) TABLET BY MOUTH AT BEDTIME. 03/24/13   Kathyrn Drown, MD  citalopram (CELEXA) 10 MG tablet TAKE ONE TABLET BY MOUTH ONCE DAILY. 03/24/13   Kathyrn Drown, MD  diltiazem (CARDIZEM CD) 240 MG 24 hr capsule Take 1 capsule (240 mg total) by  mouth daily. 03/08/15   Sinda Du, MD  donepezil (ARICEPT) 10 MG tablet Take 10 mg by mouth at bedtime.    Historical Provider, MD  gabapentin (NEURONTIN) 100 MG capsule Take 1 capsule (100 mg total) by mouth at bedtime. 09/20/14   Sinda Du, MD  levothyroxine (SYNTHROID, LEVOTHROID) 112 MCG tablet Take 1 tablet (112 mcg total) by mouth daily. 11/03/13   Kathyrn Drown, MD  linagliptin (TRADJENTA) 5 MG TABS tablet Take 5 mg by mouth daily.    Historical Provider, MD  LORazepam (ATIVAN) 1 MG tablet Take 1 mg by mouth 2 (two) times daily.    Historical Provider, MD  LORazepam (ATIVAN) 2 MG tablet Take 2 mg by mouth 5 (five) times daily as needed for anxiety.    Historical Provider, MD  losartan-hydrochlorothiazide (HYZAAR) 100-12.5 MG per tablet TAKE ONE TABLET BY MOUTH ONCE DAILY. 03/24/13   Kathyrn Drown, MD  Multiple Vitamins-Minerals (PRESERVISION/LUTEIN PO) Take 1 tablet by mouth 2 (two) times daily.     Historical Provider, MD  NAMENDA 10 MG tablet TAKE (1) TABLET BY MOUTH TWICE DAILY. 03/24/13   Kathyrn Drown, MD  nystatin-triamcinolone (MYCOLOG II) cream Apply 1 application topically 2 (two) times daily as needed (rash).  12/11/12   Historical Provider, MD  risperiDONE (RISPERDAL) 0.5 MG tablet Take 0.5 mg by mouth 2 (two) times daily.     Historical Provider, MD  traZODone (DESYREL) 50 MG tablet Take 25 mg by mouth at bedtime.    Historical Provider, MD  trimethoprim (TRIMPEX) 100 MG tablet Take 1 tablet (100 mg total) by mouth daily. 03/08/15   Sinda Du, MD   BP 131/69 mmHg  Pulse 65  Temp(Src) 97.9 F (36.6 C) (Oral)  Resp 14  Ht 5\' 4"  (1.626 m)  Wt 170 lb (77.111 kg)  BMI 29.17 kg/m2  SpO2 99% Physical Exam  Nursing note and vitals reviewed. Physical Exam  Constitutional: Elderly woman, well developed, well nourished, non-toxic, and in no acute distress Head: Normocephalic and atraumatic.  Mouth/Throat: Oropharynx is clear and moist.  Neck: Normal range of motion. Neck  supple.  Cardiovascular: Normal rate and regular rhythm.  No edema. Pulmonary/Chest: Effort normal and breath sounds normal.  Abdominal: Soft. There is no tenderness. There is no rebound and no guarding.  Musculoskeletal: Normal range of motion.  Neurological:  Alert, oriented x 1 (name only). Fluent speech. No dysarthria or aphasia.  Cranial nerves: VF are full. Pupils are symmetric, and reactive to light. EOMI without nystagmus. No gaze deviation. Facial muscles symmetric with activation. Sensation to light touch over face in tact bilaterally. Hearing grossly in tact. Palate elevates symmetrically. Head turn and shoulder shrug are intact. Tongue midline.  Reflexes defered.  Muscle bulk and tone normal. Moves all extremities symmetrically. Full strength in bilateral hand grip. Able to hold bilateral upper and lower extremities symmetrically against gravity. Sensation to light touch is in tact throughout in bilateral upper and lower extremities. Gait: deferred Skin: Skin is warm and dry.  Psychiatric: Cooperative   ED Course  Procedures (including critical care time) Labs Review Labs Reviewed  COMPREHENSIVE METABOLIC PANEL - Abnormal; Notable for the following:    Sodium 133 (*)    Chloride 96 (*)    Glucose, Bld 152 (*)    GFR calc non Af Amer 56 (*)    All other components within normal limits  URINALYSIS, ROUTINE W REFLEX MICROSCOPIC (NOT AT [facility]) - Abnormal; Notable for the following:    Leukocytes, UA TRACE (*)    All other components within normal limits  CBC WITH DIFFERENTIAL/PLATELET  PROTIME-INR  MAGNESIUM  PHOSPHORUS  URINE MICROSCOPIC-ADD ON    Imaging Review Dg Chest 2 View  03/12/2015   CLINICAL DATA:  Altered mental status, drooling from LEFT side of mouth, history hypertension, bladder cancer, gout, dementia, atrial fibrillation, recent hospitalization  EXAM: CHEST  2 VIEW  COMPARISON:  03/05/2015  FINDINGS: Normal heart size, mediastinal contours and pulmonary  vascularity.  Bibasilar atelectasis.  Lungs otherwise clear.  No pleural effusion or pneumothorax.  Bones demineralized.  IMPRESSION: Bibasilar atelectasis.   Electronically Signed   By: Lavonia Neilan Rizzo M.D.   On: 03/12/2015 21:18   Ct Head Wo Contrast  03/12/2015   CLINICAL DATA:  Code stroke. Acute onset of left-sided weakness. Initial encounter.  EXAM: CT HEAD WITHOUT CONTRAST  TECHNIQUE: Contiguous axial images were obtained from the base of the skull through the vertex without intravenous contrast.  COMPARISON:  CT of the head performed 12/07/2014  FINDINGS: There is no evidence of acute infarction, mass lesion, or intra- or extra-axial hemorrhage on CT.  Prominence of the ventricles and sulci reflects moderate cortical volume loss. Mild cerebellar atrophy is noted. Scattered periventricular and subcortical white matter change likely reflects small vessel ischemic microangiopathy. Chronic ischemic change is noted at the basal ganglia bilaterally.  The brainstem and fourth ventricle are within normal limits. The cerebral hemispheres demonstrate grossly normal gray-white differentiation. No mass effect or midline shift is seen.  There is no evidence of fracture; visualized osseous structures are unremarkable in appearance. The visualized portions of the orbits are within normal limits. The paranasal sinuses and mastoid air cells are well-aerated. No significant soft tissue abnormalities are seen.  IMPRESSION: 1. No acute intracranial pathology seen on CT. 2. Moderate cortical volume loss and scattered small vessel ischemic microangiopathy. 3. Chronic ischemic change at the basal ganglia bilaterally.   Electronically Signed   By: Garald Balding M.D.   On: 03/12/2015 19:45     EKG Interpretation   Date/Time:  Saturday March 12 2015 19:11:50 EDT Ventricular Rate:  65 PR Interval:  98 QRS Duration: 98 QT Interval:  411 QTC Calculation: 427 R Axis:   70 Text Interpretation:  Sinus rhythm Short PR interval  Baseline wander in  lead(s) I III aVL V6 No ST or t-wave changes. No significant change from  prior EKG.  Confirmed by Telitha Plath MD, Hinton Dyer [phone]) on 03/12/2015 9:50:39 PM      MDM   Final diagnoses:  Acute nonintractable headache, unspecified headache type    In short, this is 81 year old female with history of Alzheimer's dementia and atrial fibrillation who presents with headache, drooling, and confusion. On  arrival, she is not toxic and in no acute distress. VS are not concerning and she is in normal sinus rhythm. Daughter who is presents with patient reports that she is at her baseline mental status. She has no complaints of headache, and grossly her neurological exam is in tact. CT head visualized and reveals no acute intracranial processes. Suspicion for stroke/TIA is low, as no clear neurological deficits were described on history and current exam not concerning. Unlikely SAH as currently no symptoms. Basic blood work reveals no major electrolyte or metabolic derangements. CXR reveals no infiltrate or other cardiopulmonary processes. UA not suggestive of infection, and currently taken bactrim from last admission for presumed UTI (although culture negative from 03/05/15). Patient observed in ED without issues. Felt appropriate for discharge back to facility. Strict return and follow-up instructions reviewed with patient's daughter. She expressed understanding of all discharge instructions and felt comfortable with the plan of care.     Forde Dandy, MD 03/13/15 [place] Cherye Gaertner, MD 03/13/15 1200

## 2015-03-12 NOTE — Discharge Summary (Signed)
NAMEFLORDIA, KASSEM                 ACCOUNT NO.:  [id number]  MEDICAL RECORD NO.:  [id number]  LOCATION:  K820                          FACILITY:  [facility]  PHYSICIAN:  Lanai Conlee L. Luan Pulling, M.D.DATE OF BIRTH:  1929/09/16  DATE OF ADMISSION:  03/05/2015 DATE OF DISCHARGE:  08/02/2016LH                              DISCHARGE SUMMARY   ADDENDUM:  DISCHARGE DIAGNOSIS:  Lethargy related to dehydration.     Keaundre Thelin L. Luan Pulling, M.D.     ELH/MEDQ  D:  03/12/2015  T:  03/12/2015  Job:  [id number]

## 2015-03-12 NOTE — ED Notes (Signed)
Headache for the past two days, holding the back of her neck and head.  Home care nurse said that she was holding her head all day yesterday. Not wanting to feed self, but will drink and eat if fed. Having weakness, not feeling right. Told me yesterday that she needed to come to the hospital. She was having drool come out the left side of her mouth yesterday and today. But did not notice any signs of a stroke.

## 2015-03-13 ENCOUNTER — Encounter (HOSPITAL_COMMUNITY): Payer: Self-pay | Admitting: Emergency Medicine

## 2015-04-29 ENCOUNTER — Other Ambulatory Visit: Payer: Self-pay | Admitting: Family Medicine

## 2015-04-29 NOTE — Telephone Encounter (Signed)
It is my understanding that this is no longer a patient of ours I have not seen her in over a year and half

## 2015-05-03 ENCOUNTER — Ambulatory Visit (INDEPENDENT_AMBULATORY_CARE_PROVIDER_SITE_OTHER): Payer: Medicare Other | Admitting: Neurology

## 2015-05-03 ENCOUNTER — Encounter: Payer: Self-pay | Admitting: Neurology

## 2015-05-03 VITALS — BP 150/72 | HR 71 | Ht 64.0 in | Wt 170.4 lb

## 2015-05-03 DIAGNOSIS — F0391 Unspecified dementia with behavioral disturbance: Secondary | ICD-10-CM | POA: Diagnosis not present

## 2015-05-03 DIAGNOSIS — E538 Deficiency of other specified B group vitamins: Secondary | ICD-10-CM

## 2015-05-03 DIAGNOSIS — R410 Disorientation, unspecified: Secondary | ICD-10-CM

## 2015-05-03 DIAGNOSIS — F05 Delirium due to known physiological condition: Secondary | ICD-10-CM

## 2015-05-03 DIAGNOSIS — F03918 Unspecified dementia, unspecified severity, with other behavioral disturbance: Secondary | ICD-10-CM

## 2015-05-03 NOTE — Patient Instructions (Addendum)
As far as your medications are concerned, I would like to suggest: Continue Aricept and Donepezil. Stop the morning Lorazepam dose. Need to follow with dr. Luan Pulling for further management of medications and other causes for confusion such as UTI.  As far as diagnostic testing: CT of the head, labs, EEG  I would like to see you back in 6 months, sooner if we need to. Please call us with any interim questions, concerns, problems, updates or refill requests.   Our phone number is [phone]. We also have an after hours call service for urgent matters and there is a physician on-call for urgent questions. For any emergencies you know to call 911 or go to the nearest emergency room

## 2015-05-03 NOTE — Progress Notes (Signed)
[facility]    Eryc Bodey:  Dr Jaynee Eagles Referring Kurtis Anastasia: Sinda Du, MD Primary Care Physician:  Alonza Bogus, MD  CC:  Dementia  HPI:  [name] is a 81 y.o. female here as a referral from Dr. Luan Pulling for severe Dementia, stated as severe in documentation from pcp. PMHx of dementia, HTN, HLD, depression, DM, UTIShe is here with her daughter and granddaughter. Diagnosed over 4 years ago by Dr. Malachy Moan. Started with her forgetting to take her medicatuon, forgetting to feed the dog and herself, leaving the phone off the hook. She couldn't live by herself anymore. Did well for 6 months. Then symptoms progressed slowly.  She started wandering at night. Daughter and Mother provide all information. She pulls to the left side, she drools, she was having very bad headaches in August. The last headache however was in August and none since then. She was started on diltiazem for afib and a preventative for a UTI. She is having swelling in her feet. She is much more confused since then. She is incontinent with her bowels. She was taken off amitriptyline and gabapentin to see if this would help. She takes ativan twice a day for behavioral disturbances. Then sometimes she will just cry. She is sundowning. Within the last month she has been more confused. Risperidal really helps. She has been on the risperidone for at least 3 months or longer. Just recently in the last month with worsening confusion. She doesn't even know her grandaughter anymore. She is more sedated, drooling, not as responsive, won't feed herself.  No reported hallucinations.  Reviewed notes, labs and imaging from outside physicians, which showed:  Personally reviewed CT of the head and agree with the following:  There is no evidence of acute infarction, mass lesion, or intra- or extra-axial hemorrhage on CT.  Prominence of the ventricles and sulci reflects moderate cortical volume loss. Mild cerebellar atrophy is  noted. Scattered periventricular and subcortical white matter change likely reflects small vessel ischemic microangiopathy. Chronic ischemic change is noted at the basal ganglia bilaterally.  The brainstem and fourth ventricle are within normal limits. The cerebral hemispheres demonstrate grossly normal gray-white differentiation. No mass effect or midline shift is seen.  There is no evidence of fracture; visualized osseous structures are unremarkable in appearance. The visualized portions of the orbits are within normal limits. The paranasal sinuses and mastoid air cells are well-aerated. No significant soft tissue abnormalities are seen.  IMPRESSION: 1. No acute intracranial pathology seen on CT. 2. Moderate cortical volume loss and scattered small vessel ischemic microangiopathy. 3. Chronic ischemic change at the basal ganglia bilaterally.     Review of Systems: Patient complains of symptoms per HPI as well as the following symptoms, swelling, incontinence,memory loss, confusion, headache, weakness, slurrred speech . Pertinent negatives per HPI. All others negative.   Social History   Social History  . Marital Status: Widowed    Spouse Name: N/A  . Number of Children: 4  . Years of Education: 12   Occupational History  . Not on file.   Social History Main Topics  . Smoking status: Never Smoker   . Smokeless tobacco: Never Used  . Alcohol Use: No  . Drug Use: No  . Sexual Activity: Not Currently   Other Topics Concern  . Not on file   Social History Narrative   Lives at [facility] [phone]    Family History  Problem Relation Age of Onset  . Migraines Mother   .  Arthritis Father   . Diabetes Father   . Cancer Brother   . Diabetes Brother   . Breast cancer Daughter   . Diabetes Brother   . Dementia Neg Hx     Past Medical History  Diagnosis Date  . Thyroid disease   . Hypertension   . Gout   . Dementia   . Vertigo   . Low back  pain   . Hypothyroidism   . Arthritis   . Hyperlipidemia   . Hemorrhoids with complication   . Cancer 2012    Melanoma-Face Removed at [facility]  . Bladder cancer     saw dr. Rosana Hoes had surgery had follow about 3 years ago  . Atrial fibrillation   . Migraine   . Depression   . Hyperglycemia   . Alzheimer disease   . Atrial fibrillation     Past Surgical History  Procedure Laterality Date  . Cholecystectomy    . Colonoscopy  11/21/09    Simple adenoma/pancolonic diverticulosis(next on due in 5/10 years)  . Eye surgery      Bilateral Cataract Surgery  . Flexible sigmoidoscopy  12/03/2011    Procedure: FLEXIBLE SIGMOIDOSCOPY;  Surgeon: Danie Binder, MD;  Location: [facility];  Service: Endoscopy;  Laterality: N/A;  12:00  . Bladder surgery      remove cancer  . Melanoma excision      on face  . Dementia      Current Outpatient Prescriptions  Medication Sig Dispense Refill  . acetaminophen (TYLENOL) 500 MG tablet Take 500-1,000 mg by mouth every 6 (six) hours as needed (pain(Max 5 tablets per 24hours)).     Marland Kitchen acidophilus (RISAQUAD) CAPS capsule Take 1 capsule by mouth daily.    Marland Kitchen allopurinol (ZYLOPRIM) 100 MG tablet TAKE ONE TABLET BY MOUTH ONCE DAILY. 30 tablet 9  . amitriptyline (ELAVIL) 75 MG tablet TAKE (1) TABLET BY MOUTH AT BEDTIME. 30 tablet 5  . atorvastatin (LIPITOR) 20 MG tablet TAKE (1) TABLET BY MOUTH AT BEDTIME. 30 tablet 9  . citalopram (CELEXA) 10 MG tablet TAKE ONE TABLET BY MOUTH ONCE DAILY. 30 tablet 9  . diltiazem (CARDIZEM CD) 180 MG 24 hr capsule Take 180 mg by mouth daily.    Marland Kitchen donepezil (ARICEPT) 10 MG tablet Take 10 mg by mouth at bedtime.    Marland Kitchen glucose blood (ACCU-CHEK AVIVA) test strip 1 each by Other route as needed for other. Use as instructed    . Lancets (ACCU-CHEK SOFT TOUCH) lancets 1 each by Other route as needed for other. Use as instructed    . levothyroxine (SYNTHROID, LEVOTHROID) 112 MCG tablet Take 1 tablet (112 mcg total) by  mouth daily. 30 tablet 3  . linagliptin (TRADJENTA) 5 MG TABS tablet Take 5 mg by mouth daily.    Marland Kitchen LORazepam (ATIVAN) 2 MG tablet Take 2 mg by mouth 5 (five) times daily as needed for anxiety.    Marland Kitchen losartan-hydrochlorothiazide (HYZAAR) 100-12.5 MG per tablet TAKE ONE TABLET BY MOUTH ONCE DAILY. 30 tablet 9  . Multiple Vitamins-Minerals (PRESERVISION/LUTEIN PO) Take 1 capsule by mouth 2 (two) times daily with a meal.    . NAMENDA 10 MG tablet TAKE (1) TABLET BY MOUTH TWICE DAILY. 60 tablet 9  . nystatin-triamcinolone (MYCOLOG II) cream Apply 1 application topically 2 (two) times daily as needed (rash).     . risperiDONE (RISPERDAL) 0.5 MG tablet Take 0.5 mg by mouth 2 (two) times daily.     . traZODone (  DESYREL) 50 MG tablet Take 25 mg by mouth at bedtime.    Marland Kitchen trimethoprim (TRIMPEX) 100 MG tablet Take 1 tablet (100 mg total) by mouth daily. 30 tablet 12   No current facility-administered medications for this visit.    Allergies as of 05/03/2015 - Review Complete 05/03/2015  Allergen Reaction Noted  . Sulfonamide derivatives Anaphylaxis, Swelling, and Rash     Vitals: BP 150/72 mmHg  Pulse 71  Ht 5\' 4"  (1.626 m)  Wt 170 lb 6.4 oz (77.293 kg)  BMI 29.23 kg/m2  SpO2 95% Last Weight:  Wt Readings from Last 1 Encounters:  05/03/15 170 lb 6.4 oz (77.293 kg)   Last Height:   Ht Readings from Last 1 Encounters:  05/03/15 5\' 4"  (1.626 m)    Left NL flattening Physical exam: Exam: Gen: NAD, not conversant, somnolent             CV: RRR, no MRG. No Carotid Bruits. +peripheral edema, warm, nontender Eyes: Conjunctivae clear without exudates or hemorrhage  Neuro: Detailed Neurologic Exam  Speech:   No aphasia or dysarthria  Cognition: Attempted MoCA could not perform    The patient is oriented to self only    recent and remote memory impaired;     language fluent;     Impaired attention, concentration,     fund of knowledge impaired Cranial Nerves:    The pupils are equal,  round, and reactive to light. Attempted fundoscopic exam could not visualize. Visual fields are full tothreat. Extraocular movements are intact. Trigeminal sensation is intact and the muscles of mastication are normal. The face is symmetric. The palate elevates in the midline. Hearing intact. Voice is normal. Shoulder shrug is normal. The tongue has normal motion without fasciculations.   Coordination:    No dysmetria.   Gait:    shuffling  Motor Observation: No tremor    No asymmetry, no atrophy, and no involuntary movements noted. Tone:    Mild cogwheeling     Posture:    stooped    Strength:    antigravity     Sensation: intact to LT     Reflex Exam:  DTR's:    Paratonia   Toes:    The toes are downgoing bilaterally.   Clonus:    Clonus is absent.      Assessment/Plan:  Very nice 81 year old female with significant dementia, progressively worsening moreso in the last few months. Appears to be of the Alzheimer's type however she has some parkinsonian symptoms so also question lewy-body dementia but no report of hallucinations. They have stopped some medications that may make her sedated.  Recommend stopping daytime dose of lorazepam as well but watch for withdrawal symptoms. Will order a CT of the head and labs today. Needs to follow closely with pcp to rule out metabolic/toxic causes of her recent cognitive decline such as UTI.  Can order EEG to evaluate for epileptiform/seizure activity. If metabolic/toxic causes ruled out, this is likely a progression of her dementia. Had a long talk with family members, dementia as they know is a neurodegenerative disease and can rapidly progress in a short period of time.   Call grandaughter leslie at [phone] with results  Sarina Ill, MD  [facility] [address] [place], [place] [address]  Phone [phone] Fax [phone]

## 2015-05-04 ENCOUNTER — Telehealth: Payer: Self-pay

## 2015-05-04 LAB — CBC
Hematocrit: 37.6 % (ref 34.0–46.6)
Hemoglobin: 12.5 g/dL (ref 11.1–15.9)
MCH: 30.3 pg (ref 26.6–33.0)
MCHC: 33.2 g/dL (ref 31.5–35.7)
MCV: 91 fL (ref 79–97)
PLATELETS: 202 10*3/uL (ref 150–379)
RBC: 4.13 x10E6/uL (ref 3.77–5.28)
RDW: 13.5 % (ref 12.3–15.4)
WBC: 8.8 10*3/uL (ref 3.4–10.8)

## 2015-05-04 LAB — COMPREHENSIVE METABOLIC PANEL
A/G RATIO: 1.4 (ref 1.1–2.5)
ALT: 18 IU/L (ref 0–32)
AST: 20 IU/L (ref 0–40)
Albumin: 3.8 g/dL (ref 3.5–4.7)
Alkaline Phosphatase: 80 IU/L (ref 39–117)
BUN/Creatinine Ratio: 15 (ref 11–26)
BUN: 13 mg/dL (ref 8–27)
Bilirubin Total: 0.3 mg/dL (ref 0.0–1.2)
CALCIUM: 9.2 mg/dL (ref 8.7–10.3)
CO2: 26 mmol/L (ref 18–29)
Chloride: 93 mmol/L — ABNORMAL LOW (ref 97–108)
Creatinine, Ser: 0.85 mg/dL (ref 0.57–1.00)
GFR calc non Af Amer: 64 mL/min/{1.73_m2} (ref >59)
GFR, EST AFRICAN AMERICAN: 74 mL/min/{1.73_m2} (ref >59)
Globulin, Total: 2.8 g/dL (ref 1.5–4.5)
Glucose: 139 mg/dL — ABNORMAL HIGH (ref 65–99)
POTASSIUM: 4.1 mmol/L (ref 3.5–5.2)
Sodium: 134 mmol/L (ref 134–144)
TOTAL PROTEIN: 6.6 g/dL (ref 6.0–8.5)

## 2015-05-04 LAB — B12 AND FOLATE PANEL
Folate: >20 ng/mL (ref >3.0)
Vitamin B-12: 1139 pg/mL — ABNORMAL HIGH (ref 211–946)

## 2015-05-04 NOTE — Telephone Encounter (Signed)
Called patient to inform EEG scheduled @ [facility] as requested on 05/06/15 @ 2:30p.m. Patient would need to enter through admissions. No answer.

## 2015-05-05 NOTE — Telephone Encounter (Signed)
-----   Message from Melvenia Beam, MD sent at 05/04/2015  7:13 PM EDT ----- Please call Call grandaughter leslie at [phone] with results. Labs were within notmal limits. thanks

## 2015-05-05 NOTE — Telephone Encounter (Signed)
Spoke with Magda Paganini about labs being within normal limits per Dr. Jaynee Eagles. She verbalized understanding. Also told her that Larey Seat got her scheduled for EEG at [facility] tomorrow at 230pm. She verbalized understanding and has their phone number to call if they need to reschedule.

## 2015-05-06 ENCOUNTER — Ambulatory Visit (HOSPITAL_COMMUNITY)
Admission: RE | Admit: 2015-05-06 | Discharge: 2015-05-06 | Disposition: A | Discharge status: Home / Self Care | Payer: Medicare Other | Source: Ambulatory Visit | Attending: Neurology | Admitting: Neurology

## 2015-05-06 ENCOUNTER — Encounter: Payer: Self-pay | Admitting: Neurology

## 2015-05-06 DIAGNOSIS — G9349 Other encephalopathy: Secondary | ICD-10-CM | POA: Insufficient documentation

## 2015-05-06 DIAGNOSIS — F0391 Unspecified dementia with behavioral disturbance: Secondary | ICD-10-CM | POA: Insufficient documentation

## 2015-05-06 DIAGNOSIS — Z79899 Other long term (current) drug therapy: Secondary | ICD-10-CM | POA: Insufficient documentation

## 2015-05-06 DIAGNOSIS — F03918 Unspecified dementia, unspecified severity, with other behavioral disturbance: Secondary | ICD-10-CM | POA: Insufficient documentation

## 2015-05-06 DIAGNOSIS — R4182 Altered mental status, unspecified: Secondary | ICD-10-CM | POA: Insufficient documentation

## 2015-05-06 DIAGNOSIS — R410 Disorientation, unspecified: Secondary | ICD-10-CM

## 2015-05-06 DIAGNOSIS — F05 Delirium due to known physiological condition: Secondary | ICD-10-CM

## 2015-05-06 NOTE — Progress Notes (Signed)
EEG Completed; Results Pending  

## 2015-05-09 ENCOUNTER — Ambulatory Visit (HOSPITAL_COMMUNITY)
Admission: RE | Admit: 2015-05-09 | Discharge: 2015-05-09 | Disposition: A | Discharge status: Home / Self Care | Payer: Medicare Other | Source: Ambulatory Visit | Attending: Neurology | Admitting: Neurology

## 2015-05-09 DIAGNOSIS — F05 Delirium due to known physiological condition: Secondary | ICD-10-CM

## 2015-05-09 DIAGNOSIS — G319 Degenerative disease of nervous system, unspecified: Secondary | ICD-10-CM | POA: Insufficient documentation

## 2015-05-09 DIAGNOSIS — R9082 White matter disease, unspecified: Secondary | ICD-10-CM | POA: Insufficient documentation

## 2015-05-09 DIAGNOSIS — R410 Disorientation, unspecified: Secondary | ICD-10-CM

## 2015-05-09 NOTE — Procedures (Signed)
[place] A. Merlene Laughter, MD     [email or web address]           HISTORY: The patient is an 81 year old female who presents with altered mental status. The study is being done to evaluate for seizures as a cause of these confusional episodes.  MEDICATIONS: Scheduled Meds: Continuous Infusions: PRN Meds:.  Prior to Admission medications   Medication Sig Start Date End Date Taking? Authorizing Provider  acetaminophen (TYLENOL) 500 MG tablet Take 500-1,000 mg by mouth every 6 (six) hours as needed (pain(Max 5 tablets per 24hours)).     Historical Provider, MD  acidophilus (RISAQUAD) CAPS capsule Take 1 capsule by mouth daily.    Historical Provider, MD  allopurinol (ZYLOPRIM) 100 MG tablet TAKE ONE TABLET BY MOUTH ONCE DAILY. 03/24/13   Kathyrn Drown, MD  amitriptyline (ELAVIL) 75 MG tablet TAKE (1) TABLET BY MOUTH AT BEDTIME. 06/22/13   Kathyrn Drown, MD  atorvastatin (LIPITOR) 20 MG tablet TAKE (1) TABLET BY MOUTH AT BEDTIME. 03/24/13   Kathyrn Drown, MD  citalopram (CELEXA) 10 MG tablet TAKE ONE TABLET BY MOUTH ONCE DAILY. 03/24/13   Kathyrn Drown, MD  diltiazem (CARDIZEM CD) 180 MG 24 hr capsule Take 180 mg by mouth daily. 04/29/15   Historical Provider, MD  donepezil (ARICEPT) 10 MG tablet Take 10 mg by mouth at bedtime.    Historical Provider, MD  glucose blood (ACCU-CHEK AVIVA) test strip 1 each by Other route as needed for other. Use as instructed    Historical Provider, MD  Lancets (ACCU-CHEK SOFT TOUCH) lancets 1 each by Other route as needed for other. Use as instructed    Historical Provider, MD  levothyroxine (SYNTHROID, LEVOTHROID) 112 MCG tablet Take 1 tablet (112 mcg total) by mouth daily. 11/03/13   Kathyrn Drown, MD  linagliptin (TRADJENTA) 5 MG TABS tablet Take 5 mg by mouth daily.    Historical Provider, MD  LORazepam (ATIVAN) 2 MG tablet Take 2 mg by mouth 5 (five) times daily as needed for anxiety.    Historical Provider, MD    losartan-hydrochlorothiazide (HYZAAR) 100-12.5 MG per tablet TAKE ONE TABLET BY MOUTH ONCE DAILY. 03/24/13   Kathyrn Drown, MD  Multiple Vitamins-Minerals (PRESERVISION/LUTEIN PO) Take 1 capsule by mouth 2 (two) times daily with a meal.    Historical Provider, MD  NAMENDA 10 MG tablet TAKE (1) TABLET BY MOUTH TWICE DAILY. 03/24/13   Kathyrn Drown, MD  nystatin-triamcinolone (MYCOLOG II) cream Apply 1 application topically 2 (two) times daily as needed (rash).  12/11/12   Historical Provider, MD  risperiDONE (RISPERDAL) 0.5 MG tablet Take 0.5 mg by mouth 2 (two) times daily.     Historical Provider, MD  traZODone (DESYREL) 50 MG tablet Take 25 mg by mouth at bedtime.    Historical Provider, MD  trimethoprim (TRIMPEX) 100 MG tablet Take 1 tablet (100 mg total) by mouth daily. 03/08/15   Sinda Du, MD      ANALYSIS: A 16 channel recording using standard 10 20 measurements is conducted for 21 minutes. There is a posterior dominant rhythm of 6-61/2 Hz which attenuates with eye opening. There is beta activity observed in the frontal areas. Awake and drowsy activities are observed. Photic stimulation and hyperventilation are not carried out. There is no focal or lateral slowing. There is no epileptiform activity is observed.   IMPRESSION:  This is an abnormal recording showing moderate global slowing indicating a moderate global encephalopathy. However, there is no epileptiform  activity observed.      Kofi A. Merlene Laughter, M.D.  Diplomate, Tax adviser of Psychiatry and Neurology ( Neurology).

## 2015-05-11 ENCOUNTER — Telehealth: Payer: Self-pay | Admitting: *Deleted

## 2015-05-11 NOTE — Telephone Encounter (Signed)
Called and spoke with [name] about EEG results. Per Dr. Jaynee Eagles, EEG did not show any seizures. It did show global slowing, which is consistent with a dementing illness. CT head did not show anything to explain her recent decline. She should f/u closely with her PCP to ensure no other reasons for her cognitive changes, that there is no infection or other underlying illness. If no other reason is found by her PCP, it appears it is a progression of her dementia. She asked what her last WBC count was and I advised it was 8.8. She said she does have frequent UTI's and she actually just got over one. She is going to have her f/u with her PCP.

## 2015-05-11 NOTE — Telephone Encounter (Signed)
-----   Message from Melvenia Beam, MD sent at 05/11/2015 12:34 PM EDT ----- Terrence Dupont please let patient's family(Call grandaughter leslie at [phone] with results)  know that the EEG did not show seizures but it did show global slowing which is consistent with a dementing illness. The CT of her head did not show anything new to explain her recent decline. She should follow closely with her primary care to ensure no other reasons for her cognitive changes, that there is no infection or other underlying illness. If no other reason is found by her pcp, It appears this is a progression of her dementia. Thanks,

## 2015-05-23 ENCOUNTER — Other Ambulatory Visit: Payer: Medicare Other

## 2015-05-24 ENCOUNTER — Encounter: Payer: Self-pay | Admitting: Neurology

## 2015-07-06 ENCOUNTER — Encounter (HOSPITAL_COMMUNITY): Payer: Self-pay | Admitting: Emergency Medicine

## 2015-07-06 ENCOUNTER — Inpatient Hospital Stay (HOSPITAL_COMMUNITY)
Admission: EM | Admit: 2015-07-06 | Discharge: 2015-07-08 | DRG: 690 | Disposition: A | Discharge status: Nursing Home (Includes ICF) | Payer: Medicare Other | Attending: Pulmonary Disease | Admitting: Pulmonary Disease

## 2015-07-06 ENCOUNTER — Emergency Department (HOSPITAL_COMMUNITY): Payer: Medicare Other

## 2015-07-06 DIAGNOSIS — M109 Gout, unspecified: Secondary | ICD-10-CM | POA: Diagnosis present

## 2015-07-06 DIAGNOSIS — E039 Hypothyroidism, unspecified: Secondary | ICD-10-CM | POA: Diagnosis present

## 2015-07-06 DIAGNOSIS — Z66 Do not resuscitate: Secondary | ICD-10-CM | POA: Diagnosis present

## 2015-07-06 DIAGNOSIS — Z8551 Personal history of malignant neoplasm of bladder: Secondary | ICD-10-CM

## 2015-07-06 DIAGNOSIS — Z833 Family history of diabetes mellitus: Secondary | ICD-10-CM

## 2015-07-06 DIAGNOSIS — I4891 Unspecified atrial fibrillation: Secondary | ICD-10-CM | POA: Diagnosis present

## 2015-07-06 DIAGNOSIS — Z85820 Personal history of malignant melanoma of skin: Secondary | ICD-10-CM | POA: Diagnosis not present

## 2015-07-06 DIAGNOSIS — E860 Dehydration: Secondary | ICD-10-CM

## 2015-07-06 DIAGNOSIS — I959 Hypotension, unspecified: Secondary | ICD-10-CM

## 2015-07-06 DIAGNOSIS — N390 Urinary tract infection, site not specified: Principal | ICD-10-CM

## 2015-07-06 DIAGNOSIS — Z803 Family history of malignant neoplasm of breast: Secondary | ICD-10-CM | POA: Diagnosis not present

## 2015-07-06 DIAGNOSIS — F0280 Dementia in other diseases classified elsewhere without behavioral disturbance: Secondary | ICD-10-CM

## 2015-07-06 DIAGNOSIS — G309 Alzheimer's disease, unspecified: Secondary | ICD-10-CM

## 2015-07-06 DIAGNOSIS — E785 Hyperlipidemia, unspecified: Secondary | ICD-10-CM | POA: Diagnosis present

## 2015-07-06 DIAGNOSIS — R4182 Altered mental status, unspecified: Secondary | ICD-10-CM | POA: Diagnosis not present

## 2015-07-06 DIAGNOSIS — M1990 Unspecified osteoarthritis, unspecified site: Secondary | ICD-10-CM | POA: Diagnosis present

## 2015-07-06 DIAGNOSIS — E119 Type 2 diabetes mellitus without complications: Secondary | ICD-10-CM | POA: Diagnosis present

## 2015-07-06 DIAGNOSIS — R001 Bradycardia, unspecified: Secondary | ICD-10-CM | POA: Diagnosis present

## 2015-07-06 DIAGNOSIS — I10 Essential (primary) hypertension: Secondary | ICD-10-CM | POA: Diagnosis present

## 2015-07-06 LAB — COMPREHENSIVE METABOLIC PANEL
ALT: 13 U/L — AB (ref 14–54)
AST: 16 U/L (ref 15–41)
Albumin: 3.3 g/dL — ABNORMAL LOW (ref 3.5–5.0)
Alkaline Phosphatase: 68 U/L (ref 38–126)
Anion gap: 8 (ref 5–15)
BUN: 16 mg/dL (ref 6–20)
CALCIUM: 9.1 mg/dL (ref 8.9–10.3)
CHLORIDE: 101 mmol/L (ref 101–111)
CO2: 27 mmol/L (ref 22–32)
CREATININE: 1.14 mg/dL — AB (ref 0.44–1.00)
GFR, EST AFRICAN AMERICAN: 50 mL/min — AB (ref >60)
GFR, EST NON AFRICAN AMERICAN: 43 mL/min — AB (ref >60)
Glucose, Bld: 168 mg/dL — ABNORMAL HIGH (ref 65–99)
Potassium: 3.9 mmol/L (ref 3.5–5.1)
Sodium: 136 mmol/L (ref 135–145)
Total Bilirubin: 0.6 mg/dL (ref 0.3–1.2)
Total Protein: 6.3 g/dL — ABNORMAL LOW (ref 6.5–8.1)

## 2015-07-06 LAB — GLUCOSE, CAPILLARY: GLUCOSE-CAPILLARY: 149 mg/dL — AB (ref 65–99)

## 2015-07-06 LAB — URINALYSIS, ROUTINE W REFLEX MICROSCOPIC
BILIRUBIN URINE: NEGATIVE
Glucose, UA: NEGATIVE mg/dL
Hgb urine dipstick: NEGATIVE
KETONES UR: NEGATIVE mg/dL
NITRITE: NEGATIVE
Protein, ur: NEGATIVE mg/dL
SPECIFIC GRAVITY, URINE: 1.015 (ref 1.005–1.030)
pH: 6.5 (ref 5.0–8.0)

## 2015-07-06 LAB — CBC WITH DIFFERENTIAL/PLATELET
Basophils Absolute: 0 10*3/uL (ref 0.0–0.1)
Basophils Relative: 1 %
EOS ABS: 0 10*3/uL (ref 0.0–0.7)
Eosinophils Relative: 1 %
HEMATOCRIT: 34.3 % — AB (ref 36.0–46.0)
HEMOGLOBIN: 11.6 g/dL — AB (ref 12.0–15.0)
LYMPHS ABS: 0.9 10*3/uL (ref 0.7–4.0)
LYMPHS PCT: 15 %
MCH: 30 pg (ref 26.0–34.0)
MCHC: 33.8 g/dL (ref 30.0–36.0)
MCV: 88.6 fL (ref 78.0–100.0)
MONOS PCT: 8 %
Monocytes Absolute: 0.5 10*3/uL (ref 0.1–1.0)
NEUTROS ABS: 4.8 10*3/uL (ref 1.7–7.7)
NEUTROS PCT: 75 %
Platelets: 156 10*3/uL (ref 150–400)
RBC: 3.87 MIL/uL (ref 3.87–5.11)
RDW: 13.9 % (ref 11.5–15.5)
WBC: 6.3 10*3/uL (ref 4.0–10.5)

## 2015-07-06 LAB — TROPONIN I

## 2015-07-06 LAB — CBG MONITORING, ED: GLUCOSE-CAPILLARY: 155 mg/dL — AB (ref 65–99)

## 2015-07-06 LAB — URINE MICROSCOPIC-ADD ON

## 2015-07-06 LAB — I-STAT CG4 LACTIC ACID, ED: LACTIC ACID, VENOUS: 1.27 mmol/L (ref 0.5–2.0)

## 2015-07-06 LAB — PROTIME-INR
INR: 1.15 (ref 0.00–1.49)
Prothrombin Time: 14.9 seconds (ref 11.6–15.2)

## 2015-07-06 MED ORDER — ALLOPURINOL 100 MG PO TABS
100.0000 mg | ORAL_TABLET | Freq: Every day | ORAL | Status: DC
Start: 2015-07-07 — End: 2015-07-08
  Administered 2015-07-07 – 2015-07-08 (×2): 100 mg via ORAL
  Filled 2015-07-06 (×2): qty 1

## 2015-07-06 MED ORDER — DEXTROSE 5 % IV SOLN
1.0000 g | INTRAVENOUS | Status: DC
  Administered 2015-07-07: 1 g via INTRAVENOUS
  Filled 2015-07-06 (×4): qty 10

## 2015-07-06 MED ORDER — AMITRIPTYLINE HCL 75 MG PO TABS: 75.0000 mg | ORAL_TABLET | Freq: Every day | ORAL | Status: DC

## 2015-07-06 MED ORDER — ACETAMINOPHEN 500 MG PO TABS: 500.0000 mg | ORAL_TABLET | Freq: Four times a day (QID) | ORAL | Status: DC | PRN

## 2015-07-06 MED ORDER — RISPERIDONE 0.5 MG PO TABS
0.5000 mg | ORAL_TABLET | Freq: Two times a day (BID) | ORAL | Status: DC
  Administered 2015-07-06 – 2015-07-08 (×4): 0.5 mg via ORAL
  Filled 2015-07-06 (×4): qty 1

## 2015-07-06 MED ORDER — SODIUM CHLORIDE 0.9 % IV BOLUS (SEPSIS): 500.0000 mL | Freq: Once | INTRAVENOUS | Status: DC

## 2015-07-06 MED ORDER — LINAGLIPTIN 5 MG PO TABS
5.0000 mg | ORAL_TABLET | Freq: Every day | ORAL | Status: DC
  Administered 2015-07-07 – 2015-07-08 (×2): 5 mg via ORAL
  Filled 2015-07-06 (×2): qty 1

## 2015-07-06 MED ORDER — CITALOPRAM HYDROBROMIDE 10 MG PO TABS
10.0000 mg | ORAL_TABLET | Freq: Every day | ORAL | Status: DC
  Filled 2015-07-06 (×3): qty 1

## 2015-07-06 MED ORDER — INSULIN ASPART 100 UNIT/ML ~~LOC~~ SOLN: 0.0000 [IU] | Freq: Three times a day (TID) | SUBCUTANEOUS | Status: DC

## 2015-07-06 MED ORDER — INSULIN ASPART 100 UNIT/ML ~~LOC~~ SOLN: 0.0000 [IU] | Freq: Every day | SUBCUTANEOUS | Status: DC

## 2015-07-06 MED ORDER — MEMANTINE HCL 10 MG PO TABS
10.0000 mg | ORAL_TABLET | Freq: Two times a day (BID) | ORAL | Status: DC
  Administered 2015-07-07 – 2015-07-08 (×2): 10 mg via ORAL
  Filled 2015-07-06 (×3): qty 1

## 2015-07-06 MED ORDER — DEXTROSE 5 % IV SOLN: 1.0000 g | INTRAVENOUS | Status: DC

## 2015-07-06 MED ORDER — LORAZEPAM 0.5 MG PO TABS
1.0000 mg | ORAL_TABLET | Freq: Every day | ORAL | Status: DC
  Administered 2015-07-07 – 2015-07-08 (×2): 1 mg via ORAL
  Filled 2015-07-06 (×2): qty 2

## 2015-07-06 MED ORDER — MEMANTINE HCL 10 MG PO TABS
10.0000 mg | ORAL_TABLET | Freq: Two times a day (BID) | ORAL | Status: DC
  Administered 2015-07-06 – 2015-07-07 (×2): 10 mg via ORAL
  Filled 2015-07-06: qty 1

## 2015-07-06 MED ORDER — SODIUM CHLORIDE 0.9 % IV SOLN
INTRAVENOUS | Status: DC
  Administered 2015-07-07 – 2015-07-08 (×4): via INTRAVENOUS

## 2015-07-06 MED ORDER — ENOXAPARIN SODIUM 40 MG/0.4ML ~~LOC~~ SOLN
40.0000 mg | SUBCUTANEOUS | Status: DC
  Administered 2015-07-06 – 2015-07-07 (×2): 40 mg via SUBCUTANEOUS
  Filled 2015-07-06 (×2): qty 0.4

## 2015-07-06 MED ORDER — SODIUM CHLORIDE 0.9 % IV BOLUS (SEPSIS)
500.0000 mL | Freq: Once | INTRAVENOUS | Status: AC
  Administered 2015-07-06: 500 mL via INTRAVENOUS

## 2015-07-06 MED ORDER — TRAZODONE HCL 50 MG PO TABS
25.0000 mg | ORAL_TABLET | Freq: Every day | ORAL | Status: DC
  Administered 2015-07-06 – 2015-07-07 (×2): 25 mg via ORAL
  Filled 2015-07-06 (×2): qty 1

## 2015-07-06 MED ORDER — SODIUM CHLORIDE 0.9 % IJ SOLN
3.0000 mL | Freq: Two times a day (BID) | INTRAMUSCULAR | Status: DC
  Administered 2015-07-06 – 2015-07-08 (×3): 3 mL via INTRAVENOUS

## 2015-07-06 MED ORDER — CEFTRIAXONE SODIUM 1 G IJ SOLR
1.0000 g | Freq: Once | INTRAMUSCULAR | Status: AC
  Administered 2015-07-06: 1 g via INTRAVENOUS
  Filled 2015-07-06: qty 10

## 2015-07-06 MED ORDER — DONEPEZIL HCL 5 MG PO TABS
10.0000 mg | ORAL_TABLET | Freq: Every day | ORAL | Status: DC
  Administered 2015-07-06 – 2015-07-07 (×2): 10 mg via ORAL
  Filled 2015-07-06 (×2): qty 1
  Filled 2015-07-06 (×2): qty 2

## 2015-07-06 MED ORDER — LEVOTHYROXINE SODIUM 112 MCG PO TABS
112.0000 ug | ORAL_TABLET | Freq: Every day | ORAL | Status: DC
  Administered 2015-07-07 – 2015-07-08 (×2): 112 ug via ORAL
  Filled 2015-07-06 (×2): qty 1

## 2015-07-06 MED ORDER — ATORVASTATIN CALCIUM 20 MG PO TABS
20.0000 mg | ORAL_TABLET | Freq: Every day | ORAL | Status: DC
  Filled 2015-07-06: qty 1

## 2015-07-06 NOTE — ED Notes (Signed)
Seizure pads placed on bed

## 2015-07-06 NOTE — ED Provider Notes (Signed)
CSN: [id number]     Arrival date & time 07/06/15  1322 History   First MD Initiated Contact with Patient 07/06/15 1336     Chief Complaint  Patient presents with  . Altered Mental Status   HPI Patient presents to the emergency room with complaints of an episode of altered mental status.  The patient has a history of dementia. She lives at [facility] and has a family member who is a Marine scientist provide home care for her. According to the family, the patient complained of an episode of chest pain and shortness of breath around 12:15 today. Time after that she had an episode where it seems as if she had a seizure. Family states she had 4 episodes of shaking and unresponsiveness. EMS was called and when they arrived they did not notice any seizure activity.  Patient's symptoms have resolved. She is now awake and alert and talkative. Family states she is at her baseline. She will normally answer questions but she is very confused because of her dementia. He does have some questionable history of seizures in the past. She was evaluated by a neurologist and had an EEG test. Patient denies any trouble with any chest pain or shortness of breath. No vomiting or diarrhea. No fevers or chills. Past Medical History  Diagnosis Date  . Thyroid disease   . Hypertension   . Gout   . Dementia   . Vertigo   . Low back pain   . Hypothyroidism   . Arthritis   . Hyperlipidemia   . Hemorrhoids with complication   . Cancer [facility]) 2012    Melanoma-Face Removed at [facility]  . Bladder cancer [facility])     saw dr. Rosana Hoes had surgery had follow about 3 years ago  . Atrial fibrillation ([place])   . Migraine   . Depression   . Hyperglycemia   . Alzheimer disease   . Atrial fibrillation [facility])    Past Surgical History  Procedure Laterality Date  . Cholecystectomy    . Colonoscopy  11/21/09    Simple adenoma/pancolonic diverticulosis(next on due in 5/10 years)  . Eye surgery      Bilateral Cataract Surgery  .  Flexible sigmoidoscopy  12/03/2011    Procedure: FLEXIBLE SIGMOIDOSCOPY;  Surgeon: Danie Binder, MD;  Location: [facility];  Service: Endoscopy;  Laterality: N/A;  12:00  . Bladder surgery      remove cancer  . Melanoma excision      on face  . Dementia     Family History  Problem Relation Age of Onset  . Migraines Mother   . Arthritis Father   . Diabetes Father   . Cancer Brother   . Diabetes Brother   . Breast cancer Daughter   . Diabetes Brother   . Dementia Neg Hx    Social History  Substance Use Topics  . Smoking status: Never Smoker   . Smokeless tobacco: Never Used  . Alcohol Use: No   OB History    No data available     Review of Systems  Constitutional: Negative for fever.  Cardiovascular: Negative for leg swelling.  Gastrointestinal: Negative for abdominal pain.  Genitourinary: Negative for dysuria.  Neurological: Positive for seizures and syncope. Negative for tremors, speech difficulty, weakness and numbness.       Patient did fall a couple weeks ago  All other systems reviewed and are negative.     Allergies  Sulfonamide derivatives  Home Medications  Prior to Admission medications   Medication Sig Start Date End Date Taking? Authorizing Provider  acidophilus (RISAQUAD) CAPS capsule Take 1 capsule by mouth daily.   Yes Historical Provider, MD  allopurinol (ZYLOPRIM) 100 MG tablet TAKE ONE TABLET BY MOUTH ONCE DAILY. 03/24/13  Yes Kathyrn Drown, MD  atorvastatin (LIPITOR) 20 MG tablet TAKE (1) TABLET BY MOUTH AT BEDTIME. 03/24/13  Yes Kathyrn Drown, MD  ciprofloxacin (CIPRO) 250 MG tablet Take 250 mg by mouth 2 (two) times daily. 06/27/15  Yes Historical Provider, MD  citalopram (CELEXA) 10 MG tablet TAKE ONE TABLET BY MOUTH ONCE DAILY. 03/24/13  Yes Kathyrn Drown, MD  diltiazem (CARDIZEM CD) 180 MG 24 hr capsule Take 180 mg by mouth daily. 04/29/15  Yes Historical Provider, MD  donepezil (ARICEPT) 10 MG tablet Take 10 mg by mouth at bedtime.    Yes Historical Provider, MD  glucose blood (ACCU-CHEK AVIVA) test strip 1 each by Other route as needed for other. Use as instructed   Yes Historical Provider, MD  Lancets (ACCU-CHEK SOFT TOUCH) lancets 1 each by Other route as needed for other. Use as instructed   Yes Historical Provider, MD  levothyroxine (SYNTHROID, LEVOTHROID) 112 MCG tablet Take 1 tablet (112 mcg total) by mouth daily. 11/03/13  Yes Kathyrn Drown, MD  linagliptin (TRADJENTA) 5 MG TABS tablet Take 5 mg by mouth daily.   Yes Historical Provider, MD  LORazepam (ATIVAN) 2 MG tablet Take 1 mg by mouth daily.    Yes Historical Provider, MD  losartan-hydrochlorothiazide (HYZAAR) 100-12.5 MG per tablet TAKE ONE TABLET BY MOUTH ONCE DAILY. 03/24/13  Yes Kathyrn Drown, MD  memantine (NAMENDA) 10 MG tablet Take 10 mg by mouth 2 (two) times daily.   Yes Historical Provider, MD  Multiple Vitamins-Minerals (PRESERVISION/LUTEIN PO) Take 1 capsule by mouth 2 (two) times daily with a meal.   Yes Historical Provider, MD  NAMENDA 10 MG tablet TAKE (1) TABLET BY MOUTH TWICE DAILY. 03/24/13  Yes Kathyrn Drown, MD  risperiDONE (RISPERDAL) 0.5 MG tablet Take 0.5 mg by mouth 2 (two) times daily.    Yes Historical Provider, MD  traZODone (DESYREL) 50 MG tablet Take 25 mg by mouth at bedtime.   Yes Historical Provider, MD  trimethoprim (TRIMPEX) 100 MG tablet Take 1 tablet (100 mg total) by mouth daily. 03/08/15  Yes Sinda Du, MD  acetaminophen (TYLENOL) 500 MG tablet Take 500-1,000 mg by mouth every 6 (six) hours as needed (pain(Max 5 tablets per 24hours)).     Historical Provider, MD  amitriptyline (ELAVIL) 75 MG tablet TAKE (1) TABLET BY MOUTH AT BEDTIME. Patient not taking: Reported on 07/06/2015 06/22/13   Kathyrn Drown, MD  nystatin-triamcinolone (MYCOLOG II) cream Apply 1 application topically 2 (two) times daily as needed (rash).  12/11/12   Historical Provider, MD   BP 98/61 mmHg  Pulse 57  Temp(Src) 97.7 F (36.5 C) (Oral)  Resp 18   Wt 77.111 kg  SpO2 97% Physical Exam  Constitutional: No distress.  HENT:  Head: Normocephalic.  Right Ear: External ear normal.  Left Ear: External ear normal.  Old bruise noted around the face  Eyes: Conjunctivae are normal. Right eye exhibits no discharge. Left eye exhibits no discharge. No scleral icterus.  Neck: Neck supple. No tracheal deviation present.  Cardiovascular: Normal rate, regular rhythm and intact distal pulses.   Pulmonary/Chest: Effort normal and breath sounds normal. No stridor. No respiratory distress. She has no wheezes. She has  no rales.  Abdominal: Soft. Bowel sounds are normal. She exhibits no distension. There is no tenderness. There is no rebound and no guarding.  Musculoskeletal: She exhibits no edema or tenderness.  Neurological: She is alert. She displays no tremor. No cranial nerve deficit (no facial droop, extraocular movements intact, no slurred speech) or sensory deficit. She exhibits normal muscle tone. She displays no seizure activity. GCS eye subscore is 4. GCS verbal subscore is 4. GCS motor subscore is 6.  Patient follows commands, generalized weakness however she has equal grip strength bilaterally, she is able to move both lower extremities  Skin: Skin is warm and dry. No rash noted.  Psychiatric: She has a normal mood and affect.  Nursing note and vitals reviewed.   ED Course  Procedures (including critical care time) Labs Review Labs Reviewed  CBC WITH DIFFERENTIAL/PLATELET - Abnormal; Notable for the following:    Hemoglobin 11.6 (*)    HCT 34.3 (*)    All other components within normal limits  COMPREHENSIVE METABOLIC PANEL - Abnormal; Notable for the following:    Glucose, Bld 168 (*)    Creatinine, Ser 1.14 (*)    Total Protein 6.3 (*)    Albumin 3.3 (*)    ALT 13 (*)    GFR calc non Af Amer 43 (*)    GFR calc Af Amer 50 (*)    All other components within normal limits  URINALYSIS, ROUTINE W REFLEX MICROSCOPIC (NOT AT [facility] -  Abnormal; Notable for the following:    Leukocytes, UA LARGE (*)    All other components within normal limits  URINE MICROSCOPIC-ADD ON - Abnormal; Notable for the following:    Squamous Epithelial / LPF 6-30 (*)    Bacteria, UA FEW (*)    All other components within normal limits  CBG MONITORING, ED - Abnormal; Notable for the following:    Glucose-Capillary 155 (*)    All other components within normal limits  URINE CULTURE  TROPONIN I  PROTIME-INR  I-STAT CG4 LACTIC ACID, ED    Imaging Review Ct Head Wo Contrast  07/06/2015  CLINICAL DATA:  Altered mental status. Shortness of breath and chest pain. Seizure? EXAM: CT HEAD WITHOUT CONTRAST TECHNIQUE: Contiguous axial images were obtained from the base of the skull through the vertex without intravenous contrast. COMPARISON:  Head CT dated 05/09/2015 FINDINGS: Again noted is generalized brain atrophy with commensurate dilatation of the ventricles and sulci. Patchy areas of low density are again seen within the bilateral periventricular and subcortical white matter regions compatible with changes of chronic small vessel ischemia. 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 There is no mass, hemorrhage, edema, or other evidence of acute parenchymal abnormality. No extra-axial hemorrhage. There are chronic calcified atherosclerotic changes of the large vessels at the skull base. No focal osseous abnormality. Visualized upper paranasal sinuses are clear. Mastoid air cells are clear. Superficial soft tissues are unremarkable. IMPRESSION: 1. Atrophy and chronic ischemic changes as detailed above. 2. No evidence of acute intracranial abnormality. No intracranial mass, hemorrhage, or edema. Electronically Signed   By: Franki Cabot M.D.   On: 07/06/2015 14:59   I have personally reviewed and evaluated these images and lab results as part of my medical decision-making.  Cardiac monitoring: sinus rhythm, narrow complex QRS  on the monitor  MDM   Final diagnoses:  UTI (lower urinary tract infection)  Hypotension, unspecified hypotension type  Dehydration   Patient's laboratory tests suggest a urinary  tract infection. She has had episodes of hypotension however she is not febrile, she does not have an elevated white blood cell count, she does not have an elevated lactic acid level. Overall have a low suspicion for sepsis. Possible that dehydration may be contributing to the low blood pressure. Her BUN and creatinine are slightly elevated.   Patient does maintain a normal heart rate in the emergency room. Unclear whether she had a syncopal episode or seizure. Plan on admission to the hospital for IV antibiotics and further monitoring.     Dorie Rank, MD 07/06/15 [phone]

## 2015-07-06 NOTE — ED Notes (Signed)
MD made aware of BP. 78/54

## 2015-07-06 NOTE — ED Notes (Signed)
Patient arrives from [facility] with c/o altered mental status. Patient was complaining of CP, shortness of breath around 1215 today. Staff reports 4 seizures in home prior to EMS arrival. No seizure like activity noted with EMS. Pupils pinpoint. Pt with old bruising on nasal bridge and bilateral eyes. H/o falls and advanced dementia. Patient denies pain. Opens eyes to voice. GCS 13.

## 2015-07-06 NOTE — H&P (Signed)
[facility] History and Physical  [name] [id number] DOB: 02-19-1930 DOA: 07/06/2015  Referring physician: ER PCP: Alonza Bogus, MD   Chief Complaint: Altered mental status  HPI: [name] is a 81 y.o. female  This is an 81 year old lady who has had progressive and worsening dementia over the last 6 months he now presents with an episode of sudden onset of altered mental status. It appears that she had an episode earlier today resembling possible seizure. She fell forwards and has some bruising on her face. She also was seen by her primary care physician, Dr. Luan Pulling, and was being treated for UTI. She presents the emergency room with hypotension, no fever, no leukocytosis but urinalysis suggestive of UTI. The patient cannot give me any clear history at all due to her dementia. Her dementia requires help with most activities of daily living including dressing, bathing, feeding. She is able to walk but needs assistance. Her swallowing is apparently intact. This information was obtained from the daughter-in-law at the bedside.   Review of Systems:  Unable to obtain secondary to the patient's altered mental status.  Past Medical History  Diagnosis Date  . Thyroid disease   . Hypertension   . Gout   . Dementia   . Vertigo   . Low back pain   . Hypothyroidism   . Arthritis   . Hyperlipidemia   . Hemorrhoids with complication   . Cancer [facility]) 2012    Melanoma-Face Removed at [facility]  . Bladder cancer [facility])     saw dr. Rosana Hoes had surgery had follow about 3 years ago  . Atrial fibrillation ([place])   . Migraine   . Depression   . Hyperglycemia   . Alzheimer disease   . Atrial fibrillation [facility])    Past Surgical History  Procedure Laterality Date  . Cholecystectomy    . Colonoscopy  11/21/09    Simple adenoma/pancolonic diverticulosis(next on due in 5/10 years)  . Eye surgery      Bilateral Cataract Surgery  . Flexible sigmoidoscopy  12/03/2011   Procedure: FLEXIBLE SIGMOIDOSCOPY;  Surgeon: Danie Binder, MD;  Location: [facility]O [facility];  Service: Endoscopy;  Laterality: N/A;  12:00  . Bladder surgery      remove cancer  . Melanoma excision      on face  . Dementia     Social History:  reports that she has never smoked. She has never used smokeless tobacco. She reports that she does not drink alcohol or use illicit drugs.  Allergies  Allergen Reactions  . Sulfonamide Derivatives Anaphylaxis, Swelling and Rash    Family History  Problem Relation Age of Onset  . Migraines Mother   . Arthritis Father   . Diabetes Father   . Cancer Brother   . Diabetes Brother   . Breast cancer Daughter   . Diabetes Brother   . Dementia Neg Hx       Prior to Admission medications   Medication Sig Start Date End Date Taking? Authorizing Provider  acidophilus (RISAQUAD) CAPS capsule Take 1 capsule by mouth daily.   Yes Historical Provider, MD  allopurinol (ZYLOPRIM) 100 MG tablet TAKE ONE TABLET BY MOUTH ONCE DAILY. 03/24/13  Yes Kathyrn Drown, MD  atorvastatin (LIPITOR) 20 MG tablet TAKE (1) TABLET BY MOUTH AT BEDTIME. 03/24/13  Yes Kathyrn Drown, MD  ciprofloxacin (CIPRO) 250 MG tablet Take 250 mg by mouth 2 (two) times daily. 06/27/15  Yes Historical Provider, MD  citalopram (CELEXA) 10  MG tablet TAKE ONE TABLET BY MOUTH ONCE DAILY. 03/24/13  Yes Kathyrn Drown, MD  diltiazem (CARDIZEM CD) 180 MG 24 hr capsule Take 180 mg by mouth daily. 04/29/15  Yes Historical Provider, MD  donepezil (ARICEPT) 10 MG tablet Take 10 mg by mouth at bedtime.   Yes Historical Provider, MD  glucose blood (ACCU-CHEK AVIVA) test strip 1 each by Other route as needed for other. Use as instructed   Yes Historical Provider, MD  Lancets (ACCU-CHEK SOFT TOUCH) lancets 1 each by Other route as needed for other. Use as instructed   Yes Historical Provider, MD  levothyroxine (SYNTHROID, LEVOTHROID) 112 MCG tablet Take 1 tablet (112 mcg total) by mouth daily. 11/03/13  Yes  Kathyrn Drown, MD  linagliptin (TRADJENTA) 5 MG TABS tablet Take 5 mg by mouth daily.   Yes Historical Provider, MD  LORazepam (ATIVAN) 2 MG tablet Take 1 mg by mouth daily.    Yes Historical Provider, MD  losartan-hydrochlorothiazide (HYZAAR) 100-12.5 MG per tablet TAKE ONE TABLET BY MOUTH ONCE DAILY. 03/24/13  Yes Kathyrn Drown, MD  memantine (NAMENDA) 10 MG tablet Take 10 mg by mouth 2 (two) times daily.   Yes Historical Provider, MD  Multiple Vitamins-Minerals (PRESERVISION/LUTEIN PO) Take 1 capsule by mouth 2 (two) times daily with a meal.   Yes Historical Provider, MD  NAMENDA 10 MG tablet TAKE (1) TABLET BY MOUTH TWICE DAILY. 03/24/13  Yes Kathyrn Drown, MD  risperiDONE (RISPERDAL) 0.5 MG tablet Take 0.5 mg by mouth 2 (two) times daily.    Yes Historical Provider, MD  traZODone (DESYREL) 50 MG tablet Take 25 mg by mouth at bedtime.   Yes Historical Provider, MD  trimethoprim (TRIMPEX) 100 MG tablet Take 1 tablet (100 mg total) by mouth daily. 03/08/15  Yes Sinda Du, MD  acetaminophen (TYLENOL) 500 MG tablet Take 500-1,000 mg by mouth every 6 (six) hours as needed (pain(Max 5 tablets per 24hours)).     Historical Provider, MD  amitriptyline (ELAVIL) 75 MG tablet TAKE (1) TABLET BY MOUTH AT BEDTIME. Patient not taking: Reported on 07/06/2015 06/22/13   Kathyrn Drown, MD  nystatin-triamcinolone (MYCOLOG II) cream Apply 1 application topically 2 (two) times daily as needed (rash).  12/11/12   Historical Provider, MD   Physical Exam: Filed Vitals:   07/06/15 1730 07/06/15 1735 07/06/15 1745 07/06/15 1815  BP: 100/66  95/60 90/54  Pulse:   63 65  Temp:  97.9 F (36.6 C)    TempSrc:  Rectal    Resp:   12 12  Weight:      SpO2:   96% 95%    Wt Readings from Last 3 Encounters:  07/06/15 77.111 kg (170 lb)  05/03/15 77.293 kg (170 lb 6.4 oz)  03/12/15 77.111 kg (170 lb)    General:  Appears calm and comfortable. She is not toxic or septic clinically. Blood pressure is soft but  peripheral pulses are bounding and peripheries are warm. Eyes: PERRL, normal lids, irises & conjunctiva ENT: grossly normal hearing, lips & tongue Neck: no LAD, masses or thyromegaly Cardiovascular: RRR, no m/r/g. No LE edema. Telemetry: SR, no arrhythmias  Respiratory: CTA bilaterally, no w/r/r. Normal respiratory effort. Abdomen: soft, ntnd Skin: no rash or induration seen on limited exam Musculoskeletal: grossly normal tone BUE/BLE Psychiatric: Not examined. Neurologic: grossly non-focal.          Labs on Admission:  Basic Metabolic Panel:  Recent Labs Lab 07/06/15 1426  NA 136  K 3.9  CL 101  CO2 27  GLUCOSE 168*  BUN 16  CREATININE 1.14*  CALCIUM 9.1   Liver Function Tests:  Recent Labs Lab 07/06/15 1426  AST 16  ALT 13*  ALKPHOS 68  BILITOT 0.6  PROT 6.3*  ALBUMIN 3.3*   No results for input(s): LIPASE, AMYLASE in the last 168 hours. No results for input(s): AMMONIA in the last 168 hours. CBC:  Recent Labs Lab 07/06/15 1426  WBC 6.3  NEUTROABS 4.8  HGB 11.6*  HCT 34.3*  MCV 88.6  PLT 156   Cardiac Enzymes:  Recent Labs Lab 07/06/15 1426  TROPONINI <0.03    BNP (last 3 results) No results for input(s): BNP in the last 8760 hours.  ProBNP (last 3 results) No results for input(s): PROBNP in the last 8760 hours.  CBG:  Recent Labs Lab 07/06/15 1422  GLUCAP 155*    Radiological Exams on Admission: Ct Head Wo Contrast  07/06/2015  CLINICAL DATA:  Altered mental status. Shortness of breath and chest pain. Seizure? EXAM: CT HEAD WITHOUT CONTRAST TECHNIQUE: Contiguous axial images were obtained from the base of the skull through the vertex without intravenous contrast. COMPARISON:  Head CT dated 05/09/2015 FINDINGS: Again noted is generalized brain atrophy with commensurate dilatation of the ventricles and sulci. Patchy areas of low density are again seen within the bilateral periventricular and subcortical white matter regions compatible  with changes of chronic small vessel ischemia. 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 There is no mass, hemorrhage, edema, or other evidence of acute parenchymal abnormality. No extra-axial hemorrhage. There are chronic calcified atherosclerotic changes of the large vessels at the skull base. No focal osseous abnormality. Visualized upper paranasal sinuses are clear. Mastoid air cells are clear. Superficial soft tissues are unremarkable. IMPRESSION: 1. Atrophy and chronic ischemic changes as detailed above. 2. No evidence of acute intracranial abnormality. No intracranial mass, hemorrhage, or edema. Electronically Signed   By: Franki Cabot M.D.   On: 07/06/2015 14:59      Assessment/Plan   1. Altered mental status. It is possible that the patient did have a seizure possibly stemming from a small CVA. CT brain scan is unremarkable. I don't think it would be of much value investigating this further with an MRI brain scan at this stage. We will treat her symptomatically. 2. UTI. This could of course be contributing to her altered mental status and we will be treating this with intravenous Rocephin. Urine has been sent for culture. 3. Hypertension. Currently she is somewhat hypotensive. Hold antihypertensive medications and monitor closely. 4. Clinical dehydration. She will be given IV fluids. She may require a bolus of fluids also.  She'll be admitted to the stepdown unit in view of her hypotension. Further recommendations will depend on patient's hospital progress.   Code Status: DO NOT RESUSCITATE. This was confirmed with the daughter-in-law at the bedside.  DVT Prophylaxis: Lovenox.  Family Communication: I discussed the plan with the patient's daughter-in-law at the bedside.   Disposition Plan: Depending on progress.   Time spent: 60 minutes.  Doree Albee [facility] Pager [phone].

## 2015-07-07 ENCOUNTER — Inpatient Hospital Stay (HOSPITAL_COMMUNITY)
Admit: 2015-07-07 | Discharge: 2015-07-07 | Disposition: A | Discharge status: Home / Self Care | Payer: Medicare Other | Attending: Pulmonary Disease | Admitting: Pulmonary Disease

## 2015-07-07 LAB — COMPREHENSIVE METABOLIC PANEL
ALK PHOS: 63 U/L (ref 38–126)
ALT: 12 U/L — AB (ref 14–54)
AST: 13 U/L — ABNORMAL LOW (ref 15–41)
Albumin: 2.8 g/dL — ABNORMAL LOW (ref 3.5–5.0)
Anion gap: 6 (ref 5–15)
BILIRUBIN TOTAL: 0.5 mg/dL (ref 0.3–1.2)
BUN: 14 mg/dL (ref 6–20)
CALCIUM: 8.6 mg/dL — AB (ref 8.9–10.3)
CO2: 26 mmol/L (ref 22–32)
CREATININE: 1.04 mg/dL — AB (ref 0.44–1.00)
Chloride: 106 mmol/L (ref 101–111)
GFR calc Af Amer: 56 mL/min — ABNORMAL LOW (ref >60)
GFR calc non Af Amer: 48 mL/min — ABNORMAL LOW (ref >60)
GLUCOSE: 122 mg/dL — AB (ref 65–99)
Potassium: 3.8 mmol/L (ref 3.5–5.1)
SODIUM: 138 mmol/L (ref 135–145)
Total Protein: 5.6 g/dL — ABNORMAL LOW (ref 6.5–8.1)

## 2015-07-07 LAB — GLUCOSE, CAPILLARY
GLUCOSE-CAPILLARY: 116 mg/dL — AB (ref 65–99)
Glucose-Capillary: 120 mg/dL — ABNORMAL HIGH (ref 65–99)
Glucose-Capillary: 112 mg/dL — ABNORMAL HIGH (ref 65–99)
Glucose-Capillary: 127 mg/dL — ABNORMAL HIGH (ref 65–99)

## 2015-07-07 LAB — URINE CULTURE

## 2015-07-07 LAB — CBC
HCT: 32.1 % — ABNORMAL LOW (ref 36.0–46.0)
Hemoglobin: 10.7 g/dL — ABNORMAL LOW (ref 12.0–15.0)
MCH: 29.8 pg (ref 26.0–34.0)
MCHC: 33.3 g/dL (ref 30.0–36.0)
MCV: 89.4 fL (ref 78.0–100.0)
PLATELETS: 146 10*3/uL — AB (ref 150–400)
RBC: 3.59 MIL/uL — ABNORMAL LOW (ref 3.87–5.11)
RDW: 14 % (ref 11.5–15.5)
WBC: 6 10*3/uL (ref 4.0–10.5)

## 2015-07-07 NOTE — Care Management Note (Signed)
Case Management Note  Patient Details  Name: [name] MRN: [id number] Date of Birth: 1930-05-30  Subjective/Objective:                  Pt admitted with UTI. Pt lives at [facility]. Pt uses a walker and requires assistance with ADL's.   Action/Plan: Anticipate pt will return to [facility] [place] has been referred and will arrange for return to facility at [place]. No CM needs anticipated.   Expected Discharge Date:    07/11/2015              Expected Discharge Plan:  Assisted Living / Rest Home  In-House Referral:  Clinical Social Work  Discharge planning Services  CM Consult  Post Acute Care Choice:  NA Choice offered to:  NA  DME Arranged:    DME Agency:     HH Arranged:    [place] Agency:     Status of Service:  Completed, signed off  Medicare Important Message Given:    Date Medicare IM Given:    Medicare IM give by:    Date Additional Medicare IM Given:    Additional Medicare Important Message give by:     If discussed at [place] of Stay Meetings, dates discussed:    Additional Comments:  Sherald Barge, RN 07/07/2015, 3:03 PM

## 2015-07-07 NOTE — Progress Notes (Signed)
Subjective: She was admitted with another urinary tract infection and some question of a seizure. She is doing better.  Objective: Vital signs in last 24 hours: Temp:  [97.4 F (36.3 C)-98.1 F (36.7 C)] 97.8 F (36.6 C) (12/01 0801) Pulse Rate:  [44-68] 44 (12/01 0500) Resp:  [7-22] 12 (12/01 0500) BP: (86-125)/(52-80) 87/60 mmHg (12/01 0500) SpO2:  [94 %-100 %] 98 % (12/01 0500) Weight:  [73.1 kg (161 lb 2.5 oz)-77.111 kg (170 lb)] 73.1 kg (161 lb 2.5 oz) (12/01 0500) Weight change:  Last BM Date: 07/06/15  Intake/Output from previous day: 11/30 0701 - 12/01 0700 In: -  Out: 600 [Urine:600]  PHYSICAL EXAM General appearance: alert and slowed mentation Resp: clear to auscultation bilaterally Cardio: regular rate and rhythm, S1, S2 normal, no murmur, click, rub or gallop GI: soft, non-tender; bowel sounds normal; no masses,  no organomegaly Extremities: extremities normal, atraumatic, no cyanosis or edema  Lab Results:  Results for orders placed or performed during the hospital encounter of 07/06/15 (from the past 48 hour(s))  Urinalysis, Routine w reflex microscopic     Status: Abnormal   Collection Time: 07/06/15  2:05 PM  Result Value Ref Range   Color, Urine YELLOW YELLOW   APPearance CLEAR CLEAR   Specific Gravity, Urine 1.015 1.005 - 1.030   pH 6.5 5.0 - 8.0   Glucose, UA NEGATIVE NEGATIVE mg/dL   Hgb urine dipstick NEGATIVE NEGATIVE   Bilirubin Urine NEGATIVE NEGATIVE   Ketones, ur NEGATIVE NEGATIVE mg/dL   Protein, ur NEGATIVE NEGATIVE mg/dL   Nitrite NEGATIVE NEGATIVE   Leukocytes, UA LARGE (A) NEGATIVE  Urine microscopic-add on     Status: Abnormal   Collection Time: 07/06/15  2:05 PM  Result Value Ref Range   Squamous Epithelial / LPF 6-30 (A) NONE SEEN   WBC, UA TOO NUMEROUS TO COUNT 0 - 5 WBC/hpf   RBC / HPF 0-5 0 - 5 RBC/hpf   Bacteria, UA FEW (A) NONE SEEN  CBG monitoring, ED     Status: Abnormal   Collection Time: 07/06/15  2:22 PM  Result Value  Ref Range   Glucose-Capillary 155 (H) 65 - 99 mg/dL  CBC with Differential     Status: Abnormal   Collection Time: 07/06/15  2:26 PM  Result Value Ref Range   WBC 6.3 4.0 - 10.5 K/uL   RBC 3.87 3.87 - 5.11 MIL/uL   Hemoglobin 11.6 (L) 12.0 - 15.0 g/dL   HCT 34.3 (L) 36.0 - 46.0 %   MCV 88.6 78.0 - 100.0 fL   MCH 30.0 26.0 - 34.0 pg   MCHC 33.8 30.0 - 36.0 g/dL   RDW 13.9 11.5 - 15.5 %   Platelets 156 150 - 400 K/uL   Neutrophils Relative % 75 %   Neutro Abs 4.8 1.7 - 7.7 K/uL   Lymphocytes Relative 15 %   Lymphs Abs 0.9 0.7 - 4.0 K/uL   Monocytes Relative 8 %   Monocytes Absolute 0.5 0.1 - 1.0 K/uL   Eosinophils Relative 1 %   Eosinophils Absolute 0.0 0.0 - 0.7 K/uL   Basophils Relative 1 %   Basophils Absolute 0.0 0.0 - 0.1 K/uL  Comprehensive metabolic panel     Status: Abnormal   Collection Time: 07/06/15  2:26 PM  Result Value Ref Range   Sodium 136 135 - 145 mmol/L   Potassium 3.9 3.5 - 5.1 mmol/L   Chloride 101 101 - 111 mmol/L   CO2 27 22 -  32 mmol/L   Glucose, Bld 168 (H) 65 - 99 mg/dL   BUN 16 6 - 20 mg/dL   Creatinine, Ser 1.14 (H) 0.44 - 1.00 mg/dL   Calcium 9.1 8.9 - 10.3 mg/dL   Total Protein 6.3 (L) 6.5 - 8.1 g/dL   Albumin 3.3 (L) 3.5 - 5.0 g/dL   AST 16 15 - 41 U/L   ALT 13 (L) 14 - 54 U/L   Alkaline Phosphatase 68 38 - 126 U/L   Total Bilirubin 0.6 0.3 - 1.2 mg/dL   GFR calc non Af Amer 43 (L) >60 mL/min   GFR calc Af Amer 50 (L) >60 mL/min    Comment: (NOTE) The eGFR has been calculated using the CKD EPI equation. This calculation has not been validated in all clinical situations. eGFR's persistently <60 mL/min signify possible Chronic Kidney Disease.    Anion gap 8 5 - 15  Troponin I     Status: None   Collection Time: 07/06/15  2:26 PM  Result Value Ref Range   Troponin I <0.03 <0.031 ng/mL    Comment:        NO INDICATION OF MYOCARDIAL INJURY.   Protime-INR     Status: None   Collection Time: 07/06/15  2:26 PM  Result Value Ref Range    Prothrombin Time 14.9 11.6 - 15.2 seconds   INR 1.15 0.00 - 1.49  I-Stat CG4 Lactic Acid, ED     Status: None   Collection Time: 07/06/15  3:27 PM  Result Value Ref Range   Lactic Acid, Venous 1.27 0.5 - 2.0 mmol/L  Glucose, capillary     Status: Abnormal   Collection Time: 07/06/15  9:25 PM  Result Value Ref Range   Glucose-Capillary 149 (H) 65 - 99 mg/dL   Comment 1 Document in Chart   Comprehensive metabolic panel     Status: Abnormal   Collection Time: 07/07/15  5:01 AM  Result Value Ref Range   Sodium 138 135 - 145 mmol/L   Potassium 3.8 3.5 - 5.1 mmol/L   Chloride 106 101 - 111 mmol/L   CO2 26 22 - 32 mmol/L   Glucose, Bld 122 (H) 65 - 99 mg/dL   BUN 14 6 - 20 mg/dL   Creatinine, Ser 1.04 (H) 0.44 - 1.00 mg/dL   Calcium 8.6 (L) 8.9 - 10.3 mg/dL   Total Protein 5.6 (L) 6.5 - 8.1 g/dL   Albumin 2.8 (L) 3.5 - 5.0 g/dL   AST 13 (L) 15 - 41 U/L   ALT 12 (L) 14 - 54 U/L   Alkaline Phosphatase 63 38 - 126 U/L   Total Bilirubin 0.5 0.3 - 1.2 mg/dL   GFR calc non Af Amer 48 (L) >60 mL/min   GFR calc Af Amer 56 (L) >60 mL/min    Comment: (NOTE) The eGFR has been calculated using the CKD EPI equation. This calculation has not been validated in all clinical situations. eGFR's persistently <60 mL/min signify possible Chronic Kidney Disease.    Anion gap 6 5 - 15  CBC     Status: Abnormal   Collection Time: 07/07/15  5:01 AM  Result Value Ref Range   WBC 6.0 4.0 - 10.5 K/uL   RBC 3.59 (L) 3.87 - 5.11 MIL/uL   Hemoglobin 10.7 (L) 12.0 - 15.0 g/dL   HCT 32.1 (L) 36.0 - 46.0 %   MCV 89.4 78.0 - 100.0 fL   MCH 29.8 26.0 - 34.0 pg   MCHC  33.3 30.0 - 36.0 g/dL   RDW 14.0 11.5 - 15.5 %   Platelets 146 (L) 150 - 400 K/uL  Glucose, capillary     Status: Abnormal   Collection Time: 07/07/15  7:44 AM  Result Value Ref Range   Glucose-Capillary 120 (H) 65 - 99 mg/dL    ABGS No results for input(s): PHART, PO2ART, TCO2, HCO3 in the last 72 hours.  Invalid input(s):  PCO2 CULTURES No results found for this or any previous visit (from the past 240 hour(s)). Studies/Results: Ct Head Wo Contrast  07/06/2015  CLINICAL DATA:  Altered mental status. Shortness of breath and chest pain. Seizure? EXAM: CT HEAD WITHOUT CONTRAST TECHNIQUE: Contiguous axial images were obtained from the base of the skull through the vertex without intravenous contrast. COMPARISON:  Head CT dated 05/09/2015 FINDINGS: Again noted is generalized brain atrophy with commensurate dilatation of the ventricles and sulci. Patchy areas of low density are again seen within the bilateral periventricular and subcortical white matter regions compatible with changes of chronic small vessel ischemia. 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 There is no mass, hemorrhage, edema, or other evidence of acute parenchymal abnormality. No extra-axial hemorrhage. There are chronic calcified atherosclerotic changes of the large vessels at the skull base. No focal osseous abnormality. Visualized upper paranasal sinuses are clear. Mastoid air cells are clear. Superficial soft tissues are unremarkable. IMPRESSION: 1. Atrophy and chronic ischemic changes as detailed above. 2. No evidence of acute intracranial abnormality. No intracranial mass, hemorrhage, or edema. Electronically Signed   By: Franki Cabot M.D.   On: 07/06/2015 14:59    Medications:  Prior to Admission:  Prescriptions prior to admission  Medication Sig Dispense Refill Last Dose  . acidophilus (RISAQUAD) CAPS capsule Take 1 capsule by mouth daily.   07/06/2015 at Unknown time  . allopurinol (ZYLOPRIM) 100 MG tablet TAKE ONE TABLET BY MOUTH ONCE DAILY. 30 tablet 9 07/06/2015 at Unknown time  . atorvastatin (LIPITOR) 20 MG tablet TAKE (1) TABLET BY MOUTH AT BEDTIME. 30 tablet 9 Past Month at Unknown time  . ciprofloxacin (CIPRO) 250 MG tablet Take 250 mg by mouth 2 (two) times daily.   07/04/2015 at Unknown time  .  citalopram (CELEXA) 10 MG tablet TAKE ONE TABLET BY MOUTH ONCE DAILY. 30 tablet 9 Past Month at Unknown time  . diltiazem (CARDIZEM CD) 180 MG 24 hr capsule Take 180 mg by mouth daily.   07/06/2015 at Unknown time  . donepezil (ARICEPT) 10 MG tablet Take 10 mg by mouth at bedtime.   07/05/2015 at Unknown time  . glucose blood (ACCU-CHEK AVIVA) test strip 1 each by Other route as needed for other. Use as instructed   07/06/2015 at Unknown time  . Lancets (ACCU-CHEK SOFT TOUCH) lancets 1 each by Other route as needed for other. Use as instructed   07/06/2015 at Unknown time  . levothyroxine (SYNTHROID, LEVOTHROID) 112 MCG tablet Take 1 tablet (112 mcg total) by mouth daily. 30 tablet 3 07/06/2015 at Unknown time  . linagliptin (TRADJENTA) 5 MG TABS tablet Take 5 mg by mouth daily.   07/06/2015 at Unknown time  . LORazepam (ATIVAN) 2 MG tablet Take 1 mg by mouth daily.    07/05/2015 at Unknown time  . losartan-hydrochlorothiazide (HYZAAR) 100-12.5 MG per tablet TAKE ONE TABLET BY MOUTH ONCE DAILY. 30 tablet 9 07/06/2015 at Unknown time  . memantine (NAMENDA) 10 MG tablet Take 10 mg by mouth 2 (two) times daily.  07/06/2015 at Unknown time  . Multiple Vitamins-Minerals (PRESERVISION/LUTEIN PO) Take 1 capsule by mouth 2 (two) times daily with a meal.   Past Month at Unknown time  . NAMENDA 10 MG tablet TAKE (1) TABLET BY MOUTH TWICE DAILY. 60 tablet 9 07/06/2015 at Unknown time  . risperiDONE (RISPERDAL) 0.5 MG tablet Take 0.5 mg by mouth 2 (two) times daily.    07/06/2015 at Unknown time  . traZODone (DESYREL) 50 MG tablet Take 25 mg by mouth at bedtime.   07/05/2015 at Unknown time  . trimethoprim (TRIMPEX) 100 MG tablet Take 1 tablet (100 mg total) by mouth daily. 30 tablet 12 07/06/2015 at Unknown time  . acetaminophen (TYLENOL) 500 MG tablet Take 500-1,000 mg by mouth every 6 (six) hours as needed (pain(Max 5 tablets per 24hours)).    Not Taking at Unknown time  . amitriptyline (ELAVIL) 75 MG tablet  TAKE (1) TABLET BY MOUTH AT BEDTIME. (Patient not taking: Reported on 07/06/2015) 30 tablet 5 Not Taking at Unknown time  . nystatin-triamcinolone (MYCOLOG II) cream Apply 1 application topically 2 (two) times daily as needed (rash).    Not Taking at Unknown time   Scheduled: . allopurinol  100 mg Oral Daily  . cefTRIAXone (ROCEPHIN)  IV  1 g Intravenous Q24H  . donepezil  10 mg Oral QHS  . enoxaparin (LOVENOX) injection  40 mg Subcutaneous Q24H  . insulin aspart  0-15 Units Subcutaneous TID WC  . insulin aspart  0-5 Units Subcutaneous QHS  . levothyroxine  112 mcg Oral QAC breakfast  . linagliptin  5 mg Oral Daily  . LORazepam  1 mg Oral Daily  . memantine  10 mg Oral BID  . memantine  10 mg Oral BID  . risperiDONE  0.5 mg Oral BID  . sodium chloride  3 mL Intravenous Q12H  . traZODone  25 mg Oral QHS   Continuous: . sodium chloride 125 mL/hr at 07/07/15 0456   [id number]  Assesment: She has altered mental status presumably from UTI. She has been dehydrated and is receiving IV fluids. Her blood pressure is better. At baseline she has severe Alzheimer's disease. Her family has been moving toward palliative care. They do not want palliative care consultation which I offered again Active Problems:   Diabetes [facility])   Essential hypertension   Alzheimer's disease   UTI (lower urinary tract infection)   Dehydration   Altered mental status    Plan: I think she can probably transfer from ICU later today. She needs an EEG    LOS: 1 day   Camiah Humm L 07/07/2015, 8:46 AM

## 2015-07-07 NOTE — Progress Notes (Signed)
EEG Completed; Results Pending  

## 2015-07-07 NOTE — NC FL2 (Deleted)
[place] LEVEL OF CARE SCREENING TOOL     IDENTIFICATION  Patient Name: [name] Birthdate: 1929-10-26 Sex: female Admission Date (Current Location): 07/06/2015  [facility] and [place] Number:     Facility and Address:  [facility] [address] [place]      Provider Number: [id number]  Attending Physician Name and Address:  Sinda Du, MD  Relative Name and Phone Number:       Current Level of Care: Hospital Recommended Level of Care: [place] Prior Approval Number:    Date Approved/Denied:   PASRR Number:    Discharge Plan: Other (Comment) ([facility]isted Living-[facility] )    Current Diagnoses: Patient Active Problem List   Diagnosis Date Noted  . Altered mental status 07/06/2015  . Dementia with behavioral disturbance 05/06/2015  . Palliative care encounter   . Weakness 03/05/2015  . Dehydration 03/05/2015  . Constipation 09/21/2014  . Bacteremia 09/17/2014  . Sepsis ([place]) 09/17/2014  . UTI (lower urinary tract infection) 09/16/2014  . Alzheimer's disease 09/08/2013  . Anemia 01/22/2013  . Atrial fibrillation with RVR ([place]) 11/19/2012  . Recurrent UTI 11/19/2012  . Yeast infection 11/14/2012  . Back pain 11/05/2012  . Dysuria 11/01/2012  . Hemorrhoids, internal 01/02/2012  . Anal or rectal pain 11/28/2011  . VULVOVAGINITIS 02/10/2009  . OTHER SPECIFIED DISORDER OF RECTUM AND ANUS 08/30/2008  . Diabetes ([place]) 03/25/2008  . MIGRAINE HEADACHE 12/25/2007  . Hypothyroidism 10/17/2007  . HYPERLIPIDEMIA 10/17/2007  . ANXIETY 10/17/2007  . DEPRESSION 10/17/2007  . Essential hypertension 10/17/2007  . VARICOSE VEIN 10/17/2007  . ASTHMA 10/17/2007  . OVERACTIVE BLADDER 10/17/2007  . CARDIAC MURMUR 10/17/2007  . NEOPLASM, MALIGNANT, BLADDER, HX OF 10/17/2007  . SKIN CANCER, HX OF 10/17/2007  . CATARACT, HX OF 10/17/2007    Orientation ACTIVITIES/SOCIAL BLADDER RESPIRATION    Self  Family  supportive Continent Normal  BEHAVIORAL SYMPTOMS/MOOD NEUROLOGICAL BOWEL NUTRITION STATUS      Continent Diet (Carb Modified)  PHYSICIAN VISITS COMMUNICATION OF NEEDS Height & Weight Skin    Verbally 5\' 4"  (162.6 cm) 161 lbs. Normal          AMBULATORY STATUS RESPIRATION    Supervision limited Normal      Personal Care Assistance Level of Assistance  Bathing, Feeding, Dressing Bathing Assistance: Limited assistance Feeding assistance: Limited assistance Dressing Assistance: Limited assistance      Functional Limitations Info                SPECIAL CARE FACTORS FREQUENCY                      Additional Factors Info  Psychotropic, Insulin Sliding Scale Code Status Info: DNR Allergies Info: Sulfonamide Derivatives Psychotropic Info:  (Ativan) Insulin Sliding Scale Info:  (3 times daily )       Current Medications (07/07/2015):  This is the current hospital active medication list Current Facility-Administered Medications  Medication Dose Route Frequency Provider Last Rate Last Dose  . 0.9 %  sodium chloride infusion   Intravenous Continuous Doree Albee, MD 125 mL/hr at 07/07/15 0456    . acetaminophen (TYLENOL) tablet 500-1,000 mg  500-1,000 mg Oral Q6H PRN Nimish C Gosrani, MD      . allopurinol (ZYLOPRIM) tablet 100 mg  100 mg Oral Daily Nimish C Anastasio Champion, MD   100 mg at 07/07/15 0916  . cefTRIAXone (ROCEPHIN) 1 g in dextrose 5 % 50 mL IVPB  1 g  Intravenous Q24H Nimish C Anastasio Champion, MD      . donepezil (ARICEPT) tablet 10 mg  10 mg Oral QHS Doree Albee, MD   10 mg at 07/06/15 2100  . enoxaparin (LOVENOX) injection 40 mg  40 mg Subcutaneous Q24H Nimish C Anastasio Champion, MD   40 mg at 07/06/15 2100  . insulin aspart (novoLOG) injection 0-15 Units  0-15 Units Subcutaneous TID WC Nimish Luther Parody, MD   0 Units at 07/07/15 0800  . insulin aspart (novoLOG) injection 0-5 Units  0-5 Units Subcutaneous QHS Doree Albee, MD   0 Units at 07/06/15 2200  . levothyroxine  (SYNTHROID, LEVOTHROID) tablet 112 mcg  112 mcg Oral QAC breakfast Doree Albee, MD   112 mcg at 07/07/15 0837  . linagliptin (TRADJENTA) tablet 5 mg  5 mg Oral Daily Nimish Luther Parody, MD   5 mg at 07/07/15 0916  . LORazepam (ATIVAN) tablet 1 mg  1 mg Oral Daily Nimish Luther Parody, MD   1 mg at 07/07/15 0916  . memantine (NAMENDA) tablet 10 mg  10 mg Oral BID Doree Albee, MD   10 mg at 07/07/15 0916  . memantine (NAMENDA) tablet 10 mg  10 mg Oral BID Doree Albee, MD   10 mg at 07/06/15 2059  . risperiDONE (RISPERDAL) tablet 0.5 mg  0.5 mg Oral BID Doree Albee, MD   0.5 mg at 07/07/15 0916  . sodium chloride 0.9 % injection 3 mL  3 mL Intravenous Q12H Nimish C Anastasio Champion, MD   3 mL at 07/06/15 2100  . traZODone (DESYREL) tablet 25 mg  25 mg Oral QHS Doree Albee, MD   25 mg at 07/06/15 2100     Discharge Medications: Please see discharge summary for a list of discharge medications.  Relevant Imaging Results:  Relevant Lab Results:  Recent Labs    Additional Information    Ayren Zumbro, Clydene Pugh, LCSW

## 2015-07-07 NOTE — Clinical Social Work Note (Signed)
Clinical Social Work Assessment  Patient Details  Name: [name] MRN: [id number] Date of Birth: August 10, 1929  Date of referral:  07/07/15               Reason for consult:  Facility Placement                Permission sought to share information with:    Permission granted to share information::     Name::        Agency::     Relationship::     Contact Information:     Housing/Transportation Living arrangements for the past 2 months:  [place] of Information:  Facility, Other (Comment Required) Raiford Noble, Daughter In [place]) Patient Interpreter Needed:  None Criminal Activity/Legal Involvement Pertinent to Current Situation/Hospitalization:  No - Comment as needed Significant Relationships:  Adult Children, Other Family Members Lives with:  Facility Resident Do you feel safe going back to the place where you live?  Yes Need for family participation in patient care:  Yes (Comment)  Care giving concerns: None identified.    Social Worker assessment / plan:  CSW spoke with patient's daughter-in-law, Caneisha Buckholz, who was at bedside.  Ms. Pupo advised that patient has been a resident at [facility] since March. She indicated that patient's granddaughter is the owner of the facility.  Ms. Pietila stated that prior to patient going to [facility] she was a resident at [facility] for two years.  She stated that patient moved facilities due to [employer] closing down.  She advised that patient uses a walker and requires assistance with ambulating.  She stated that patient has alarms when in the bed and sitting to prevent injury. Ms. Jeong advised that patient's POA is her daughter, Liz Malady.  She stated that staff assists patient's with all ADLs.  She stated that the family desired for patient to return to [facility] at discharge.  CSW spoke with Fabio Pierce, with [facility]. Mrs. [facility] confirmed Ms. Music statements. She advised that patient  can return to the facility at discharge.      Employment status:  Retired Nurse, adult PT Recommendations:  Not assessed at this time Information / Referral to community resources:     Patient/Family's Response to care:  Patient's family are all agreeable for patient to return to the facility at discharge.   Patient/Family's Understanding of and Emotional Response to Diagnosis, Current Treatment, and Prognosis: Patient's family has understanding of diagnosis, treatment and prognosis.   Emotional Assessment Appearance:  Appears stated age Attitude/Demeanor/Rapport:  Unable to Assess Affect (typically observed):  Unable to Assess Orientation:    Alcohol / Substance use:  Not Applicable Psych involvement (Current and /or in the community):  No (Comment)  Discharge Needs  Concerns to be addressed:  Discharge Planning Concerns Readmission within the last 30 days:  No Current discharge risk:  None Barriers to Discharge:  No Barriers Identified   Ihor Gully, LCSW 07/07/2015, 11:43 AM [phone]

## 2015-07-08 LAB — GLUCOSE, CAPILLARY: GLUCOSE-CAPILLARY: 104 mg/dL — AB (ref 65–99)

## 2015-07-08 MED ORDER — CEFUROXIME AXETIL 250 MG PO TABS: 250.0000 mg | ORAL_TABLET | Freq: Two times a day (BID) | ORAL | Status: AC

## 2015-07-08 NOTE — Care Management Note (Signed)
Case Management Note  Patient Details  Name: Elianis Mattas MRN: [id number] Date of Birth: 06-10-1930  Pt discharging back to ALF. CSW has arrange for pt to return to facility. No CM needs.   Expected Discharge Date:      07/08/2015            Expected Discharge Plan:  Assisted Living / Rest Home  In-House Referral:  Clinical Social Work  Discharge planning Services  CM Consult  Post Acute Care Choice:  NA Choice offered to:  NA  DME Arranged:    DME Agency:     HH Arranged:    [place] Agency:     Status of Service:  Completed, signed off  Medicare Important Message Given:  N/A - LOS <3 / Initial given by admissions Date Medicare IM Given:    Medicare IM give by:    Date Additional Medicare IM Given:    Additional Medicare Important Message give by:     If discussed at [place] of Stay Meetings, dates discussed:    Additional Comments:  Sherald Barge, RN 07/08/2015, 9:01 AM

## 2015-07-08 NOTE — Progress Notes (Signed)
Subjective: She is overall about the same. She's had some bradycardia through the night that I don't think is going to need any treatment and is asymptomatic. Her oxygen saturation dropped to 88% while she was asleep. Her urine culture shows multiple species so it's not much help as far as trying to decide what antibiotics she should be on. However she is responding to intravenous Rocephin  Objective: Vital signs in last 24 hours: Temp:  [97.4 F (36.3 C)-98.7 F (37.1 C)] 98.7 F (37.1 C) (12/02 0500) Pulse Rate:  [43-73] 43 (12/02 0500) Resp:  [0-27] 0 (12/02 0500) BP: (97-138)/(56-71) 116/62 mmHg (12/02 0300) SpO2:  [88 %-100 %] 99 % (12/02 0500) Weight:  [76 kg (167 lb 8.8 oz)] 76 kg (167 lb 8.8 oz) (12/02 0500) Weight change: -1.112 kg (-2 lb 7.2 oz) Last BM Date: 07/06/15  Intake/Output from previous day: 12/01 0701 - 12/02 0700 In: -  Out: 300 [Urine:300]  PHYSICAL EXAM General appearance: Sleepy confused Resp: clear to auscultation bilaterally Cardio: regular rate and rhythm, S1, S2 normal, no murmur, click, rub or gallop GI: soft, non-tender; bowel sounds normal; no masses,  no organomegaly Extremities: extremities normal, atraumatic, no cyanosis or edema  Lab Results:  Results for orders placed or performed during the hospital encounter of 07/06/15 (from the past 48 hour(s))  Urinalysis, Routine w reflex microscopic     Status: Abnormal   Collection Time: 07/06/15  2:05 PM  Result Value Ref Range   Color, Urine YELLOW YELLOW   APPearance CLEAR CLEAR   Specific Gravity, Urine 1.015 1.005 - 1.030   pH 6.5 5.0 - 8.0   Glucose, UA NEGATIVE NEGATIVE mg/dL   Hgb urine dipstick NEGATIVE NEGATIVE   Bilirubin Urine NEGATIVE NEGATIVE   Ketones, ur NEGATIVE NEGATIVE mg/dL   Protein, ur NEGATIVE NEGATIVE mg/dL   Nitrite NEGATIVE NEGATIVE   Leukocytes, UA LARGE (A) NEGATIVE  Urine microscopic-add on     Status: Abnormal   Collection Time: 07/06/15  2:05 PM  Result Value  Ref Range   Squamous Epithelial / LPF 6-30 (A) NONE SEEN   WBC, UA TOO NUMEROUS TO COUNT 0 - 5 WBC/hpf   RBC / HPF 0-5 0 - 5 RBC/hpf   Bacteria, UA FEW (A) NONE SEEN  Urine culture     Status: None   Collection Time: 07/06/15  2:05 PM  Result Value Ref Range   Specimen Description URINE, CLEAN CATCH    Special Requests NONE    Culture      MULTIPLE SPECIES PRESENT, SUGGEST RECOLLECTION Performed at [facility]    Report Status 07/07/2015 FINAL   CBG monitoring, ED     Status: Abnormal   Collection Time: 07/06/15  2:22 PM  Result Value Ref Range   Glucose-Capillary 155 (H) 65 - 99 mg/dL  CBC with Differential     Status: Abnormal   Collection Time: 07/06/15  2:26 PM  Result Value Ref Range   WBC 6.3 4.0 - 10.5 K/uL   RBC 3.87 3.87 - 5.11 MIL/uL   Hemoglobin 11.6 (L) 12.0 - 15.0 g/dL   HCT 34.3 (L) 36.0 - 46.0 %   MCV 88.6 78.0 - 100.0 fL   MCH 30.0 26.0 - 34.0 pg   MCHC 33.8 30.0 - 36.0 g/dL   RDW 13.9 11.5 - 15.5 %   Platelets 156 150 - 400 K/uL   Neutrophils Relative % 75 %   Neutro Abs 4.8 1.7 - 7.7 K/uL   Lymphocytes  Relative 15 %   Lymphs Abs 0.9 0.7 - 4.0 K/uL   Monocytes Relative 8 %   Monocytes Absolute 0.5 0.1 - 1.0 K/uL   Eosinophils Relative 1 %   Eosinophils Absolute 0.0 0.0 - 0.7 K/uL   Basophils Relative 1 %   Basophils Absolute 0.0 0.0 - 0.1 K/uL  Comprehensive metabolic panel     Status: Abnormal   Collection Time: 07/06/15  2:26 PM  Result Value Ref Range   Sodium 136 135 - 145 mmol/L   Potassium 3.9 3.5 - 5.1 mmol/L   Chloride 101 101 - 111 mmol/L   CO2 27 22 - 32 mmol/L   Glucose, Bld 168 (H) 65 - 99 mg/dL   BUN 16 6 - 20 mg/dL   Creatinine, Ser 1.14 (H) 0.44 - 1.00 mg/dL   Calcium 9.1 8.9 - 10.3 mg/dL   Total Protein 6.3 (L) 6.5 - 8.1 g/dL   Albumin 3.3 (L) 3.5 - 5.0 g/dL   AST 16 15 - 41 U/L   ALT 13 (L) 14 - 54 U/L   Alkaline Phosphatase 68 38 - 126 U/L   Total Bilirubin 0.6 0.3 - 1.2 mg/dL   GFR calc non Af Amer 43 (L) >60 mL/min    GFR calc Af Amer 50 (L) >60 mL/min    Comment: (NOTE) The eGFR has been calculated using the CKD EPI equation. This calculation has not been validated in all clinical situations. eGFR's persistently <60 mL/min signify possible Chronic Kidney Disease.    Anion gap 8 5 - 15  Troponin I     Status: None   Collection Time: 07/06/15  2:26 PM  Result Value Ref Range   Troponin I <0.03 <0.031 ng/mL    Comment:        NO INDICATION OF MYOCARDIAL INJURY.   Protime-INR     Status: None   Collection Time: 07/06/15  2:26 PM  Result Value Ref Range   Prothrombin Time 14.9 11.6 - 15.2 seconds   INR 1.15 0.00 - 1.49  I-Stat CG4 Lactic Acid, ED     Status: None   Collection Time: 07/06/15  3:27 PM  Result Value Ref Range   Lactic Acid, Venous 1.27 0.5 - 2.0 mmol/L  Glucose, capillary     Status: Abnormal   Collection Time: 07/06/15  9:25 PM  Result Value Ref Range   Glucose-Capillary 149 (H) 65 - 99 mg/dL   Comment 1 Document in Chart   Comprehensive metabolic panel     Status: Abnormal   Collection Time: 07/07/15  5:01 AM  Result Value Ref Range   Sodium 138 135 - 145 mmol/L   Potassium 3.8 3.5 - 5.1 mmol/L   Chloride 106 101 - 111 mmol/L   CO2 26 22 - 32 mmol/L   Glucose, Bld 122 (H) 65 - 99 mg/dL   BUN 14 6 - 20 mg/dL   Creatinine, Ser 1.04 (H) 0.44 - 1.00 mg/dL   Calcium 8.6 (L) 8.9 - 10.3 mg/dL   Total Protein 5.6 (L) 6.5 - 8.1 g/dL   Albumin 2.8 (L) 3.5 - 5.0 g/dL   AST 13 (L) 15 - 41 U/L   ALT 12 (L) 14 - 54 U/L   Alkaline Phosphatase 63 38 - 126 U/L   Total Bilirubin 0.5 0.3 - 1.2 mg/dL   GFR calc non Af Amer 48 (L) >60 mL/min   GFR calc Af Amer 56 (L) >60 mL/min    Comment: (NOTE) The eGFR has  been calculated using the CKD EPI equation. This calculation has not been validated in all clinical situations. eGFR's persistently <60 mL/min signify possible Chronic Kidney Disease.    Anion gap 6 5 - 15  CBC     Status: Abnormal   Collection Time: 07/07/15  5:01 AM   Result Value Ref Range   WBC 6.0 4.0 - 10.5 K/uL   RBC 3.59 (L) 3.87 - 5.11 MIL/uL   Hemoglobin 10.7 (L) 12.0 - 15.0 g/dL   HCT 32.1 (L) 36.0 - 46.0 %   MCV 89.4 78.0 - 100.0 fL   MCH 29.8 26.0 - 34.0 pg   MCHC 33.3 30.0 - 36.0 g/dL   RDW 14.0 11.5 - 15.5 %   Platelets 146 (L) 150 - 400 K/uL  Glucose, capillary     Status: Abnormal   Collection Time: 07/07/15  7:44 AM  Result Value Ref Range   Glucose-Capillary 120 (H) 65 - 99 mg/dL  Glucose, capillary     Status: Abnormal   Collection Time: 07/07/15 11:48 AM  Result Value Ref Range   Glucose-Capillary 116 (H) 65 - 99 mg/dL   Comment 1 Notify RN    Comment 2 Document in Chart   Glucose, capillary     Status: Abnormal   Collection Time: 07/07/15  5:12 PM  Result Value Ref Range   Glucose-Capillary 112 (H) 65 - 99 mg/dL   Comment 1 Notify RN    Comment 2 Document in Chart   Glucose, capillary     Status: Abnormal   Collection Time: 07/07/15  7:52 PM  Result Value Ref Range   Glucose-Capillary 127 (H) 65 - 99 mg/dL   Comment 1 Notify RN    Comment 2 Document in Chart     ABGS No results for input(s): PHART, PO2ART, TCO2, HCO3 in the last 72 hours.  Invalid input(s): PCO2 CULTURES Recent Results (from the past 240 hour(s))  Urine culture     Status: None   Collection Time: 07/06/15  2:05 PM  Result Value Ref Range Status   Specimen Description URINE, CLEAN CATCH  Final   Special Requests NONE  Final   Culture   Final    MULTIPLE SPECIES PRESENT, SUGGEST RECOLLECTION Performed at [facility]    Report Status 07/07/2015 FINAL  Final   Studies/Results: Ct Head Wo Contrast  07/06/2015  CLINICAL DATA:  Altered mental status. Shortness of breath and chest pain. Seizure? EXAM: CT HEAD WITHOUT CONTRAST TECHNIQUE: Contiguous axial images were obtained from the base of the skull through the vertex without intravenous contrast. COMPARISON:  Head CT dated 05/09/2015 FINDINGS: Again noted is generalized brain atrophy  with commensurate dilatation of the ventricles and sulci. Patchy areas of low density are again seen within the bilateral periventricular and subcortical white matter regions compatible with changes of chronic small vessel ischemia. 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 There is no mass, hemorrhage, edema, or other evidence of acute parenchymal abnormality. No extra-axial hemorrhage. There are chronic calcified atherosclerotic changes of the large vessels at the skull base. No focal osseous abnormality. Visualized upper paranasal sinuses are clear. Mastoid air cells are clear. Superficial soft tissues are unremarkable. IMPRESSION: 1. Atrophy and chronic ischemic changes as detailed above. 2. No evidence of acute intracranial abnormality. No intracranial mass, hemorrhage, or edema. Electronically Signed   By: Franki Cabot M.D.   On: 07/06/2015 14:59    Medications:  Prior to Admission:  Prescriptions prior to  admission  Medication Sig Dispense Refill Last Dose  . acidophilus (RISAQUAD) CAPS capsule Take 1 capsule by mouth daily.   07/06/2015 at Unknown time  . allopurinol (ZYLOPRIM) 100 MG tablet TAKE ONE TABLET BY MOUTH ONCE DAILY. 30 tablet 9 07/06/2015 at Unknown time  . atorvastatin (LIPITOR) 20 MG tablet TAKE (1) TABLET BY MOUTH AT BEDTIME. 30 tablet 9 Past Month at Unknown time  . ciprofloxacin (CIPRO) 250 MG tablet Take 250 mg by mouth 2 (two) times daily.   07/04/2015 at Unknown time  . citalopram (CELEXA) 10 MG tablet TAKE ONE TABLET BY MOUTH ONCE DAILY. 30 tablet 9 Past Month at Unknown time  . diltiazem (CARDIZEM CD) 180 MG 24 hr capsule Take 180 mg by mouth daily.   07/06/2015 at Unknown time  . donepezil (ARICEPT) 10 MG tablet Take 10 mg by mouth at bedtime.   07/05/2015 at Unknown time  . glucose blood (ACCU-CHEK AVIVA) test strip 1 each by Other route as needed for other. Use as instructed   07/06/2015 at Unknown time  . Lancets (ACCU-CHEK SOFT TOUCH)  lancets 1 each by Other route as needed for other. Use as instructed   07/06/2015 at Unknown time  . levothyroxine (SYNTHROID, LEVOTHROID) 112 MCG tablet Take 1 tablet (112 mcg total) by mouth daily. 30 tablet 3 07/06/2015 at Unknown time  . linagliptin (TRADJENTA) 5 MG TABS tablet Take 5 mg by mouth daily.   07/06/2015 at Unknown time  . LORazepam (ATIVAN) 2 MG tablet Take 1 mg by mouth daily.    07/05/2015 at Unknown time  . losartan-hydrochlorothiazide (HYZAAR) 100-12.5 MG per tablet TAKE ONE TABLET BY MOUTH ONCE DAILY. 30 tablet 9 07/06/2015 at Unknown time  . memantine (NAMENDA) 10 MG tablet Take 10 mg by mouth 2 (two) times daily.   07/06/2015 at Unknown time  . Multiple Vitamins-Minerals (PRESERVISION/LUTEIN PO) Take 1 capsule by mouth 2 (two) times daily with a meal.   Past Month at Unknown time  . NAMENDA 10 MG tablet TAKE (1) TABLET BY MOUTH TWICE DAILY. 60 tablet 9 07/06/2015 at Unknown time  . risperiDONE (RISPERDAL) 0.5 MG tablet Take 0.5 mg by mouth 2 (two) times daily.    07/06/2015 at Unknown time  . traZODone (DESYREL) 50 MG tablet Take 25 mg by mouth at bedtime.   07/05/2015 at Unknown time  . trimethoprim (TRIMPEX) 100 MG tablet Take 1 tablet (100 mg total) by mouth daily. 30 tablet 12 07/06/2015 at Unknown time  . acetaminophen (TYLENOL) 500 MG tablet Take 500-1,000 mg by mouth every 6 (six) hours as needed (pain(Max 5 tablets per 24hours)).    Not Taking at Unknown time  . amitriptyline (ELAVIL) 75 MG tablet TAKE (1) TABLET BY MOUTH AT BEDTIME. (Patient not taking: Reported on 07/06/2015) 30 tablet 5 Not Taking at Unknown time  . nystatin-triamcinolone (MYCOLOG II) cream Apply 1 application topically 2 (two) times daily as needed (rash).    Not Taking at Unknown time   Scheduled: . allopurinol  100 mg Oral Daily  . cefTRIAXone (ROCEPHIN)  IV  1 g Intravenous Q24H  . donepezil  10 mg Oral QHS  . enoxaparin (LOVENOX) injection  40 mg Subcutaneous Q24H  . insulin aspart  0-15  Units Subcutaneous TID WC  . insulin aspart  0-5 Units Subcutaneous QHS  . levothyroxine  112 mcg Oral QAC breakfast  . linagliptin  5 mg Oral Daily  . LORazepam  1 mg Oral Daily  . memantine  10 mg  Oral BID  . risperiDONE  0.5 mg Oral BID  . sodium chloride  3 mL Intravenous Q12H  . traZODone  25 mg Oral QHS   Continuous: . sodium chloride 125 mL/hr at 07/08/15 8850   YDX:[id number]  Assesment: She was admitted with altered mental status and urinary tract infection. There was some concern about a seizure and she had an EEG that I don't have the report on yet. She has not had any further episodes. Her urine is growing multiple organisms so that's not much help. However she has responded to Rocephin so she should be able to be treated as an outpatient with oral cephalosporins. I discussed her situation with her granddaughter at bedside and she has discussed with her mother and plans are to send her back to assisted living facility today Active Problems:   Diabetes [facility])   Essential hypertension   Alzheimer's disease   UTI (lower urinary tract infection)   Dehydration   Altered mental status    Plan: Discharge today back to assisted living facility she may need treatment if her EEG shows a seizure focus.    LOS: 2 days   Jasalyn Frysinger L 07/08/2015, 7:19 AM

## 2015-07-08 NOTE — Discharge Summary (Signed)
Physician Discharge Summary  Patient ID: [name] MRN: [id number] DOB/AGE: November 20, 1929 81 y.o. Primary Care Physician:Gabreille Dardis L, MD Admit date: 07/06/2015 Discharge date: 07/08/2015    Discharge Diagnoses:   Active Problems:   Diabetes ([place])   Essential hypertension   Alzheimer's disease   UTI (lower urinary tract infection)   Dehydration   Altered mental status     Medication List    STOP taking these medications        atorvastatin 20 MG tablet  Commonly known as:  LIPITOR     ciprofloxacin 250 MG tablet  Commonly known as:  CIPRO     citalopram 10 MG tablet  Commonly known as:  CELEXA      TAKE these medications        ACCU-CHEK AVIVA test strip  Generic drug:  glucose blood  1 each by Other route as needed for other. Use as instructed     accu-chek soft touch lancets  1 each by Other route as needed for other. Use as instructed     acetaminophen 500 MG tablet  Commonly known as:  TYLENOL  Take 500-1,000 mg by mouth every 6 (six) hours as needed (pain(Max 5 tablets per 24hours)).     acidophilus Caps capsule  Take 1 capsule by mouth daily.     allopurinol 100 MG tablet  Commonly known as:  ZYLOPRIM  TAKE ONE TABLET BY MOUTH ONCE DAILY.     amitriptyline 75 MG tablet  Commonly known as:  ELAVIL  TAKE (1) TABLET BY MOUTH AT BEDTIME.     cefUROXime 250 MG tablet  Commonly known as:  CEFTIN  Take 1 tablet (250 mg total) by mouth 2 (two) times daily with a meal.     diltiazem 180 MG 24 hr capsule  Commonly known as:  CARDIZEM CD  Take 180 mg by mouth daily.     donepezil 10 MG tablet  Commonly known as:  ARICEPT  Take 10 mg by mouth at bedtime.     levothyroxine 112 MCG tablet  Commonly known as:  SYNTHROID, LEVOTHROID  Take 1 tablet (112 mcg total) by mouth daily.     linagliptin 5 MG Tabs tablet  Commonly known as:  TRADJENTA  Take 5 mg by mouth daily.     LORazepam 2 MG tablet  Commonly known as:  ATIVAN  Take 1 mg by mouth  daily.     losartan-hydrochlorothiazide 100-12.5 MG tablet  Commonly known as:  HYZAAR  TAKE ONE TABLET BY MOUTH ONCE DAILY.     NAMENDA 10 MG tablet  Generic drug:  memantine  TAKE (1) TABLET BY MOUTH TWICE DAILY.     nystatin-triamcinolone cream  Commonly known as:  MYCOLOG II  Apply 1 application topically 2 (two) times daily as needed (rash).     PRESERVISION/LUTEIN PO  Take 1 capsule by mouth 2 (two) times daily with a meal.     risperiDONE 0.5 MG tablet  Commonly known as:  RISPERDAL  Take 0.5 mg by mouth 2 (two) times daily.     traZODone 50 MG tablet  Commonly known as:  DESYREL  Take 25 mg by mouth at bedtime.     trimethoprim 100 MG tablet  Commonly known as:  TRIMPEX  Take 1 tablet (100 mg total) by mouth daily.        Discharged Condition: Improved    Consults: None  Significant Diagnostic Studies: Ct Head Wo Contrast  07/06/2015  CLINICAL DATA:  Altered mental  status. Shortness of breath and chest pain. Seizure? EXAM: CT HEAD WITHOUT CONTRAST TECHNIQUE: Contiguous axial images were obtained from the base of the skull through the vertex without intravenous contrast. COMPARISON:  Head CT dated 05/09/2015 FINDINGS: Again noted is generalized brain atrophy with commensurate dilatation of the ventricles and sulci. Patchy areas of low density are again seen within the bilateral periventricular and subcortical white matter regions compatible with changes of chronic small vessel ischemia. 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 There is no mass, hemorrhage, edema, or other evidence of acute parenchymal abnormality. No extra-axial hemorrhage. There are chronic calcified atherosclerotic changes of the large vessels at the skull base. No focal osseous abnormality. Visualized upper paranasal sinuses are clear. Mastoid air cells are clear. Superficial soft tissues are unremarkable. IMPRESSION: 1. Atrophy and chronic ischemic changes as  detailed above. 2. No evidence of acute intracranial abnormality. No intracranial mass, hemorrhage, or edema. Electronically Signed   By: Franki Cabot M.D.   On: 07/06/2015 14:59    Lab Results: Basic Metabolic Panel:  Recent Labs  07/06/15 1426 07/07/15 0501  NA 136 138  K 3.9 3.8  CL 101 106  CO2 27 26  GLUCOSE 168* 122*  BUN 16 14  CREATININE 1.14* 1.04*  CALCIUM 9.1 8.6*   Liver Function Tests:  Recent Labs  07/06/15 1426 07/07/15 0501  AST 16 13*  ALT 13* 12*  ALKPHOS 68 63  BILITOT 0.6 0.5  PROT 6.3* 5.6*  ALBUMIN 3.3* 2.8*     CBC:  Recent Labs  07/06/15 1426 07/07/15 0501  WBC 6.3 6.0  NEUTROABS 4.8  --   HGB 11.6* 10.7*  HCT 34.3* 32.1*  MCV 88.6 89.4  PLT 156 146*    Recent Results (from the past 240 hour(s))  Urine culture     Status: None   Collection Time: 07/06/15  2:05 PM  Result Value Ref Range Status   Specimen Description URINE, CLEAN CATCH  Final   Special Requests NONE  Final   Culture   Final    MULTIPLE SPECIES PRESENT, SUGGEST RECOLLECTION Performed at [facility]    Report Status 07/07/2015 FINAL  Final     Hospital Course: This is an 81 year old who had altered mental status and was brought to the emergency department. She has severe Alzheimer's disease at baseline and lives at an assisted living facility. She has recently started treatment for a urinary tract infection. Some of her medications been discontinued. She appeared to be dehydrated and she was mildly hypotensive but did not appear to be septic on admission. She was given IV fluids and started on IV Rocephin and improved. Urine culture grew multiple organisms. There was some question of a seizure so she had an EEG that is pending but she's had no seizure activity since she's been in the hospital. At the time of discharge she is back at her baseline.  Discharge Exam: Blood pressure 116/62, pulse 43, temperature 98.7 F (37.1 C), temperature source Oral, resp.  rate 0, weight 76 kg (167 lb 8.8 oz), SpO2 99 %. She is confused. Her chest is clear. Her heart is regular. Blood pressure now in the [id number] systolic  Disposition: Return to her assisted living facility. She will be on Ceftin 250 mg twice a day 7 days      Discharge Instructions    Discharge patient    Complete by:  As directed  Signed: Stanislav Gervase L   07/08/2015, 7:25 AM

## 2015-07-08 NOTE — Procedures (Signed)
Cindy A. Merlene Laughter, MD     [email or web address]           HISTORY: The patient is an 81 year old who presents with altered mental status and confusion along with shaking suspicious for seizure activity.  MEDICATIONS: Scheduled Meds: Continuous Infusions: PRN Meds:.  Prior to Admission medications   Medication Sig Start Date End Date Taking? Authorizing Provider  acetaminophen (TYLENOL) 500 MG tablet Take 500-1,000 mg by mouth every 6 (six) hours as needed (pain(Max 5 tablets per 24hours)).     Historical Provider, MD  acidophilus (RISAQUAD) CAPS capsule Take 1 capsule by mouth daily.    Historical Provider, MD  allopurinol (ZYLOPRIM) 100 MG tablet TAKE ONE TABLET BY MOUTH ONCE DAILY. 03/24/13   Kathyrn Drown, MD  amitriptyline (ELAVIL) 75 MG tablet TAKE (1) TABLET BY MOUTH AT BEDTIME. Patient not taking: Reported on 07/06/2015 06/22/13   Kathyrn Drown, MD  atorvastatin (LIPITOR) 20 MG tablet TAKE (1) TABLET BY MOUTH AT BEDTIME. 03/24/13   Kathyrn Drown, MD  cefUROXime (CEFTIN) 250 MG tablet Take 1 tablet (250 mg total) by mouth 2 (two) times daily with a meal. 07/08/15   Sinda Du, MD  ciprofloxacin (CIPRO) 250 MG tablet Take 250 mg by mouth 2 (two) times daily. 06/27/15   Historical Provider, MD  citalopram (CELEXA) 10 MG tablet TAKE ONE TABLET BY MOUTH ONCE DAILY. 03/24/13   Kathyrn Drown, MD  diltiazem (CARDIZEM CD) 180 MG 24 hr capsule Take 180 mg by mouth daily. 04/29/15   Historical Provider, MD  donepezil (ARICEPT) 10 MG tablet Take 10 mg by mouth at bedtime.    Historical Provider, MD  glucose blood (ACCU-CHEK AVIVA) test strip 1 each by Other route as needed for other. Use as instructed    Historical Provider, MD  Lancets (ACCU-CHEK SOFT TOUCH) lancets 1 each by Other route as needed for other. Use as instructed    Historical Provider, MD  levothyroxine (SYNTHROID, LEVOTHROID) 112 MCG tablet Take 1 tablet (112 mcg total) by mouth daily. 11/03/13    Kathyrn Drown, MD  linagliptin (TRADJENTA) 5 MG TABS tablet Take 5 mg by mouth daily.    Historical Provider, MD  LORazepam (ATIVAN) 2 MG tablet Take 1 mg by mouth daily.     Historical Provider, MD  losartan-hydrochlorothiazide (HYZAAR) 100-12.5 MG per tablet TAKE ONE TABLET BY MOUTH ONCE DAILY. 03/24/13   Kathyrn Drown, MD  memantine (NAMENDA) 10 MG tablet Take 10 mg by mouth 2 (two) times daily.    Historical Provider, MD  Multiple Vitamins-Minerals (PRESERVISION/LUTEIN PO) Take 1 capsule by mouth 2 (two) times daily with a meal.    Historical Provider, MD  NAMENDA 10 MG tablet TAKE (1) TABLET BY MOUTH TWICE DAILY. 03/24/13   Kathyrn Drown, MD  nystatin-triamcinolone (MYCOLOG II) cream Apply 1 application topically 2 (two) times daily as needed (rash).  12/11/12   Historical Provider, MD  risperiDONE (RISPERDAL) 0.5 MG tablet Take 0.5 mg by mouth 2 (two) times daily.     Historical Provider, MD  traZODone (DESYREL) 50 MG tablet Take 25 mg by mouth at bedtime.    Historical Provider, MD  trimethoprim (TRIMPEX) 100 MG tablet Take 1 tablet (100 mg total) by mouth daily. 03/08/15   Sinda Du, MD      ANALYSIS: A 16 channel recording using standard 10 20 measurements is conducted for 21 minutes. There is a posterior dominant rhythm of 7 Hz which attenuates with eye opening.  There is beta activity observed in frontal areas. There is theta delta slowing noted from time to time. There is no focal or lateralized slowing. Photic stimulation and hyperventilation are not carried out. There is no epileptiform activity is observed.   IMPRESSION: This recording shows mild global slowing indicating a mild global encephalopathy. However, there is no epileptiform activity observed.      Mahogani Holohan A. Merlene Moyer, M.D.  Diplomate, Tax adviser of Psychiatry and Neurology ( Neurology).

## 2015-07-08 NOTE — NC FL2 (Signed)
[place] LEVEL OF CARE SCREENING TOOL     IDENTIFICATION  Patient Name: [name] Birthdate: October 04, 1929 Sex: female Admission Date (Current Location): 07/06/2015  [facility] and [place] Number:     Facility and Address:  [facility] [address] [place]      Provider Number: [id number]  Attending Physician Name and Address:  Sinda Du, MD  Relative Name and Phone Number:       Current Level of Care: Hospital Recommended Level of Care: [place] Prior Approval Number:    Date Approved/Denied:   PASRR Number:    Discharge Plan: Other (Comment) ([facility] )    Current Diagnoses: Patient Active Problem List   Diagnosis Date Noted  . Altered mental status 07/06/2015  . Dementia with behavioral disturbance 05/06/2015  . Palliative care encounter   . Weakness 03/05/2015  . Dehydration 03/05/2015  . Constipation 09/21/2014  . Bacteremia 09/17/2014  . Sepsis ([place]) 09/17/2014  . UTI (lower urinary tract infection) 09/16/2014  . Alzheimer's disease 09/08/2013  . Anemia 01/22/2013  . Atrial fibrillation with RVR ([place]) 11/19/2012  . Recurrent UTI 11/19/2012  . Yeast infection 11/14/2012  . Back pain 11/05/2012  . Dysuria 11/01/2012  . Hemorrhoids, internal 01/02/2012  . Anal or rectal pain 11/28/2011  . VULVOVAGINITIS 02/10/2009  . OTHER SPECIFIED DISORDER OF RECTUM AND ANUS 08/30/2008  . Diabetes ([place]) 03/25/2008  . MIGRAINE HEADACHE 12/25/2007  . Hypothyroidism 10/17/2007  . HYPERLIPIDEMIA 10/17/2007  . ANXIETY 10/17/2007  . DEPRESSION 10/17/2007  . Essential hypertension 10/17/2007  . VARICOSE VEIN 10/17/2007  . ASTHMA 10/17/2007  . OVERACTIVE BLADDER 10/17/2007  . CARDIAC MURMUR 10/17/2007  . NEOPLASM, MALIGNANT, BLADDER, HX OF 10/17/2007  . SKIN CANCER, HX OF 10/17/2007  . CATARACT, HX OF 10/17/2007    Orientation ACTIVITIES/SOCIAL BLADDER RESPIRATION    Self  Family  supportive Continent Normal  BEHAVIORAL SYMPTOMS/MOOD NEUROLOGICAL BOWEL NUTRITION STATUS      Continent Diet (Carb Modified)  PHYSICIAN VISITS COMMUNICATION OF NEEDS Height & Weight Skin    Verbally 5\' 4"  (162.6 cm) 161 lbs. Normal          AMBULATORY STATUS RESPIRATION    Supervision limited Normal      Personal Care Assistance Level of Assistance  Bathing, Feeding, Dressing Bathing Assistance: Limited assistance Feeding assistance: Limited assistance Dressing Assistance: Limited assistance      Functional Limitations Info                SPECIAL CARE FACTORS FREQUENCY                      Additional Factors Info  Psychotropic, Insulin Sliding Scale Code Status Info: DNR Allergies Info: Sulfonamide Derivatives Psychotropic Info:  (Ativan) Insulin Sliding Scale Info:  (3 times daily )       Current Medications (07/08/2015):  This is the current hospital active medication list Current Facility-Administered Medications  Medication Dose Route Frequency Provider Last Rate Last Dose  . 0.9 %  sodium chloride infusion   Intravenous Continuous Doree Albee, MD 125 mL/hr at 07/08/15 0521    . acetaminophen (TYLENOL) tablet 500-1,000 mg  500-1,000 mg Oral Q6H PRN Nimish C Gosrani, MD      . allopurinol (ZYLOPRIM) tablet 100 mg  100 mg Oral Daily Nimish C Anastasio Champion, MD   100 mg at 07/08/15 0748  . cefTRIAXone (ROCEPHIN) 1 g in dextrose 5 % 50 mL IVPB  1 g  Intravenous Q24H Doree Albee, MD   1 g at 07/07/15 1529  . donepezil (ARICEPT) tablet 10 mg  10 mg Oral QHS Doree Albee, MD   10 mg at 07/07/15 2157  . enoxaparin (LOVENOX) injection 40 mg  40 mg Subcutaneous Q24H Nimish C Anastasio Champion, MD   40 mg at 07/07/15 2157  . insulin aspart (novoLOG) injection 0-15 Units  0-15 Units Subcutaneous TID WC Nimish Luther Parody, MD   0 Units at 07/07/15 0800  . insulin aspart (novoLOG) injection 0-5 Units  0-5 Units Subcutaneous QHS Doree Albee, MD   0 Units at 07/06/15 2200   . levothyroxine (SYNTHROID, LEVOTHROID) tablet 112 mcg  112 mcg Oral QAC breakfast Doree Albee, MD   112 mcg at 07/08/15 0747  . linagliptin (TRADJENTA) tablet 5 mg  5 mg Oral Daily Nimish Luther Parody, MD   5 mg at 07/08/15 0748  . LORazepam (ATIVAN) tablet 1 mg  1 mg Oral Daily Nimish Luther Parody, MD   1 mg at 07/08/15 0748  . memantine (NAMENDA) tablet 10 mg  10 mg Oral BID Doree Albee, MD   10 mg at 07/08/15 0748  . risperiDONE (RISPERDAL) tablet 0.5 mg  0.5 mg Oral BID Doree Albee, MD   0.5 mg at 07/08/15 0748  . sodium chloride 0.9 % injection 3 mL  3 mL Intravenous Q12H Nimish C Anastasio Champion, MD   3 mL at 07/08/15 0749  . traZODone (DESYREL) tablet 25 mg  25 mg Oral QHS Doree Albee, MD   25 mg at 07/07/15 2157     Discharge Medications: Medication List    STOP taking these medications       atorvastatin 20 MG tablet  Commonly known as: LIPITOR     ciprofloxacin 250 MG tablet  Commonly known as: CIPRO     citalopram 10 MG tablet  Commonly known as: CELEXA      TAKE these medications       ACCU-CHEK AVIVA test strip  Generic drug: glucose blood  1 each by Other route as needed for other. Use as instructed     accu-chek soft touch lancets  1 each by Other route as needed for other. Use as instructed     acetaminophen 500 MG tablet  Commonly known as: TYLENOL  Take 500-1,000 mg by mouth every 6 (six) hours as needed (pain(Max 5 tablets per 24hours)).     acidophilus Caps capsule  Take 1 capsule by mouth daily.     allopurinol 100 MG tablet  Commonly known as: ZYLOPRIM  TAKE ONE TABLET BY MOUTH ONCE DAILY.     amitriptyline 75 MG tablet  Commonly known as: ELAVIL  TAKE (1) TABLET BY MOUTH AT BEDTIME.     cefUROXime 250 MG tablet  Commonly known as: CEFTIN  Take 1 tablet (250 mg total) by mouth 2 (two) times daily with a meal.     diltiazem 180 MG 24 hr capsule  Commonly known as:  CARDIZEM CD  Take 180 mg by mouth daily.     donepezil 10 MG tablet  Commonly known as: ARICEPT  Take 10 mg by mouth at bedtime.     levothyroxine 112 MCG tablet  Commonly known as: SYNTHROID, LEVOTHROID  Take 1 tablet (112 mcg total) by mouth daily.     linagliptin 5 MG Tabs tablet  Commonly known as: TRADJENTA  Take 5 mg by mouth daily.     LORazepam 2 MG tablet  Commonly known as: ATIVAN  Take 1 mg by mouth daily.     losartan-hydrochlorothiazide 100-12.5 MG tablet  Commonly known as: HYZAAR  TAKE ONE TABLET BY MOUTH ONCE DAILY.     NAMENDA 10 MG tablet  Generic drug: memantine  TAKE (1) TABLET BY MOUTH TWICE DAILY.     nystatin-triamcinolone cream  Commonly known as: MYCOLOG II  Apply 1 application topically 2 (two) times daily as needed (rash).     PRESERVISION/LUTEIN PO  Take 1 capsule by mouth 2 (two) times daily with a meal.     risperiDONE 0.5 MG tablet  Commonly known as: RISPERDAL  Take 0.5 mg by mouth 2 (two) times daily.     traZODone 50 MG tablet  Commonly known as: DESYREL  Take 25 mg by mouth at bedtime.     trimethoprim 100 MG tablet  Commonly known as: TRIMPEX  Take 1 tablet (100 mg total) by mouth daily.            Relevant Imaging Results:  Relevant Lab Results:  Recent Labs    Additional Information    Ihor Gully, LCSW [phone]

## 2015-07-08 NOTE — Care Management Important Message (Signed)
Important Message  Patient Details  Name: Marnie Plott MRN: [id number] Date of Birth: 10-Aug-1929   Medicare Important Message Given:  N/A - LOS <3 / Initial given by admissions    Sherald Barge, RN 07/08/2015, 9:00 AM

## 2015-07-08 NOTE — Clinical Social Work Note (Signed)
CSW facilitated discharge.  CSW notified Fabio Pierce, owner of [facility], and advised that patient was being discharged today.  CSW spoke with patient's daughter, Veto Kemps, who was at bedside, and advised that patient was being discharged. She stated that she planned on transporting patient back to facility.  CSW signing off.  Ihor Gully, [place] [phone]

## 2015-07-10 ENCOUNTER — Encounter (HOSPITAL_COMMUNITY): Payer: Self-pay

## 2015-07-10 ENCOUNTER — Emergency Department (HOSPITAL_COMMUNITY)
Admission: EM | Admit: 2015-07-10 | Discharge: 2015-07-10 | Disposition: A | Discharge status: Home / Self Care | Payer: Medicare Other | Attending: Emergency Medicine | Admitting: Emergency Medicine

## 2015-07-10 ENCOUNTER — Emergency Department (HOSPITAL_COMMUNITY): Payer: Medicare Other

## 2015-07-10 DIAGNOSIS — M1990 Unspecified osteoarthritis, unspecified site: Secondary | ICD-10-CM | POA: Diagnosis not present

## 2015-07-10 DIAGNOSIS — R079 Chest pain, unspecified: Secondary | ICD-10-CM | POA: Diagnosis not present

## 2015-07-10 DIAGNOSIS — Z79899 Other long term (current) drug therapy: Secondary | ICD-10-CM | POA: Insufficient documentation

## 2015-07-10 DIAGNOSIS — Z792 Long term (current) use of antibiotics: Secondary | ICD-10-CM | POA: Diagnosis not present

## 2015-07-10 DIAGNOSIS — F0280 Dementia in other diseases classified elsewhere without behavioral disturbance: Secondary | ICD-10-CM | POA: Insufficient documentation

## 2015-07-10 DIAGNOSIS — M109 Gout, unspecified: Secondary | ICD-10-CM | POA: Insufficient documentation

## 2015-07-10 DIAGNOSIS — G309 Alzheimer's disease, unspecified: Secondary | ICD-10-CM | POA: Insufficient documentation

## 2015-07-10 DIAGNOSIS — F329 Major depressive disorder, single episode, unspecified: Secondary | ICD-10-CM | POA: Insufficient documentation

## 2015-07-10 DIAGNOSIS — E039 Hypothyroidism, unspecified: Secondary | ICD-10-CM | POA: Diagnosis not present

## 2015-07-10 DIAGNOSIS — G43909 Migraine, unspecified, not intractable, without status migrainosus: Secondary | ICD-10-CM | POA: Insufficient documentation

## 2015-07-10 DIAGNOSIS — Z85820 Personal history of malignant melanoma of skin: Secondary | ICD-10-CM | POA: Diagnosis not present

## 2015-07-10 DIAGNOSIS — Z8551 Personal history of malignant neoplasm of bladder: Secondary | ICD-10-CM | POA: Insufficient documentation

## 2015-07-10 DIAGNOSIS — I4891 Unspecified atrial fibrillation: Secondary | ICD-10-CM | POA: Insufficient documentation

## 2015-07-10 DIAGNOSIS — I10 Essential (primary) hypertension: Secondary | ICD-10-CM | POA: Insufficient documentation

## 2015-07-10 LAB — BASIC METABOLIC PANEL
Anion gap: 9 (ref 5–15)
BUN: 9 mg/dL (ref 6–20)
CO2: 27 mmol/L (ref 22–32)
Calcium: 9.2 mg/dL (ref 8.9–10.3)
Chloride: 100 mmol/L — ABNORMAL LOW (ref 101–111)
Creatinine, Ser: 0.94 mg/dL (ref 0.44–1.00)
GFR calc Af Amer: >60 mL/min (ref >60)
GFR calc non Af Amer: 55 mL/min — ABNORMAL LOW (ref >60)
Glucose, Bld: 135 mg/dL — ABNORMAL HIGH (ref 65–99)
Potassium: 3.6 mmol/L (ref 3.5–5.1)
Sodium: 136 mmol/L (ref 135–145)

## 2015-07-10 LAB — CBC WITH DIFFERENTIAL/PLATELET
Basophils Absolute: 0 10*3/uL (ref 0.0–0.1)
Basophils Relative: 0 %
Eosinophils Absolute: 0.1 10*3/uL (ref 0.0–0.7)
Eosinophils Relative: 1 %
HCT: 38.1 % (ref 36.0–46.0)
Hemoglobin: 12.9 g/dL (ref 12.0–15.0)
Lymphocytes Relative: 24 %
Lymphs Abs: 1.7 10*3/uL (ref 0.7–4.0)
MCH: 30.2 pg (ref 26.0–34.0)
MCHC: 33.9 g/dL (ref 30.0–36.0)
MCV: 89.2 fL (ref 78.0–100.0)
Monocytes Absolute: 0.6 10*3/uL (ref 0.1–1.0)
Monocytes Relative: 9 %
Neutro Abs: 4.5 10*3/uL (ref 1.7–7.7)
Neutrophils Relative %: 66 %
Platelets: 174 10*3/uL (ref 150–400)
RBC: 4.27 MIL/uL (ref 3.87–5.11)
RDW: 14 % (ref 11.5–15.5)
WBC: 6.8 10*3/uL (ref 4.0–10.5)

## 2015-07-10 LAB — TROPONIN I: Troponin I: <0.03 ng/mL (ref <0.031)

## 2015-07-10 NOTE — ED Notes (Signed)
Son states his mom has been having chest pain and her heart rate has been going from the forty's up to 200

## 2015-07-10 NOTE — ED Notes (Signed)
Pt's family verbalized understanding of d/c instructions.  Forgot to sign before leaving.  Report called to [facility] at [facility] home.

## 2015-07-10 NOTE — Discharge Instructions (Signed)
Nonspecific Chest Pain  °Chest pain can be caused by many different conditions. There is always a chance that your pain could be related to something serious, such as a heart attack or a blood clot in your lungs. Chest pain can also be caused by conditions that are not life-threatening. If you have chest pain, it is very important to follow up with your health care provider. °CAUSES  °Chest pain can be caused by: °· Heartburn. °· Pneumonia or bronchitis. °· Anxiety or stress. °· Inflammation around your heart (pericarditis) or lung (pleuritis or pleurisy). °· A blood clot in your lung. °· A collapsed lung (pneumothorax). It can develop suddenly on its own (spontaneous pneumothorax) or from trauma to the chest. °· Shingles infection (varicella-zoster virus). °· Heart attack. °· Damage to the bones, muscles, and cartilage that make up your chest wall. This can include: °¨ Bruised bones due to injury. °¨ Strained muscles or cartilage due to frequent or repeated coughing or overwork. °¨ Fracture to one or more ribs. °¨ Sore cartilage due to inflammation (costochondritis). °RISK FACTORS  °Risk factors for chest pain may include: °· Activities that increase your risk for trauma or injury to your chest. °· Respiratory infections or conditions that cause frequent coughing. °· Medical conditions or overeating that can cause heartburn. °· Heart disease or family history of heart disease. °· Conditions or health behaviors that increase your risk of developing a blood clot. °· Having had chicken pox (varicella zoster). °SIGNS AND SYMPTOMS °Chest pain can feel like: °· Burning or tingling on the surface of your chest or deep in your chest. °· Crushing, pressure, aching, or squeezing pain. °· Dull or sharp pain that is worse when you move, cough, or take a deep breath. °· Pain that is also felt in your back, neck, shoulder, or arm, or pain that spreads to any of these areas. °Your chest pain may come and go, or it may stay  constant. °DIAGNOSIS °Lab tests or other studies may be needed to find the cause of your pain. Your health care provider may have you take a test called an ambulatory ECG (electrocardiogram). An ECG records your heartbeat patterns at the time the test is performed. You may also have other tests, such as: °· Transthoracic echocardiogram (TTE). During echocardiography, sound waves are used to create a picture of all of the heart structures and to look at how blood flows through your heart. °· Transesophageal echocardiogram (TEE). This is a more advanced imaging test that obtains images from inside your body. It allows your health care provider to see your heart in finer detail. °· Cardiac monitoring. This allows your health care provider to monitor your heart rate and rhythm in real time. °· Holter monitor. This is a portable device that records your heartbeat and can help to diagnose abnormal heartbeats. It allows your health care provider to track your heart activity for several days, if needed. °· Stress tests. These can be done through exercise or by taking medicine that makes your heart beat more quickly. °· Blood tests. °· Imaging tests. °TREATMENT  °Your treatment depends on what is causing your chest pain. Treatment may include: °· Medicines. These may include: °¨ Acid blockers for heartburn. °¨ Anti-inflammatory medicine. °¨ Pain medicine for inflammatory conditions. °¨ Antibiotic medicine, if an infection is present. °¨ Medicines to dissolve blood clots. °¨ Medicines to treat coronary artery disease. °· Supportive care for conditions that do not require medicines. This may include: °¨ Resting. °¨ Applying heat   or cold packs to injured areas. °¨ Limiting activities until pain decreases. °HOME CARE INSTRUCTIONS °· If you were prescribed an antibiotic medicine, finish it all even if you start to feel better. °· Avoid any activities that bring on chest pain. °· Do not use any tobacco products, including  cigarettes, chewing tobacco, or electronic cigarettes. If you need help quitting, ask your health care provider. °· Do not drink alcohol. °· Take medicines only as directed by your health care provider. °· Keep all follow-up visits as directed by your health care provider. This is important. This includes any further testing if your chest pain does not go away. °· If heartburn is the cause for your chest pain, you may be told to keep your head raised (elevated) while sleeping. This reduces the chance that acid will go from your stomach into your esophagus. °· Make lifestyle changes as directed by your health care provider. These may include: °¨ Getting regular exercise. Ask your health care provider to suggest some activities that are safe for you. °¨ Eating a heart-healthy diet. A registered dietitian can help you to learn healthy eating options. °¨ Maintaining a healthy weight. °¨ Managing diabetes, if necessary. °¨ Reducing stress. °SEEK MEDICAL CARE IF: °· Your chest pain does not go away after treatment. °· You have a rash with blisters on your chest. °· You have a fever. °SEEK IMMEDIATE MEDICAL CARE IF:  °· Your chest pain is worse. °· You have an increasing cough, or you cough up blood. °· You have severe abdominal pain. °· You have severe weakness. °· You faint. °· You have chills. °· You have sudden, unexplained chest discomfort. °· You have sudden, unexplained discomfort in your arms, back, neck, or jaw. °· You have shortness of breath at any time. °· You suddenly start to sweat, or your skin gets clammy. °· You feel nauseous or you vomit. °· You suddenly feel light-headed or dizzy. °· Your heart begins to beat quickly, or it feels like it is skipping beats. °These symptoms may represent a serious problem that is an emergency. Do not wait to see if the symptoms will go away. Get medical help right away. Call your local emergency services (911 in the [place] Do not drive yourself to the hospital. °  °This  information is not intended to replace advice given to you by your health care provider. Make sure you discuss any questions you have with your health care provider. °  °Document Released: 05/02/2005 Document Revised: 08/13/2014 Document Reviewed: 02/26/2014 °Elsevier Interactive Patient Education ©2016 [employer] Inc[employer] ° °

## 2015-07-10 NOTE — ED Notes (Signed)
Pt has remained asleep with family at bedside.  No complaints of chest pain since arrival.

## 2015-07-10 NOTE — ED Provider Notes (Signed)
CSN: [id number]     Arrival date & time 07/10/15  1306 History   First MD Initiated Contact with Patient 07/10/15 1320     Chief Complaint  Patient presents with  . Chest Pain     (Consider location/radiation/quality/duration/timing/severity/associated sxs/prior Treatment) HPI   Female brought in by children for evaluation after complaining some chest pain earlier today. She was seated while eating when she began to complain of some pain in her chest. HR taken at that time and apparently noted to range between 40 and 200. Unclear how exactly this was taken. She was not coughing/choking.   Past Medical History  Diagnosis Date  . Thyroid disease   . Hypertension   . Gout   . Dementia   . Vertigo   . Low back pain   . Hypothyroidism   . Arthritis   . Hyperlipidemia   . Hemorrhoids with complication   . Cancer [facility]) 2012    Melanoma-Face Removed at [facility]  . Bladder cancer [facility])     saw dr. Rosana Hoes had surgery had follow about 3 years ago  . Atrial fibrillation ([place])   . Migraine   . Depression   . Hyperglycemia   . Alzheimer disease   . Atrial fibrillation [facility])    Past Surgical History  Procedure Laterality Date  . Cholecystectomy    . Colonoscopy  11/21/09    Simple adenoma/pancolonic diverticulosis(next on due in 5/10 years)  . Eye surgery      Bilateral Cataract Surgery  . Flexible sigmoidoscopy  12/03/2011    Procedure: FLEXIBLE SIGMOIDOSCOPY;  Surgeon: Danie Binder, MD;  Location: [facility];  Service: Endoscopy;  Laterality: N/A;  12:00  . Bladder surgery      remove cancer  . Melanoma excision      on face  . Dementia     Family History  Problem Relation Age of Onset  . Migraines Mother   . Arthritis Father   . Diabetes Father   . Cancer Brother   . Diabetes Brother   . Breast cancer Daughter   . Diabetes Brother   . Dementia Neg Hx    Social History  Substance Use Topics  . Smoking status: Never Smoker   . Smokeless tobacco: Never  Used  . Alcohol Use: No   OB History    No data available     Review of Systems  Lev el 5 caveat because of dementia.   Allergies  Sulfonamide derivatives  Home Medications   Prior to Admission medications   Medication Sig Start Date End Date Taking? Authorizing Provider  acetaminophen (TYLENOL) 500 MG tablet Take 500-1,000 mg by mouth every 6 (six) hours as needed (pain(Max 5 tablets per 24hours)).     Historical Provider, MD  acidophilus (RISAQUAD) CAPS capsule Take 1 capsule by mouth daily.    Historical Provider, MD  allopurinol (ZYLOPRIM) 100 MG tablet TAKE ONE TABLET BY MOUTH ONCE DAILY. 03/24/13   Kathyrn Drown, MD  amitriptyline (ELAVIL) 75 MG tablet TAKE (1) TABLET BY MOUTH AT BEDTIME. Patient not taking: Reported on 07/06/2015 06/22/13   Kathyrn Drown, MD  cefUROXime (CEFTIN) 250 MG tablet Take 1 tablet (250 mg total) by mouth 2 (two) times daily with a meal. 07/08/15   Sinda Du, MD  diltiazem (CARDIZEM CD) 180 MG 24 hr capsule Take 180 mg by mouth daily. 04/29/15   Historical Provider, MD  donepezil (ARICEPT) 10 MG tablet Take 10 mg by mouth at  bedtime.    Historical Provider, MD  glucose blood (ACCU-CHEK AVIVA) test strip 1 each by Other route as needed for other. Use as instructed    Historical Provider, MD  Lancets (ACCU-CHEK SOFT TOUCH) lancets 1 each by Other route as needed for other. Use as instructed    Historical Provider, MD  levothyroxine (SYNTHROID, LEVOTHROID) 112 MCG tablet Take 1 tablet (112 mcg total) by mouth daily. 11/03/13   Kathyrn Drown, MD  linagliptin (TRADJENTA) 5 MG TABS tablet Take 5 mg by mouth daily.    Historical Provider, MD  losartan-hydrochlorothiazide (HYZAAR) 100-12.5 MG per tablet TAKE ONE TABLET BY MOUTH ONCE DAILY. 03/24/13   Kathyrn Drown, MD  Multiple Vitamins-Minerals (PRESERVISION/LUTEIN PO) Take 1 capsule by mouth 2 (two) times daily with a meal.    Historical Provider, MD  NAMENDA 10 MG tablet TAKE (1) TABLET BY MOUTH TWICE  DAILY. 03/24/13   Kathyrn Drown, MD  nystatin-triamcinolone (MYCOLOG II) cream Apply 1 application topically 2 (two) times daily as needed (rash).  12/11/12   Historical Provider, MD  risperiDONE (RISPERDAL) 0.5 MG tablet Take 0.5 mg by mouth 2 (two) times daily.     Historical Provider, MD  traZODone (DESYREL) 50 MG tablet Take 25 mg by mouth at bedtime.    Historical Provider, MD  trimethoprim (TRIMPEX) 100 MG tablet Take 1 tablet (100 mg total) by mouth daily. 03/08/15   Sinda Du, MD   BP 91/52 mmHg  Pulse 57  Temp(Src) 97.8 F (36.6 C) (Oral)  Resp 12  Ht 5\' 5"  (1.651 m)  Wt 160 lb (72.576 kg)  BMI 26.63 kg/m2  SpO2 92% Physical Exam  Constitutional: She appears well-developed and well-nourished. No distress.  HENT:  Head: Normocephalic and atraumatic.  Eyes: Conjunctivae are normal. Right eye exhibits no discharge. Left eye exhibits no discharge.  Neck: Neck supple.  Cardiovascular: Normal rate, regular rhythm and normal heart sounds.  Exam reveals no gallop and no friction rub.   No murmur heard. Pulmonary/Chest: Effort normal and breath sounds normal. No respiratory distress.  Abdominal: Soft. She exhibits no distension. There is no tenderness.  Musculoskeletal: She exhibits no edema or tenderness.  Lower extremities symmetric as compared to each other. No calf tenderness. Negative Homan's. No palpable cords.   Neurological:  laying in bed with eyes closed. Opens them to voice. Answers questions, but confused. Follows simple commands.    Skin: Skin is warm and dry.  Nursing note and vitals reviewed.   ED Course  Procedures (including critical care time) Labs Review Labs Reviewed  BASIC METABOLIC PANEL - Abnormal; Notable for the following:    Chloride 100 (*)    Glucose, Bld 135 (*)    GFR calc non Af Amer 55 (*)    All other components within normal limits  CBC WITH DIFFERENTIAL/PLATELET  TROPONIN I    Imaging Review No results found. I have personally  reviewed and evaluated these images and lab results as part of my medical decision-making.   EKG Interpretation   Date/Time:  Sunday July 10 2015 13:14:12 EST Ventricular Rate:  60 PR Interval:  156 QRS Duration: 99 QT Interval:  398 QTC Calculation: 398 R Axis:   58 Text Interpretation:  Sinus rhythm Atrial premature complexes RSR' in V1  or V2, right VCD or RVH Baseline wander in lead(s) V6 Confirmed by ZAVITZ   MD, JOSHUA ([id number]) on 07/10/2015 1:20:12 PM      MDM   Final diagnoses:  Chest  pain, unspecified chest pain type    85yF with chest pain? Cannot get reliable ROS, but apparently complaining of this earlier. Also reported HR from 40-200? Sinus in ED with some PACs. Rate ~60-70. Doesn't seem distressed. Unremarkable w/u. It has been determined that no acute conditions requiring further emergency intervention are present at this time. The patient's family has been advised of the diagnosis and plan. I reviewed any labs and imaging including any potential incidental findings. We have discussed signs and symptoms that warrant return to the ED and they are listed in the discharge instructions.      Virgel Manifold, MD 07/14/15 [phone]

## 2015-10-31 ENCOUNTER — Ambulatory Visit: Payer: Medicare Other | Admitting: Neurology

## 2017-01-04 DEATH — deceased
